# Patient Record
Sex: Male | Born: 1945 | Race: White | Hispanic: No | Marital: Married | Smoking: Former smoker
Health system: Southern US, Community
[De-identification: ages and names within clinical notes are randomized; demographics above are authoritative.]

## PROBLEM LIST (undated history)

## (undated) DIAGNOSIS — Z87442 Personal history of urinary calculi: Secondary | ICD-10-CM

## (undated) DIAGNOSIS — F419 Anxiety disorder, unspecified: Secondary | ICD-10-CM

## (undated) DIAGNOSIS — N486 Induration penis plastica: Secondary | ICD-10-CM

## (undated) DIAGNOSIS — F329 Major depressive disorder, single episode, unspecified: Secondary | ICD-10-CM

## (undated) DIAGNOSIS — M1990 Unspecified osteoarthritis, unspecified site: Secondary | ICD-10-CM

## (undated) DIAGNOSIS — K5730 Diverticulosis of large intestine without perforation or abscess without bleeding: Secondary | ICD-10-CM

## (undated) DIAGNOSIS — K635 Polyp of colon: Secondary | ICD-10-CM

## (undated) DIAGNOSIS — I10 Essential (primary) hypertension: Secondary | ICD-10-CM

## (undated) DIAGNOSIS — F32A Depression, unspecified: Secondary | ICD-10-CM

## (undated) DIAGNOSIS — M542 Cervicalgia: Secondary | ICD-10-CM

## (undated) DIAGNOSIS — N320 Bladder-neck obstruction: Secondary | ICD-10-CM

## (undated) DIAGNOSIS — M549 Dorsalgia, unspecified: Secondary | ICD-10-CM

## (undated) DIAGNOSIS — E785 Hyperlipidemia, unspecified: Secondary | ICD-10-CM

## (undated) HISTORY — PX: CIRCUMCISION: SUR203

## (undated) HISTORY — DX: Diverticulosis of large intestine without perforation or abscess without bleeding: K57.30

## (undated) HISTORY — DX: Induration penis plastica: N48.6

## (undated) HISTORY — PX: SHOULDER SURGERY: SHX246

## (undated) HISTORY — PX: COLONOSCOPY: SHX174

## (undated) HISTORY — PX: CERVICAL SPINE SURGERY: SHX589

## (undated) HISTORY — PX: NOSE SURGERY: SHX723

## (undated) HISTORY — DX: Bladder-neck obstruction: N32.0

---

## 1998-11-17 ENCOUNTER — Emergency Department (HOSPITAL_COMMUNITY): Admission: EM | Admit: 1998-11-17 | Discharge: 1998-11-18 | Payer: Self-pay | Admitting: Emergency Medicine

## 2001-03-26 ENCOUNTER — Encounter (INDEPENDENT_AMBULATORY_CARE_PROVIDER_SITE_OTHER): Payer: Self-pay | Admitting: *Deleted

## 2001-03-26 ENCOUNTER — Ambulatory Visit (HOSPITAL_COMMUNITY): Admission: RE | Admit: 2001-03-26 | Discharge: 2001-03-26 | Payer: Self-pay | Admitting: Gastroenterology

## 2002-04-24 ENCOUNTER — Emergency Department (HOSPITAL_COMMUNITY): Admission: EM | Admit: 2002-04-24 | Discharge: 2002-04-24 | Payer: Self-pay | Admitting: Emergency Medicine

## 2002-04-24 ENCOUNTER — Encounter: Payer: Self-pay | Admitting: Emergency Medicine

## 2002-05-06 ENCOUNTER — Ambulatory Visit (HOSPITAL_COMMUNITY): Admission: RE | Admit: 2002-05-06 | Discharge: 2002-05-06 | Payer: Self-pay

## 2005-01-17 ENCOUNTER — Ambulatory Visit: Payer: Self-pay

## 2005-02-27 ENCOUNTER — Ambulatory Visit (HOSPITAL_COMMUNITY): Admission: RE | Admit: 2005-02-27 | Discharge: 2005-02-27 | Payer: Self-pay | Admitting: Gastroenterology

## 2005-02-27 ENCOUNTER — Encounter (INDEPENDENT_AMBULATORY_CARE_PROVIDER_SITE_OTHER): Payer: Self-pay | Admitting: Specialist

## 2005-03-14 ENCOUNTER — Emergency Department (HOSPITAL_COMMUNITY): Admission: EM | Admit: 2005-03-14 | Discharge: 2005-03-14 | Payer: Self-pay | Admitting: Emergency Medicine

## 2007-03-06 ENCOUNTER — Ambulatory Visit (HOSPITAL_COMMUNITY): Admission: RE | Admit: 2007-03-06 | Discharge: 2007-03-06 | Payer: Self-pay

## 2009-02-23 ENCOUNTER — Encounter: Admission: RE | Admit: 2009-02-23 | Discharge: 2009-02-23 | Payer: Self-pay | Admitting: Emergency Medicine

## 2011-02-01 NOTE — Procedures (Signed)
[place]. [facility]  Patient:    Lawrence Parker, Lawrence Parker                     MRN: [id number] Proc. Date: 03/26/01 Adm. Date:  [id number] Attending:  Nelda Marseille CC:         Barron Alvine, M.D.   Procedure Report  PROCEDURE:  Colonoscopy with polypectomy.  INDICATIONS:  Family history of colon cancer and colon polyps.  Consent was signed after risks, benefits, methods, and options were thoroughly discussed in the office.  MEDICATIONS:  Demerol 100, Versed 10.  PROCEDURE:  Rectal inspection was pertinent for small external hemorrhoids. Digital examination was negative.  Video colonoscope was inserted, easily advanced around the colon to the cecum.  This did require abdominal pressure on the left side, but no position changes.  Cecum was identified by the appendiceal orifice and ileocecal valve.  No obvious abnormality was seen on insertion.  In fact, the scope inserted a short ways in the terminal ileum, which was normal.  Photo documentation was obtained.  The scope was slowly withdrawn.  Prep was adequate.  There was some liquid stool that required washing and suctioning.  On slow withdrawal through the colon, there was some scattered right and left-sided diverticuli.  As the scope was withdrawn around the colon in the more distal transverse, a tiny questionable polyp was seen and hot biopsied x 1.  At the approximate level of the sigmoid/descending junction, a 3 mm polyp was seen and hot biopsied x 2.  As the scope was withdrawn to the rectum, another tiny polyp was seen and hot biopsied x 1. They were all put in the same container.  Other than the occasional diverticuli, no other abnormalities seen.  Once back in the rectum, the scope was retroflexed, pertinent for some internal hemorrhoids.  Scope was straightened, readvanced a short ways up the sigmoid, air was suctioned, scope removed. The patient tolerated the procedure well.  There was no  obvious immediate complication.  ENDOSCOPIC DIAGNOSES: 1. Internal and external hemorrhoids. 2. Occasional right and left diverticuli. 3. Three tiny to small polyps in the transverse, sigmoid/descending junction,    and rectum, status post hot biopsy. 4. Otherwise within normal limits to the terminal ileum.  PLAN:  Await pathology to determine future colonic screening.  One week customary postpolypectomy instructions.  Happy to see back p.r.n., otherwise will recommend yearly rectals and guaiacs for preventative maintenance per primary care physician or happy to see back or mail guaiac cards if need be. DD:  03/26/01 TD:  03/26/01 Job: [id number]

## 2011-02-01 NOTE — Op Note (Signed)
Lawrence Parker, Lawrence Parker              ACCOUNT NO.:  [id number]   MEDICAL RECORD NO.:  [id number]          PATIENT TYPE:  AMB   LOCATION:  ENDO                         FACILITY:  [facility]   PHYSICIAN:  Petra Kuba, M.D.    DATE OF BIRTH:  04-04-1946   DATE OF PROCEDURE:  02/27/2005  DATE OF DISCHARGE:                                 OPERATIVE REPORT   PROCEDURE:  Colonoscopy with hot biopsy.   ENDOSCOPIST:  Petra Kuba, M.D.   INDICATIONS:  Patient with personal history of colon polyps, family history  of colon cancer, due for repeat screening.  Consent was signed after risks,  benefits, methods, and options were thoroughly discussed multiple times in  the past.   MEDICINES USED:  Demerol 70 mg, Versed 7 mg.   PROCEDURE:  Rectal inspection was pertinent for external hemorrhoids, small.  Digital exam was negative.  Video pediatric adjustable colonoscope was  inserted and fairly easily advanced around the colon to the cecum.  On  insertion in the distal transverse, a tiny polyp was seen and was hot-  biopsied x2.  To advance to the cecum did require some abdominal pressure  but no position changes.  Other than some left-sided diverticula, no other  abnormalities were seen on insertion.  The cecum was identified by the  appendiceal orifice and ileocecal valve.  The prep was fairly adequate.  He  did have some formed stool in the colon that could be washed to different  areas; some was adherent to the wall; lots of washing and suctioning were  done, but on slow withdrawal through the colon, other than a tiny polyp hot-  biopsied on insertion and the left-sided diverticula. no other abnormalities  were seen.  The polyp on withdrawal had a nice white coagulum without any  residual obvious polypoid tissue.  Anorectal pull-through and retroflexion  back in the rectum were pertinent for some small hemorrhoids.  There was  some formed stool in the rectum and distal sigmoid which could not be  suctioned.  Air was suctioned after re-advancing a short ways up the left  side of the colon and scope removed.  The patient tolerated the procedure  well.  There was no obvious immediate complication.   ENDOSCOPIC DIAGNOSES:  1.  Internal/external small hemorrhoids.  2.  Some left-sided diverticula.  3.  Distal transverse tiny polyp, hot-biopsied.  4.  Otherwise within normal limits to the cecum.   PLAN:  Await pathology.  Would probably recheck colon screening in 5 years.  Happy to see back p.r.n., otherwise return care to Dr. Vear Clock for the  customary healthcare maintenance to include yearly rectals and guaiacs.       MEM/MEDQ  D:  02/27/2005  T:  02/27/2005  Job:  [id number]   cc:   Loma Sender  [address]  [place]  [place] [address]  Fax: [phone]

## 2011-09-27 ENCOUNTER — Other Ambulatory Visit: Payer: Self-pay | Admitting: Neurological Surgery

## 2011-09-27 ENCOUNTER — Other Ambulatory Visit: Payer: Self-pay | Admitting: Internal Medicine

## 2011-09-27 DIAGNOSIS — M47812 Spondylosis without myelopathy or radiculopathy, cervical region: Secondary | ICD-10-CM

## 2011-09-27 DIAGNOSIS — M47816 Spondylosis without myelopathy or radiculopathy, lumbar region: Secondary | ICD-10-CM

## 2011-10-05 ENCOUNTER — Ambulatory Visit
Admission: RE | Admit: 2011-10-05 | Discharge: 2011-10-05 | Disposition: A | Discharge status: Home / Self Care | Payer: 59 | Source: Ambulatory Visit | Attending: Neurological Surgery | Admitting: Neurological Surgery

## 2011-10-05 DIAGNOSIS — M47816 Spondylosis without myelopathy or radiculopathy, lumbar region: Secondary | ICD-10-CM

## 2011-10-05 DIAGNOSIS — M47812 Spondylosis without myelopathy or radiculopathy, cervical region: Secondary | ICD-10-CM

## 2011-10-28 ENCOUNTER — Other Ambulatory Visit: Payer: Self-pay | Admitting: Neurological Surgery

## 2011-11-05 ENCOUNTER — Encounter (HOSPITAL_COMMUNITY): Payer: Self-pay | Admitting: Pharmacy Technician

## 2011-11-08 ENCOUNTER — Other Ambulatory Visit: Payer: Self-pay

## 2011-11-08 ENCOUNTER — Encounter (HOSPITAL_COMMUNITY): Payer: Self-pay

## 2011-11-08 ENCOUNTER — Ambulatory Visit (HOSPITAL_COMMUNITY)
Admission: RE | Admit: 2011-11-08 | Discharge: 2011-11-08 | Disposition: A | Discharge status: Home / Self Care | Payer: 59 | Source: Ambulatory Visit | Attending: Anesthesiology | Admitting: Anesthesiology

## 2011-11-08 ENCOUNTER — Encounter (HOSPITAL_COMMUNITY)
Admission: RE | Admit: 2011-11-08 | Discharge: 2011-11-08 | Disposition: A | Discharge status: Home / Self Care | Payer: 59 | Source: Ambulatory Visit | Attending: Neurological Surgery | Admitting: Neurological Surgery

## 2011-11-08 DIAGNOSIS — Z01818 Encounter for other preprocedural examination: Secondary | ICD-10-CM | POA: Insufficient documentation

## 2011-11-08 DIAGNOSIS — I10 Essential (primary) hypertension: Secondary | ICD-10-CM | POA: Insufficient documentation

## 2011-11-08 DIAGNOSIS — Z01812 Encounter for preprocedural laboratory examination: Secondary | ICD-10-CM | POA: Insufficient documentation

## 2011-11-08 HISTORY — DX: Dorsalgia, unspecified: M54.9

## 2011-11-08 HISTORY — DX: Major depressive disorder, single episode, unspecified: F32.9

## 2011-11-08 HISTORY — DX: Anxiety disorder, unspecified: F41.9

## 2011-11-08 HISTORY — DX: Essential (primary) hypertension: I10

## 2011-11-08 HISTORY — DX: Hyperlipidemia, unspecified: E78.5

## 2011-11-08 HISTORY — DX: Personal history of urinary calculi: Z87.442

## 2011-11-08 HISTORY — DX: Depression, unspecified: F32.A

## 2011-11-08 HISTORY — DX: Cervicalgia: M54.2

## 2011-11-08 HISTORY — DX: Polyp of colon: K63.5

## 2011-11-08 HISTORY — DX: Unspecified osteoarthritis, unspecified site: M19.90

## 2011-11-08 LAB — SURGICAL PCR SCREEN
MRSA, PCR: NEGATIVE
Staphylococcus aureus: NEGATIVE

## 2011-11-08 LAB — CBC
MCH: 28.3 pg (ref 26.0–34.0)
Platelets: 149 10*3/uL — ABNORMAL LOW (ref 150–400)
RBC: 5.01 MIL/uL (ref 4.22–5.81)

## 2011-11-08 LAB — BASIC METABOLIC PANEL
Calcium: 9.8 mg/dL (ref 8.4–10.5)
GFR calc non Af Amer: >90 mL/min (ref >90)
Glucose, Bld: 354 mg/dL — ABNORMAL HIGH (ref 70–99)
Sodium: 133 mEq/L — ABNORMAL LOW (ref 135–145)

## 2011-11-08 NOTE — Progress Notes (Signed)
Pt doesn't have a cardiologist;maintained by medical MD Dr.Charles Vear Clock in [place]  For HTN/hyperlipidemia  Denies having a stress test/echo/heart cath

## 2011-11-08 NOTE — Progress Notes (Signed)
Fasting sugar runs about 110

## 2011-11-08 NOTE — Pre-Procedure Instructions (Signed)
20 KIP CROPP  11/08/2011   Your procedure is scheduled on:  Fri, Mar 1 @ 0730  Report to [facility] at 0530 AM.  Call this number if you have problems the morning of surgery: [phone]   Remember:   Do not eat food:After Midnight.  May have clear liquids: up to 4 Hours before arrival.  Clear liquids include soda, tea, black coffee, apple or grape juice, broth.  Take these medicines the morning of surgery with A SIP OF WATER: Xanax and Amolodipine   Do not wear jewelry, make-up or nail polish.  Do not wear lotions, powders, or perfumes. You may wear deodorant.  Do not shave 48 hours prior to surgery.  Do not bring valuables to the hospital.  Contacts, dentures or bridgework may not be worn into surgery.  Leave suitcase in the car. After surgery it may be brought to your room.  For patients admitted to the hospital, checkout time is 11:00 AM the day of discharge.   Patients discharged the day of surgery will not be allowed to drive home.  Name and phone number of your driver:   Special Instructions: CHG Shower Use Special Wash: 1/2 bottle night before surgery and 1/2 bottle morning of surgery.   Please read over the following fact sheets that you were given: Pain Booklet, Coughing and Deep Breathing, MRSA Information and Surgical Site Infection Prevention

## 2011-11-14 MED ORDER — CEFAZOLIN SODIUM-DEXTROSE 2-3 GM-% IV SOLR
2.0000 g | INTRAVENOUS | Status: AC
  Administered 2011-11-15: 2 g via INTRAVENOUS
  Filled 2011-11-14: qty 50

## 2011-11-15 ENCOUNTER — Encounter (HOSPITAL_COMMUNITY): Payer: Self-pay | Admitting: Certified Registered"

## 2011-11-15 ENCOUNTER — Encounter (HOSPITAL_COMMUNITY)
Admission: RE | Disposition: A | Discharge status: Home / Self Care | Payer: Self-pay | Source: Ambulatory Visit | Attending: Neurological Surgery

## 2011-11-15 ENCOUNTER — Encounter (HOSPITAL_COMMUNITY): Payer: Self-pay | Admitting: Anesthesiology

## 2011-11-15 ENCOUNTER — Encounter (HOSPITAL_COMMUNITY): Payer: Self-pay | Admitting: *Deleted

## 2011-11-15 ENCOUNTER — Ambulatory Visit (HOSPITAL_COMMUNITY): Payer: 59 | Admitting: Certified Registered"

## 2011-11-15 ENCOUNTER — Ambulatory Visit (HOSPITAL_COMMUNITY): Payer: 59

## 2011-11-15 ENCOUNTER — Inpatient Hospital Stay (HOSPITAL_COMMUNITY)
Admission: RE | Admit: 2011-11-15 | Discharge: 2011-11-16 | DRG: 473 | Disposition: A | Discharge status: Home / Self Care | Payer: 59 | Source: Ambulatory Visit | Attending: Neurological Surgery | Admitting: Neurological Surgery

## 2011-11-15 DIAGNOSIS — E119 Type 2 diabetes mellitus without complications: Secondary | ICD-10-CM | POA: Diagnosis present

## 2011-11-15 DIAGNOSIS — Z794 Long term (current) use of insulin: Secondary | ICD-10-CM

## 2011-11-15 DIAGNOSIS — Z7982 Long term (current) use of aspirin: Secondary | ICD-10-CM

## 2011-11-15 DIAGNOSIS — M4802 Spinal stenosis, cervical region: Secondary | ICD-10-CM | POA: Diagnosis present

## 2011-11-15 DIAGNOSIS — M4712 Other spondylosis with myelopathy, cervical region: Principal | ICD-10-CM | POA: Diagnosis present

## 2011-11-15 LAB — GLUCOSE, CAPILLARY: Glucose-Capillary: 129 mg/dL — ABNORMAL HIGH (ref 70–99)

## 2011-11-15 SURGERY — ANTERIOR CERVICAL DECOMPRESSION/DISCECTOMY FUSION 4 LEVELS
Anesthesia: General | Site: Neck | Wound class: Clean

## 2011-11-15 MED ORDER — 0.9 % SODIUM CHLORIDE (POUR BTL) OPTIME
TOPICAL | Status: DC | PRN
  Administered 2011-11-15: 1000 mL

## 2011-11-15 MED ORDER — PHENYLEPHRINE HCL 10 MG/ML IJ SOLN
INTRAMUSCULAR | Status: DC | PRN
  Administered 2011-11-15: 80 ug via INTRAVENOUS
  Administered 2011-11-15 (×2): 40 ug via INTRAVENOUS
  Administered 2011-11-15 (×2): 80 ug via INTRAVENOUS

## 2011-11-15 MED ORDER — SODIUM CHLORIDE 0.9 % IJ SOLN
3.0000 mL | Freq: Two times a day (BID) | INTRAMUSCULAR | Status: DC
  Administered 2011-11-15: 3 mL via INTRAVENOUS

## 2011-11-15 MED ORDER — SIMVASTATIN 20 MG PO TABS
20.0000 mg | ORAL_TABLET | Freq: Every evening | ORAL | Status: DC
  Administered 2011-11-15: 20 mg via ORAL
  Filled 2011-11-15 (×2): qty 1

## 2011-11-15 MED ORDER — HYDROCHLOROTHIAZIDE 25 MG PO TABS
25.0000 mg | ORAL_TABLET | Freq: Every day | ORAL | Status: DC
  Administered 2011-11-15 – 2011-11-16 (×2): 25 mg via ORAL
  Filled 2011-11-15 (×2): qty 1

## 2011-11-15 MED ORDER — ROCURONIUM BROMIDE 100 MG/10ML IV SOLN
INTRAVENOUS | Status: DC | PRN
  Administered 2011-11-15: 50 mg via INTRAVENOUS

## 2011-11-15 MED ORDER — ALUM & MAG HYDROXIDE-SIMETH 200-200-20 MG/5ML PO SUSP: 30.0000 mL | Freq: Four times a day (QID) | ORAL | Status: DC | PRN

## 2011-11-15 MED ORDER — ONDANSETRON HCL 4 MG/2ML IJ SOLN: 4.0000 mg | Freq: Once | INTRAMUSCULAR | Status: DC | PRN

## 2011-11-15 MED ORDER — LACTATED RINGERS IV SOLN
INTRAVENOUS | Status: DC | PRN
  Administered 2011-11-15 (×4): via INTRAVENOUS

## 2011-11-15 MED ORDER — LIDOCAINE HCL (CARDIAC) 20 MG/ML IV SOLN
INTRAVENOUS | Status: DC | PRN
  Administered 2011-11-15: 100 mg via INTRAVENOUS

## 2011-11-15 MED ORDER — GLYCOPYRROLATE 0.2 MG/ML IJ SOLN
INTRAMUSCULAR | Status: DC | PRN
  Administered 2011-11-15: .4 mg via INTRAVENOUS

## 2011-11-15 MED ORDER — BACITRACIN 50000 UNITS IM SOLR
INTRAMUSCULAR | Status: AC
  Filled 2011-11-15: qty 1

## 2011-11-15 MED ORDER — MORPHINE SULFATE 4 MG/ML IJ SOLN: 1.0000 mg | INTRAMUSCULAR | Status: DC | PRN

## 2011-11-15 MED ORDER — SODIUM CHLORIDE 0.9 % IV SOLN: 250.0000 mL | INTRAVENOUS | Status: DC

## 2011-11-15 MED ORDER — HYDROMORPHONE HCL PF 1 MG/ML IJ SOLN
INTRAMUSCULAR | Status: AC
  Filled 2011-11-15: qty 1

## 2011-11-15 MED ORDER — ACETAMINOPHEN 325 MG PO TABS: 650.0000 mg | ORAL_TABLET | ORAL | Status: DC | PRN

## 2011-11-15 MED ORDER — PIOGLITAZONE HCL 15 MG PO TABS
15.0000 mg | ORAL_TABLET | Freq: Every day | ORAL | Status: DC
  Administered 2011-11-15 – 2011-11-16 (×2): 15 mg via ORAL
  Filled 2011-11-15 (×2): qty 1

## 2011-11-15 MED ORDER — ONDANSETRON HCL 4 MG/2ML IJ SOLN
INTRAMUSCULAR | Status: DC | PRN
  Administered 2011-11-15: 4 mg via INTRAVENOUS

## 2011-11-15 MED ORDER — ONDANSETRON HCL 4 MG/2ML IJ SOLN: 4.0000 mg | INTRAMUSCULAR | Status: DC | PRN

## 2011-11-15 MED ORDER — PHENOL 1.4 % MT LIQD: 1.0000 | OROMUCOSAL | Status: DC | PRN

## 2011-11-15 MED ORDER — ACETAMINOPHEN 650 MG RE SUPP: 650.0000 mg | RECTAL | Status: DC | PRN

## 2011-11-15 MED ORDER — MEPERIDINE HCL 25 MG/ML IJ SOLN: 6.2500 mg | INTRAMUSCULAR | Status: DC | PRN

## 2011-11-15 MED ORDER — MORPHINE SULFATE 2 MG/ML IJ SOLN: 4.0000 mg | INTRAMUSCULAR | Status: DC | PRN

## 2011-11-15 MED ORDER — NEOSTIGMINE METHYLSULFATE 1 MG/ML IJ SOLN
INTRAMUSCULAR | Status: DC | PRN
  Administered 2011-11-15: 3 mg via INTRAVENOUS

## 2011-11-15 MED ORDER — AMLODIPINE BESYLATE 10 MG PO TABS
10.0000 mg | ORAL_TABLET | Freq: Every day | ORAL | Status: DC
  Administered 2011-11-16: 10 mg via ORAL
  Filled 2011-11-15 (×2): qty 1

## 2011-11-15 MED ORDER — SODIUM CHLORIDE 0.9 % IJ SOLN: 3.0000 mL | INTRAMUSCULAR | Status: DC | PRN

## 2011-11-15 MED ORDER — METFORMIN HCL 500 MG PO TABS
1000.0000 mg | ORAL_TABLET | Freq: Two times a day (BID) | ORAL | Status: DC
  Administered 2011-11-15: 1000 mg via ORAL
  Administered 2011-11-16: 500 mg via ORAL
  Administered 2011-11-16: 1000 mg via ORAL
  Filled 2011-11-15 (×5): qty 2

## 2011-11-15 MED ORDER — GLIPIZIDE ER 10 MG PO TB24
20.0000 mg | ORAL_TABLET | Freq: Every day | ORAL | Status: DC
  Administered 2011-11-15 – 2011-11-16 (×2): 20 mg via ORAL
  Filled 2011-11-15 (×2): qty 2

## 2011-11-15 MED ORDER — FENTANYL CITRATE 0.05 MG/ML IJ SOLN
INTRAMUSCULAR | Status: DC | PRN
  Administered 2011-11-15: 50 ug via INTRAVENOUS
  Administered 2011-11-15: 100 ug via INTRAVENOUS

## 2011-11-15 MED ORDER — PNEUMOCOCCAL VAC POLYVALENT 25 MCG/0.5ML IJ INJ
0.5000 mL | INJECTION | INTRAMUSCULAR | Status: AC
Start: 2011-11-16 — End: 2011-11-16
  Administered 2011-11-16: 0.5 mL via INTRAMUSCULAR
  Filled 2011-11-15: qty 0.5

## 2011-11-15 MED ORDER — DEXAMETHASONE SODIUM PHOSPHATE 4 MG/ML IJ SOLN
INTRAMUSCULAR | Status: DC | PRN
  Administered 2011-11-15: 10 mg via INTRAVENOUS

## 2011-11-15 MED ORDER — SODIUM CHLORIDE 0.9 % IV SOLN
INTRAVENOUS | Status: AC
  Filled 2011-11-15: qty 500

## 2011-11-15 MED ORDER — MENTHOL 3 MG MT LOZG
1.0000 | LOZENGE | OROMUCOSAL | Status: DC | PRN
  Filled 2011-11-15: qty 9

## 2011-11-15 MED ORDER — LIDOCAINE-EPINEPHRINE 1 %-1:100000 IJ SOLN
INTRAMUSCULAR | Status: DC | PRN
  Administered 2011-11-15: 5 mL

## 2011-11-15 MED ORDER — BUPIVACAINE HCL (PF) 0.5 % IJ SOLN
INTRAMUSCULAR | Status: DC | PRN
  Administered 2011-11-15: 5 mL

## 2011-11-15 MED ORDER — SODIUM CHLORIDE 0.9 % IR SOLN
Status: DC | PRN
  Administered 2011-11-15: 09:00:00

## 2011-11-15 MED ORDER — VECURONIUM BROMIDE 10 MG IV SOLR
INTRAVENOUS | Status: DC | PRN
  Administered 2011-11-15 (×4): 2 mg via INTRAVENOUS

## 2011-11-15 MED ORDER — PROPOFOL 10 MG/ML IV EMUL
INTRAVENOUS | Status: DC | PRN
  Administered 2011-11-15: 120 mg via INTRAVENOUS

## 2011-11-15 MED ORDER — MIDAZOLAM HCL 5 MG/5ML IJ SOLN
INTRAMUSCULAR | Status: DC | PRN
  Administered 2011-11-15: 2 mg via INTRAVENOUS

## 2011-11-15 MED ORDER — INSULIN ASPART 100 UNIT/ML ~~LOC~~ SOLN
0.0000 [IU] | Freq: Three times a day (TID) | SUBCUTANEOUS | Status: DC
  Administered 2011-11-15: 7 [IU] via SUBCUTANEOUS
  Administered 2011-11-16: 4 [IU] via SUBCUTANEOUS
  Administered 2011-11-16: 7 [IU] via SUBCUTANEOUS
  Filled 2011-11-15: qty 3

## 2011-11-15 MED ORDER — OXYCODONE-ACETAMINOPHEN 5-325 MG PO TABS
1.0000 | ORAL_TABLET | ORAL | Status: DC | PRN
  Administered 2011-11-15: 2 via ORAL
  Administered 2011-11-15: 1 via ORAL
  Administered 2011-11-15 – 2011-11-16 (×4): 2 via ORAL
  Filled 2011-11-15 (×6): qty 2

## 2011-11-15 MED ORDER — INSULIN GLARGINE 100 UNIT/ML ~~LOC~~ SOLN
30.0000 [IU] | Freq: Every day | SUBCUTANEOUS | Status: DC
  Administered 2011-11-15: 30 [IU] via SUBCUTANEOUS
  Filled 2011-11-15: qty 3

## 2011-11-15 MED ORDER — DIAZEPAM 5 MG PO TABS
5.0000 mg | ORAL_TABLET | Freq: Four times a day (QID) | ORAL | Status: DC | PRN
  Administered 2011-11-15 – 2011-11-16 (×4): 5 mg via ORAL
  Filled 2011-11-15 (×4): qty 1

## 2011-11-15 MED ORDER — HYDROMORPHONE HCL PF 1 MG/ML IJ SOLN
0.2500 mg | INTRAMUSCULAR | Status: DC | PRN
  Administered 2011-11-15 (×3): 0.5 mg via INTRAVENOUS

## 2011-11-15 MED ORDER — ALPRAZOLAM 0.5 MG PO TABS: 0.5000 mg | ORAL_TABLET | Freq: Two times a day (BID) | ORAL | Status: DC | PRN

## 2011-11-15 MED ORDER — THROMBIN 20000 UNITS EX KIT
PACK | CUTANEOUS | Status: DC | PRN
  Administered 2011-11-15: 09:00:00 via TOPICAL

## 2011-11-15 SURGICAL SUPPLY — 65 items
ADH SKN CLS APL DERMABOND .7 (GAUZE/BANDAGES/DRESSINGS) ×1
BAG DECANTER FOR FLEXI CONT (MISCELLANEOUS) ×2 IMPLANT
BANDAGE GAUZE ELAST BULKY 4 IN (GAUZE/BANDAGES/DRESSINGS) IMPLANT
BIT DRILL 14MM (INSTRUMENTS) IMPLANT
BIT DRILL NEURO 2X3.1 SFT TUCH (MISCELLANEOUS) ×1 IMPLANT
BONE CERVICAL LRG 7MM (Spacer) ×1 IMPLANT
BUR BARREL STRAIGHT FLUTE 4.0 (BURR) ×2 IMPLANT
CAGE CERVICAL TRANZGRAFT 7MM (Cage) ×3 IMPLANT
CANISTER SUCTION 2500CC (MISCELLANEOUS) ×2 IMPLANT
CLOTH BEACON ORANGE TIMEOUT ST (SAFETY) ×2 IMPLANT
CONT SPEC 4OZ CLIKSEAL STRL BL (MISCELLANEOUS) ×3 IMPLANT
DECANTER SPIKE VIAL GLASS SM (MISCELLANEOUS) ×1 IMPLANT
DERMABOND ADVANCED (GAUZE/BANDAGES/DRESSINGS) ×1
DERMABOND ADVANCED .7 DNX12 (GAUZE/BANDAGES/DRESSINGS) ×1 IMPLANT
DRAPE LAPAROTOMY 100X72 PEDS (DRAPES) ×2 IMPLANT
DRAPE MICROSCOPE LEICA (MISCELLANEOUS) IMPLANT
DRAPE POUCH INSTRU U-SHP 10X18 (DRAPES) ×2 IMPLANT
DRESSING TELFA 8X3 (GAUZE/BANDAGES/DRESSINGS) ×1 IMPLANT
DRILL 14MM (INSTRUMENTS) ×2
DRILL NEURO 2X3.1 SOFT TOUCH (MISCELLANEOUS) ×2
DRSG OPSITE 4X5.5 SM (GAUZE/BANDAGES/DRESSINGS) ×1 IMPLANT
DURAPREP 6ML APPLICATOR 50/CS (WOUND CARE) ×2 IMPLANT
ELECT REM PT RETURN 9FT ADLT (ELECTROSURGICAL) ×2
ELECTRODE REM PT RTRN 9FT ADLT (ELECTROSURGICAL) ×1 IMPLANT
GAUZE SPONGE 4X4 16PLY XRAY LF (GAUZE/BANDAGES/DRESSINGS) ×1 IMPLANT
GLOVE BIO SURGEON STRL SZ7.5 (GLOVE) IMPLANT
GLOVE BIOGEL PI IND STRL 7.5 (GLOVE) IMPLANT
GLOVE BIOGEL PI IND STRL 8 (GLOVE) IMPLANT
GLOVE BIOGEL PI IND STRL 8.5 (GLOVE) ×1 IMPLANT
GLOVE BIOGEL PI INDICATOR 7.5 (GLOVE) ×1
GLOVE BIOGEL PI INDICATOR 8 (GLOVE) ×1
GLOVE BIOGEL PI INDICATOR 8.5 (GLOVE) ×1
GLOVE ECLIPSE 7.5 STRL STRAW (GLOVE) ×3 IMPLANT
GLOVE ECLIPSE 8.5 STRL (GLOVE) ×3 IMPLANT
GLOVE EXAM NITRILE LRG STRL (GLOVE) IMPLANT
GLOVE EXAM NITRILE MD LF STRL (GLOVE) ×1 IMPLANT
GLOVE EXAM NITRILE XL STR (GLOVE) IMPLANT
GLOVE EXAM NITRILE XS STR PU (GLOVE) IMPLANT
GOWN BRE IMP SLV AUR LG STRL (GOWN DISPOSABLE) IMPLANT
GOWN BRE IMP SLV AUR XL STRL (GOWN DISPOSABLE) ×2 IMPLANT
GOWN STRL REIN 2XL LVL4 (GOWN DISPOSABLE) ×3 IMPLANT
HEAD HALTER (SOFTGOODS) ×2 IMPLANT
KIT BASIN OR (CUSTOM PROCEDURE TRAY) ×2 IMPLANT
KIT ROOM TURNOVER OR (KITS) ×2 IMPLANT
NDL SPNL 22GX3.5 QUINCKE BK (NEEDLE) ×1 IMPLANT
NEEDLE HYPO 22GX1.5 SAFETY (NEEDLE) ×2 IMPLANT
NEEDLE SPNL 22GX3.5 QUINCKE BK (NEEDLE) ×2 IMPLANT
NS IRRIG 1000ML POUR BTL (IV SOLUTION) ×2 IMPLANT
PACK LAMINECTOMY NEURO (CUSTOM PROCEDURE TRAY) ×2 IMPLANT
PAD ARMBOARD 7.5X6 YLW CONV (MISCELLANEOUS) ×6 IMPLANT
PATTIES SURGICAL .5 X.5 (GAUZE/BANDAGES/DRESSINGS) ×1 IMPLANT
PATTIES SURGICAL 1X1 (DISPOSABLE) ×1 IMPLANT
PLATE 72MM (Plate) ×1 IMPLANT
PUTTY DBM 5CC ×1 IMPLANT
RUBBERBAND STERILE (MISCELLANEOUS) IMPLANT
SCREW 14MM (Screw) ×10 IMPLANT
SPONGE INTESTINAL PEANUT (DISPOSABLE) ×2 IMPLANT
SPONGE SURGIFOAM ABS GEL 100 (HEMOSTASIS) ×1 IMPLANT
SPONGE SURGIFOAM ABS GEL SZ50 (HEMOSTASIS) ×1 IMPLANT
SUT VIC AB 3-0 SH 8-18 (SUTURE) ×2 IMPLANT
SYR 20ML ECCENTRIC (SYRINGE) ×2 IMPLANT
TOWEL OR 17X24 6PK STRL BLUE (TOWEL DISPOSABLE) ×2 IMPLANT
TOWEL OR 17X26 10 PK STRL BLUE (TOWEL DISPOSABLE) ×2 IMPLANT
TRAY FOLEY CATH 14FRSI W/METER (CATHETERS) ×1 IMPLANT
WATER STERILE IRR 1000ML POUR (IV SOLUTION) ×2 IMPLANT

## 2011-11-15 NOTE — H&P (Signed)
11/15/11 No change to history or physical findings since 10/24/11 Lawrence Parker  #[id number] DOB:  Feb 03, 1946 10/24/2011:  [name] returns to the office today having had an MRI of the cervical and lumbar spines. I had the opportunity to review both these studies with him and I demonstrated the findings.  The cervical spine demonstrates a critical stenosis of his cervical spinal cord starting at C3-4 with an AP diameter of the canal that measures down to 5 mm. and a large extrusion of the disc in the subligamentous space.  At C4-5 this process is recapitulated with his AP diameter measuring 5.8 mm.  The process continues at C5-6 and again at C6-7 where there is not quite as severe a spinal cord compression, but again it is quite advanced spondylitic changes with central and biforaminal stenosis.    I indicated to [name] that the stenosis is rather critical at this level and on some of the axial images one can see the start of some intrinsic cord change.  I noted to Mr. Eads that this process should be decompressed and I discussed the surgical intervention for it. It would require a four-level anterior decompression with discectomies at C3-4, 4-5, 5-6, and 6-7.  I indicated that we would do this from in front of his neck by removing the discs, placing bone grafts into the disc space and then placing a Titanium plate from C3 to C7 to stabilize his neck.  This is a substantial undertaking.  I noted to him the greatest risks. The things that I am most concerned about is his ability to heal. He does have two risk factors; age over 85 and the fact that he is diabetic. He had been a long time smoker in the past, but he is currently not smoking, so I do not believe that this plays a significant role as a risk factor, but it may play a role in the degree and severity of the degenerative changes that he has.    In any event, I indicated the importance of this being decompressed and stabilized.  I do believe that his  range of motion, which is poor already, should not be any worse, but hopefully he will be able to move in a more pain-free fashion once this process heals.  I discussed with him also the major risks of the surgery, which includes the potential for injury to the voice box or the esophagus, which could cause some swallowing difficulties, and the other risk of course to the spinal cord and the nerve roots. Beyond that, my biggest concern is that he heals this surgery properly.    We also discussed and reviewed the lumbar spine MRI.  That study demonstrates that he has progressive stenosis, that is narrowing of his spinal canal, starting at L2-3, 3-4, and 4-5, with 4-5 being the most critical.  This, however, is not nearly as severe as what we see in the cervical spine where he has compression and flattening of the spinal cord.  I indicated that we should put his back on the back burner for the current time and deal with the most pressing problem which is in the cervical spine. We will plan on scheduling the surgery at his earliest convenience.  I indicated that the surgery would require typically a two-day hospital stay.  Patients afterwards can have a soft collar to wear for comfort, but are not required to wear this and afterwards patients are restricted from any significant activity for a  period of about eight weeks and he may be able to return to work that early, but not likely sooner than that.           Stefani Dama, M.D./[facility] Lawrence Parker #[id number] DOB: 13-Mar-1946 January 10, 2010 HISTORY OF PRESENT iLLNESS: [name] presents to the office today with significant difficulties with back pain, leg pain, and loss of stamina in his back and his legs. [name] has been seen and evaluated by Dr. 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 He notes that in the past number of months and year or two, he has had increasing difficulty with the  stamina in his back and his legs. He notes that he gets pain,. 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 The worst three levels at the current time would be L3-4, L4-5, and L5-S1. He has central canal stenosis, worse at the L4-5 level with lateral recess stenosis at that level. He also has some significant spondylitic changes at L3 -4 with bilateral lateral recess stenosis at that level. He does however also have some significant spondylosis at L2-3 and there is some lateral recess stenosis at that level. Ll-2 also shows a milder form of the same degenerative process. The alignment of his spine is quite adequate. A note has been made of a mild grade 1 anterolisthesis of L4 and L5. Clinically, the patient notes that he can walk comfortably. 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 PAST MEDICAL HISTORY: Reveals that his general health has been good. He does, however, have some diabetes. He does have some high blood pressure. ALLERGIES: . HE NOTES THAT HE IS NOT ALLERGIC TO ANY MEDICATIONS. SOCIAL HISTORY: He does not smoke nor does he use any alcohol. His height and weight have been stable at 213 pounds and 6 feet 2 inches. REVIEW OF SYSTEMS: His systems review is notable for wearing glasses, high blood pressure, swelling in the feet and hands, leg pain while walking, arm weakness, leg weakness, back pain, leg pain, joint pain, swelling, arthritis, and neck  pain. RADIOGRAPHS: Today in the office, the patient also had some plain radiographs of the cervical spine, which demonstrate that he has some advanced spondylitic changes in multiple levels of the neck, most notably at C4-5, C5-6, and C6-7 with large ventral osteophytes. He does have some straightening in the midportion of his cervical spine also. Plain radiographs in our office today demonstrate that there is minimal evidence for spondylolisthesis at the level of L4-5 and motion between flexion and extension is quite normal. NEUROLOGICAL EXAMINATION: His neurologic exam today per Darl Pikes indicates that his motor strength is good in the proximal and distal lower extremities, deep tendon reflexes are 2t in the patella and Achilles, and the Babinski 's are downgoing, straight leg raising is negative to 80 degrees bilaterally, and Patrick's maneuver is negative also. IMPRESSION: I have reviewed the MRI with the patient, discussed the findings,  and note that it in terms of the patient's expectations that is a relief of his back pain and his leg symptoms. I believe that a multilevel lumbar decompression and fusion is not likely to provide this for him. I believe that this process is likely to cause some permanent aggravation for him and I suggested that it will be best to treat his condition conservatively. He may be better served with some lifestyle changes including a regular exercise program for his back and his legs, avoidance of significant bending, lifting, and stooping as oppose to considering major surgical decompression arthrodesis, which is indeed going to have limitations of his own in terms of his activity levels. I believe that he can tolerate a light to medium workload, but I am not of the opinion that surgical intervention for the process as he currently is described with his current neurologic findings is going to yield him a better level of function for a long tea We can continue to follow him  conservatively. I discussed this with the patient today and he will consider appropriate alterations as needed. He notes that he has been working for the same company for 42 years and lately there have been some rumor of changes coming to his work situation, which he may need to take into consideration carefully. We will remain available to see him as needed. [facility] Purvis Kilts, M.D., E.A.C.S.

## 2011-11-15 NOTE — Anesthesia Postprocedure Evaluation (Signed)
Anesthesia Post Note  Patient: Lawrence Parker  Procedure(s) Performed: Procedure(s) (LRB): ANTERIOR CERVICAL DECOMPRESSION/DISCECTOMY FUSION 4 LEVELS (N/A)  Anesthesia type: general  Patient location: PACU  Post pain: Pain level controlled  Post assessment: Patient's Cardiovascular Status Stable  Last Vitals:  Filed Vitals:   11/15/11 1215  BP: 170/73  Pulse: 88  Temp:   Resp: 15    Post vital signs: Reviewed and stable  Level of consciousness: sedated  Complications: No apparent anesthesia complications

## 2011-11-15 NOTE — Anesthesia Preprocedure Evaluation (Addendum)
Anesthesia Evaluation  Patient identified by MRN, date of birth, ID band Patient awake    Reviewed: Allergy & Precautions, H&P , NPO status , Patient's Chart, lab work & pertinent test results, reviewed documented beta blocker date and time   Airway Mallampati: II TM Distance: >3 FB Neck ROM: Full    Dental  (+) Partial Upper, Partial Lower and Dental Advisory Given   Pulmonary          Cardiovascular hypertension, Pt. on medications     Neuro/Psych PSYCHIATRIC DISORDERS Anxiety Depression    GI/Hepatic   Endo/Other  Diabetes mellitus-, Poorly Controlled, Type 2, Oral Hypoglycemic Agents  Renal/GU      Musculoskeletal   Abdominal   Peds  Hematology   Anesthesia Other Findings   Reproductive/Obstetrics                           Anesthesia Physical Anesthesia Plan  ASA: II  Anesthesia Plan: General   Post-op Pain Management:    Induction: Intravenous  Airway Management Planned: Oral ETT  Additional Equipment:   Intra-op Plan:   Post-operative Plan: Extubation in OR  Informed Consent: I have reviewed the patients History and Physical, chart, labs and discussed the procedure including the risks, benefits and alternatives for the proposed anesthesia with the patient or authorized representative who has indicated his/her understanding and acceptance.     Plan Discussed with: Surgeon and CRNA  Anesthesia Plan Comments:       Anesthesia Quick Evaluation

## 2011-11-15 NOTE — Progress Notes (Signed)
Orthopedic Tech Progress Note Patient Details:  Lawrence Parker November 07, 1945 [id number]  Other Ortho Devices Type of Ortho Device: Other (comment) (soft c-collar) Ortho Device Interventions: Application   Jennye Moccasin 11/15/2011, 7:37 PM

## 2011-11-15 NOTE — Preoperative (Signed)
Beta Blockers   Reason not to administer Beta Blockers:Not Applicable 

## 2011-11-15 NOTE — Progress Notes (Signed)
Patient ID: Lawrence Parker, male   DOB: 02-04-1946, 66 y.o.   MRN: [id number] Incision is clean and dry. Patient is able to swallow without difficulty. Patient has not voided yet secondary to Foley catheter which will be removed this evening. Plan is to mobilize the patient and if stable in the morning discharge home. Patient denies tingling or dysesthesias in the upper extremities.

## 2011-11-15 NOTE — Op Note (Signed)
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 Asst.: Barbaraann Barthel M.D. Indications: Patient is a 66 year old individual was had significant problems with neck shoulder and arm pain including dysesthesias into the fingertips for a long period time. He also started to develop difficulties with his back and his lower extremities and this prompted a workup including a cervical MRI. The MRI demonstrated that the patient had severe spinal stenosis with the AP diameter of his cervical spinal canal measuring 5 mm at C3-4 and C4-5. He also had advanced spondylosis at C5-6 and C6-C7 and is now taken to the operating room to undergo decompression and arthrodesis C3-C7 area  Procedure: The patient was brought to the operating room placed on the table in supine position. After the smooth induction of general endotracheal anesthesia neck was placed in 5 pounds of halter traction and prepped with alcohol and DuraPrep. After sterile draping and appropriate timeout procedure a transverse incision was created in the left side of the neck and carried down to the platysma. The plane between the sternocleidomastoid and strap muscles dissected bluntly until the prevertebral space was reached. The first identifiable disc space was noted to be C4-C5 on a localizing radiograph. The dissection was then undertaken in the longus coli muscle to allow placement of a self-retaining Caspar type retractor.  The anterior longitudinal ligament was opened at C4-C5 and ventral osteophytes were removed with a Leksell rongeur and Kerrison punch. Interspace was cleared of significant  quantity of the degenerated disc material in the region of the posterior longitudinal ligament was reached. Dissection was carried out using a high-speed drill and 3-0 Karlin curettes. Uncinate processes were drilled down and removed and osteophytes from the inferior margin of the body of C4 were removed with a Kerrison 2 mm gold punch. After the central canal and lateral recesses were well decompressed the stasis was achieved with the bipolar cautery and some small pledgets of Gelfoam soaked in thrombin that were later irrigated away.  A 7 mm transgraft was then prepared by enlarging the central cavity and filling with demineralized bone matrix and placing into the interspace. C3-4 Was decompressed and fused in a similar fashion. C5-6 Was then decompressed and also fused in a similar fashion. C6-C7 was also decompressed and fused in a similar fashion. Large ventral osteophytes were removed from the entirety of the cervical spine C3-C7 to provide a smooth surface for placement of an anterior plate  Next the retractor was removed and a 72 mm trestle plate was placed over the vertebral bodies and secured with 14 mm variable angle screws. A final localizing radiograph identified the position of the surgical construct. The stasis was achieved in the soft tissues and then the platysma was closed with 3-0 Vicryl in an interrupted fashion and 3-0 Vicryl was used in the subcuticular tissue. Blood loss was estimated at 250 cc

## 2011-11-15 NOTE — Anesthesia Procedure Notes (Signed)
Procedure Name: Intubation Date/Time: 11/15/2011 8:00 AM Performed by: Einar Crow Pre-anesthesia Checklist: Patient identified, Emergency Drugs available, Suction available and Patient being monitored Patient Re-evaluated:Patient Re-evaluated prior to inductionOxygen Delivery Method: Circle system utilized Preoxygenation: Pre-oxygenation with 100% oxygen Intubation Type: IV induction Ventilation: Mask ventilation without difficulty and Oral airway inserted - appropriate to patient size Laryngoscope Size: Mac and 4 Grade View: Grade I Tube type: Oral Tube size: 7.5 mm Number of attempts: 1 Airway Equipment and Method: Stylet Placement Confirmation: ETT inserted through vocal cords under direct vision,  positive ETCO2 and breath sounds checked- equal and bilateral Secured at: 23 cm Tube secured with: Tape Dental Injury: Teeth and Oropharynx as per pre-operative assessment

## 2011-11-15 NOTE — Transfer of Care (Signed)
Immediate Anesthesia Transfer of Care Note  Patient: Lawrence Parker  Procedure(s) Performed: Procedure(s) (LRB): ANTERIOR CERVICAL DECOMPRESSION/DISCECTOMY FUSION 4 LEVELS (N/A)  Patient Location: PACU  Anesthesia Type: General  Level of Consciousness: awake, alert , oriented and patient cooperative  Airway & Oxygen Therapy: Patient Spontanous Breathing and Patient connected to nasal cannula oxygen  Post-op Assessment: Report given to PACU RN, Post -op Vital signs reviewed and stable and Patient moving all extremities X 4  Post vital signs: Reviewed and stable  Complications: No apparent anesthesia complications

## 2011-11-16 LAB — GLUCOSE, CAPILLARY
Glucose-Capillary: 286 mg/dL — ABNORMAL HIGH (ref 70–99)
Glucose-Capillary: 211 mg/dL — ABNORMAL HIGH (ref 70–99)
Glucose-Capillary: 190 mg/dL — ABNORMAL HIGH (ref 70–99)

## 2011-11-16 MED ORDER — OXYCODONE-ACETAMINOPHEN 5-325 MG PO TABS: 1.0000 | ORAL_TABLET | ORAL | Status: AC | PRN

## 2011-11-16 MED ORDER — DIAZEPAM 5 MG PO TABS: 5.0000 mg | ORAL_TABLET | Freq: Four times a day (QID) | ORAL | Status: AC | PRN

## 2011-11-16 NOTE — Evaluation (Signed)
Physical Therapy Evaluation Patient Details Name: Lawrence Parker MRN: [id number] DOB: 04/11/1946 Today's Date: 11/16/2011  Problem List: There is no problem list on file for this patient.   Past Medical History:  Past Medical History  Diagnosis Date  . Hypertension     takes Amlodipine daily and HCTZ daily  . Hyperlipidemia     takes Zocor daily  . Neck pain     cervical spondylosis  . Arthritis     neck  . Back pain     deteriorating disc  . Colon polyps   . History of kidney stones     also has one at present time   . Diabetes mellitus     takes Actos,Glipizide,Metformin,and Lantus  . Anxiety     takes Xanax prn  . Depression     hx of but doesn't take any medications   Past Surgical History:  Past Surgical History  Procedure Date  . Nose surgery 20+yrs ago  . Shoulder surgery     right 20+yrs ago  . Circumcision 20+yrs ago  . Colonoscopy     PT Assessment/Plan/Recommendation PT Assessment Clinical Impression Statement: Pt s/p ACDF with no motor deficits. Pt reports normal sensation and decreased ability with finger to thumb which per pt and family is baseline due to lack of rapid movement rather than coordination deficit from surgery. Pt provideded with all education and no further questions or needs. OT aware of no needs as dressing and ADLs addressed.  PT Recommendation/Assessment: Patent does not need any further PT services No Skilled PT: All education completed;Patient at baseline level of functioning;Patient will have necessary level of assist by caregiver at discharge;Patient is modified independent with all activity/mobility PT Recommendation Follow Up Recommendations: No PT follow up Equipment Recommended: None recommended by PT PT Goals     PT Evaluation Precautions/Restrictions  Precautions Precautions: Other (comment) (cervical surgery) Prior Functioning  Home Living Lives With: Spouse Type of Home: House Home Layout: One level Home Access:  Stairs to enter Entrance Stairs-Rails: Right Entrance Stairs-Number of Steps: 2 Bathroom Shower/Tub: Tub/shower unit;Curtain Firefighter: Standard Home Adaptive Equipment: Crutches Prior Function Level of Independence: Independent with basic ADLs;Independent with transfers;Independent with homemaking with ambulation;Independent with gait Driving: Yes Cognition Cognition Arousal/Alertness: Awake/alert Overall Cognitive Status: Appears within functional limits for tasks assessed Orientation Level: Oriented X4 Sensation/Coordination Sensation Light Touch: Appears Intact Extremity Assessment RUE Assessment RUE Assessment: Within Functional Limits LUE Assessment LUE Assessment: Within Functional Limits RLE Assessment RLE Assessment: Within Functional Limits LLE Assessment LLE Assessment: Within Functional Limits Mobility (including Balance) Bed Mobility Bed Mobility: Yes Rolling Right: 6: Modified independent (Device/Increase time) Right Sidelying to Sit: 6: Modified independent (Device/Increase time);HOB flat Transfers Sit to Stand: 6: Modified independent (Device/Increase time) Stand to Sit: 6: Modified independent (Device/Increase time) Ambulation/Gait Ambulation/Gait: Yes Ambulation/Gait Assistance: 7: Independent Ambulation Distance (Feet): 450 Feet Assistive device: None Gait Pattern: Within Functional Limits Stairs: Yes Stairs Assistance: 6: Modified independent (Device/Increase time) Stair Management Technique: One rail Right Number of Stairs: 11  Height of Stairs: 8   Posture/Postural Control Posture/Postural Control: No significant limitations Exercise    End of Session PT - End of Session Activity Tolerance: Patient tolerated treatment well Patient left: in chair;with call bell in reach;with family/visitor present Nurse Communication: Mobility status for transfers;Mobility status for ambulation General Behavior During Session: [facility] for tasks  performed Cognition: [facility] for tasks performed  Delorse Lek 11/16/2011, 9:07 AM  Toney Sang, PT [phone]

## 2011-11-16 NOTE — Progress Notes (Signed)
Occupational Therapy Note  OT order received and appreciated.  Observed pt while participating in PT session during which he demonstrated no functional mobility deficits.  Pt reports he is at UE baseline function and has no ADL deficits.  OT signing off.  Thanks!  11/16/2011 Cipriano Mile OTR/L Pager [phone] Office [phone]

## 2011-11-16 NOTE — Discharge Summary (Signed)
Physician Discharge Summary  Patient ID: Lawrence Parker MRN: [id number] DOB/AGE: 1945/12/05 66 y.o.  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 Diabetes mellitus. Active Problems:  * No active hospital problems. *    Discharged Condition: good  Hospital Course: Patient was admitted to undergo surgical decompression for severe spinal cord compression at C3-4 C4-5 C5-6 and C6-C7. The surgery was successful he is ambulatory his incision is clean and dry he feels well and is discharged home to continue his diabetic control on his normal home meds.  Consults: None  Significant Diagnostic Studies: None  Treatments: Anterior cervical decompression C3-4 C4-5 C5-6 and C6-C7 arthrodesis with structural allograft, anterior plate fixation O9-G2  Discharge Exam: Blood pressure 159/75, pulse 83, temperature 97.6 F (36.4 C), temperature source Oral, resp. rate 20, height 6' (1.829 m), weight 94.4 kg (208 lb 1.8 oz), SpO2 93.00%. Incision clean and dry her function good in deltoids biceps triceps grips and intrinsics  Disposition: Discharge home  Discharge Orders    Future Orders Please Complete By Expires   Diet - low sodium heart healthy      Increase activity slowly      Discharge instructions      Comments:   Okay to shower. Do not apply salves or appointments to incision. No heavy lifting with the upper extremities greater than 15 pounds. May resume driving when not requiring pain medication and patient feels comfortable with doing so.   Call MD for:  temperature >100.4      Call MD for:  severe uncontrolled pain      Call MD for:  redness, tenderness, or signs of infection (pain, swelling, redness, odor or green/yellow discharge around incision site)        Medication List  As of  11/16/2011 12:21 PM   TAKE these medications         ALPRAZolam 0.5 MG tablet   Commonly known as: XANAX   Take 0.5 mg by mouth 2 (two) times daily as needed. For anxiety      amLODipine 10 MG tablet   Commonly known as: NORVASC   Take 10 mg by mouth daily.      aspirin 325 MG tablet   Take 325 mg by mouth daily.      diazepam 5 MG tablet   Commonly known as: VALIUM   Take 1 tablet (5 mg total) by mouth every 6 (six) hours as needed (Muscle spasm).      glipiZIDE 10 MG 24 hr tablet   Commonly known as: GLUCOTROL XL   Take 20 mg by mouth daily.      hydrochlorothiazide 25 MG tablet   Commonly known as: HYDRODIURIL   Take 25 mg by mouth daily.      insulin glargine 100 UNIT/ML injection   Commonly known as: LANTUS   Inject 30 Units into the skin at bedtime.      meloxicam 7.5 MG tablet   Commonly known as: MOBIC   Take 7.5 mg by mouth 2 (two) times daily.      metFORMIN 500 MG tablet   Commonly known as: GLUCOPHAGE   Take 1,000 mg by mouth 2 (two) times daily with a meal.      oxyCODONE-acetaminophen 5-325 MG per tablet   Commonly known as: PERCOCET   Take 1-2 tablets by mouth every  4 (four) hours as needed for pain.      pioglitazone 15 MG tablet   Commonly known as: ACTOS   Take 15 mg by mouth daily.      simvastatin 20 MG tablet   Commonly known as: ZOCOR   Take 20 mg by mouth every evening.           Follow-up Information    Follow up with Stefani Dama, MD. Schedule an appointment as soon as possible for a visit in 3 weeks. (Call Aram Beecham for appointment)    Contact information:   [address], [address] [place] [address] [phone]          Signed: Stefani Dama 11/16/2011, 12:21 PM

## 2014-12-05 ENCOUNTER — Other Ambulatory Visit: Payer: Self-pay | Admitting: Otolaryngology

## 2014-12-05 DIAGNOSIS — H9201 Otalgia, right ear: Secondary | ICD-10-CM

## 2014-12-09 ENCOUNTER — Ambulatory Visit
Admission: RE | Admit: 2014-12-09 | Discharge: 2014-12-09 | Disposition: A | Discharge status: Home / Self Care | Payer: Medicare Other | Source: Ambulatory Visit | Attending: Otolaryngology | Admitting: Otolaryngology

## 2014-12-09 DIAGNOSIS — H9201 Otalgia, right ear: Secondary | ICD-10-CM

## 2014-12-09 MED ORDER — IOPAMIDOL (ISOVUE-300) INJECTION 61%
75.0000 mL | Freq: Once | INTRAVENOUS | Status: AC | PRN
  Administered 2014-12-09: 75 mL via INTRAVENOUS

## 2014-12-21 ENCOUNTER — Other Ambulatory Visit (HOSPITAL_COMMUNITY): Payer: Self-pay | Admitting: Otolaryngology

## 2014-12-21 DIAGNOSIS — E041 Nontoxic single thyroid nodule: Secondary | ICD-10-CM

## 2014-12-27 ENCOUNTER — Other Ambulatory Visit (HOSPITAL_COMMUNITY): Payer: Self-pay | Admitting: Otolaryngology

## 2014-12-27 DIAGNOSIS — E041 Nontoxic single thyroid nodule: Secondary | ICD-10-CM

## 2015-01-02 ENCOUNTER — Ambulatory Visit (HOSPITAL_COMMUNITY)
Admission: RE | Admit: 2015-01-02 | Discharge: 2015-01-02 | Disposition: A | Discharge status: Home / Self Care | Payer: 59 | Source: Ambulatory Visit | Attending: Otolaryngology | Admitting: Otolaryngology

## 2015-01-02 DIAGNOSIS — E042 Nontoxic multinodular goiter: Secondary | ICD-10-CM | POA: Diagnosis not present

## 2015-01-02 DIAGNOSIS — E041 Nontoxic single thyroid nodule: Secondary | ICD-10-CM

## 2015-01-06 ENCOUNTER — Ambulatory Visit (HOSPITAL_COMMUNITY)
Admission: RE | Admit: 2015-01-06 | Discharge: 2015-01-06 | Disposition: A | Discharge status: Home / Self Care | Payer: 59 | Source: Ambulatory Visit | Attending: Otolaryngology | Admitting: Otolaryngology

## 2015-01-06 DIAGNOSIS — E041 Nontoxic single thyroid nodule: Secondary | ICD-10-CM

## 2015-01-06 DIAGNOSIS — E042 Nontoxic multinodular goiter: Secondary | ICD-10-CM | POA: Insufficient documentation

## 2015-01-06 MED ORDER — LIDOCAINE HCL (PF) 1 % IJ SOLN
INTRAMUSCULAR | Status: AC
  Filled 2015-01-06: qty 10

## 2015-01-06 NOTE — Procedures (Signed)
Interventional Radiology Procedure Note  Procedure: US guided thyroid biopsy x3  Complications: None  Estimated Blood Loss: 0  Recommendations: - DC home - Path pending  Signed,  Criselda Peaches, MD

## 2015-02-06 ENCOUNTER — Other Ambulatory Visit: Payer: Self-pay | Admitting: Neurological Surgery

## 2015-02-06 DIAGNOSIS — M48061 Spinal stenosis, lumbar region without neurogenic claudication: Secondary | ICD-10-CM

## 2015-02-18 ENCOUNTER — Ambulatory Visit
Admission: RE | Admit: 2015-02-18 | Discharge: 2015-02-18 | Disposition: A | Discharge status: Home / Self Care | Payer: 59 | Source: Ambulatory Visit | Attending: Neurological Surgery | Admitting: Neurological Surgery

## 2015-02-18 DIAGNOSIS — M48061 Spinal stenosis, lumbar region without neurogenic claudication: Secondary | ICD-10-CM

## 2015-02-24 ENCOUNTER — Other Ambulatory Visit: Payer: Self-pay | Admitting: Neurological Surgery

## 2015-02-28 ENCOUNTER — Inpatient Hospital Stay (HOSPITAL_COMMUNITY): Admission: RE | Admit: 2015-02-28 | Payer: 59 | Source: Ambulatory Visit | Admitting: Neurological Surgery

## 2015-02-28 ENCOUNTER — Encounter (HOSPITAL_COMMUNITY): Admission: RE | Payer: Self-pay | Source: Ambulatory Visit

## 2015-02-28 SURGERY — LUMBAR LAMINECTOMY WITH COFLEX 2 LEVEL
Anesthesia: General | Site: Back

## 2015-06-02 ENCOUNTER — Other Ambulatory Visit: Payer: Self-pay | Admitting: Gastroenterology

## 2015-07-04 ENCOUNTER — Other Ambulatory Visit: Payer: Self-pay | Admitting: Otolaryngology

## 2015-07-04 DIAGNOSIS — E041 Nontoxic single thyroid nodule: Secondary | ICD-10-CM

## 2015-10-29 IMAGING — US US SOFT TISSUE HEAD/NECK
1 series · 13 of 25 positions shown · non-contrast
Comparison: Cervical spine CT- 12/09/2014

CLINICAL DATA: CT scan demonstrated calcified right thyroid nodule.

EXAM:
THYROID ULTRASOUND
TECHNIQUE: Ultrasound examination of the thyroid gland and adjacent soft
tissues was performed.

[Series 1: us soft tissue head/neck · 0.07mm/px · 13 of 29 slices shown]
[im 1/29]
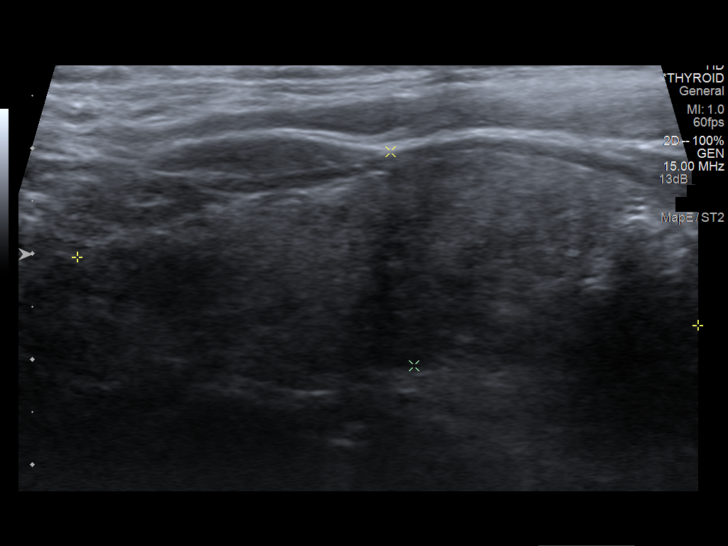
[im 3/29]
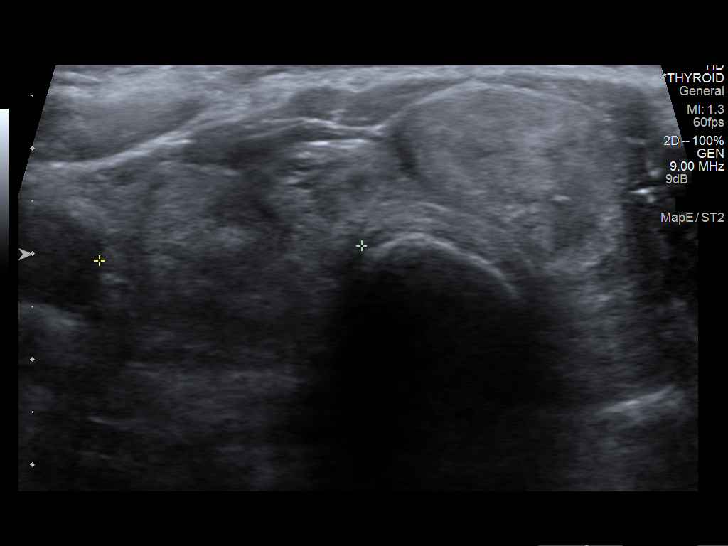
[im 5/29]
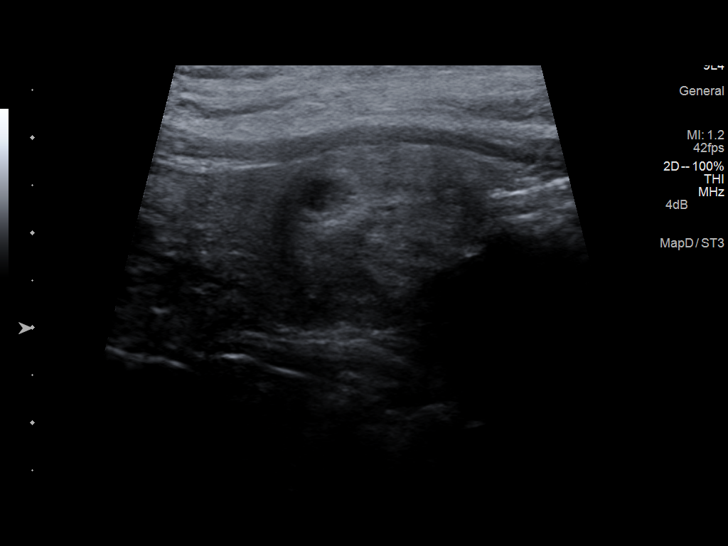
[im 8/29]
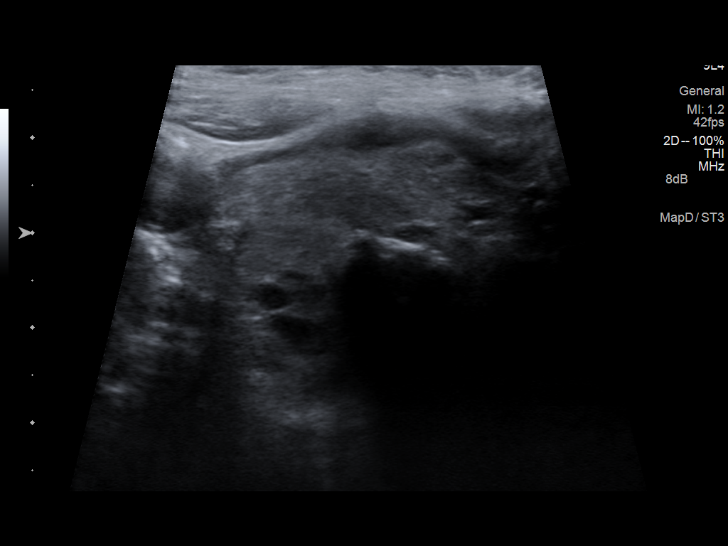
[im 10/29]
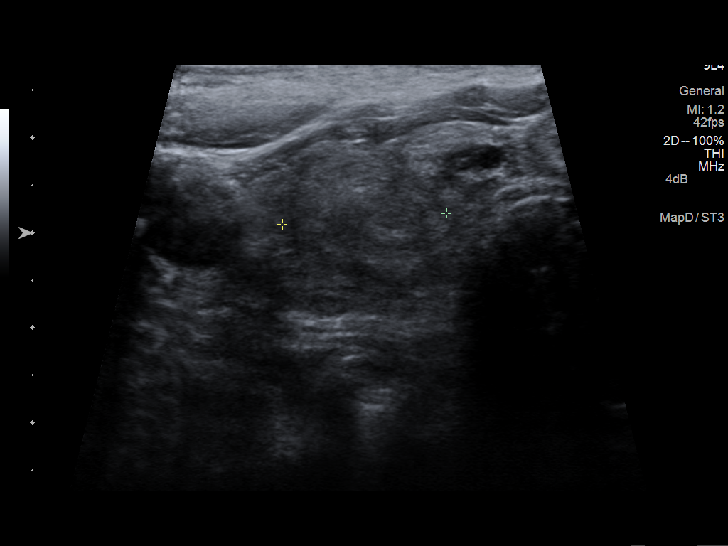
[im 12/29]
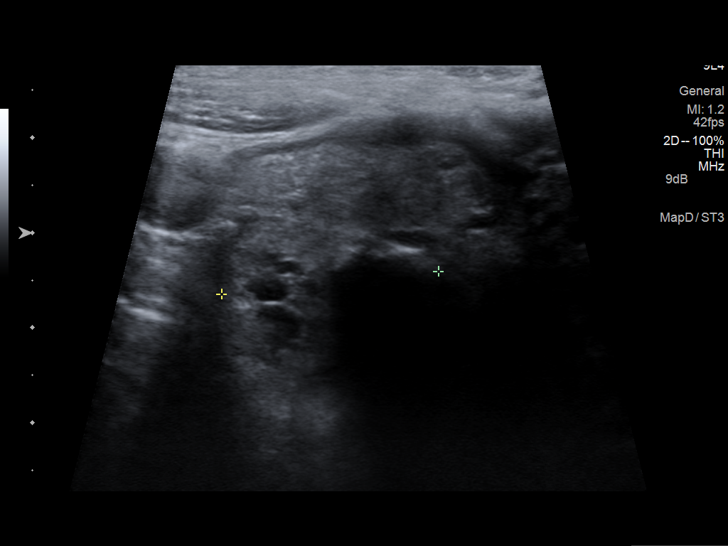
[im 15/29]
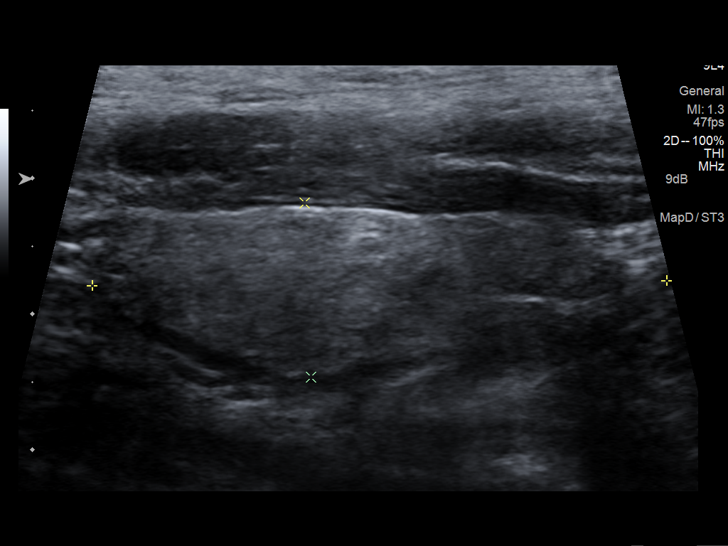
[im 17/29]
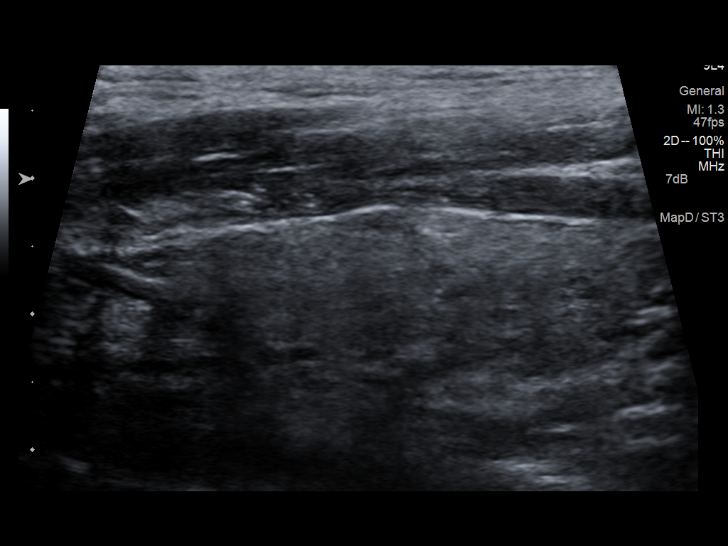
[im 19/29]
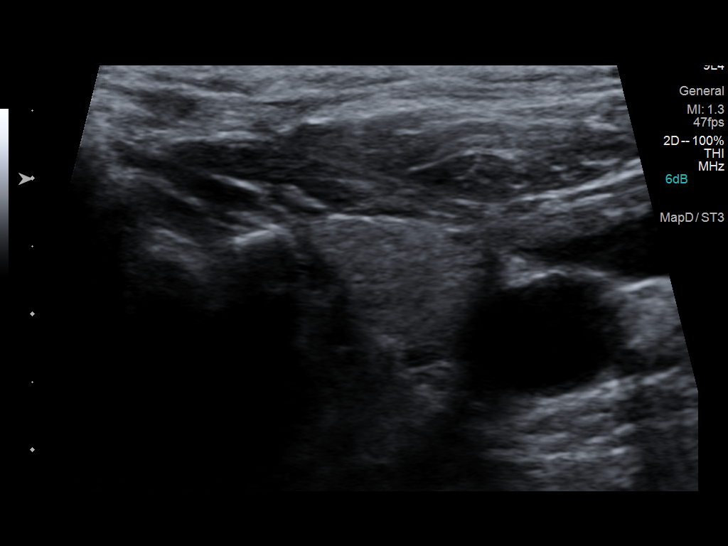
[im 22/29]
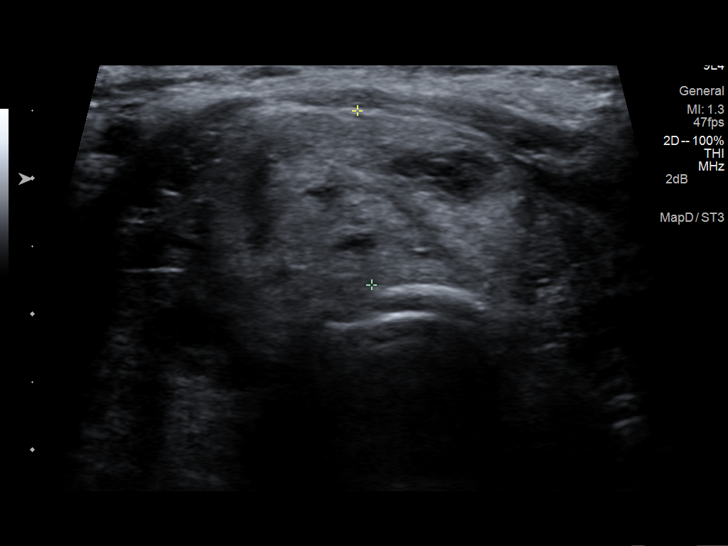
[im 24/29]
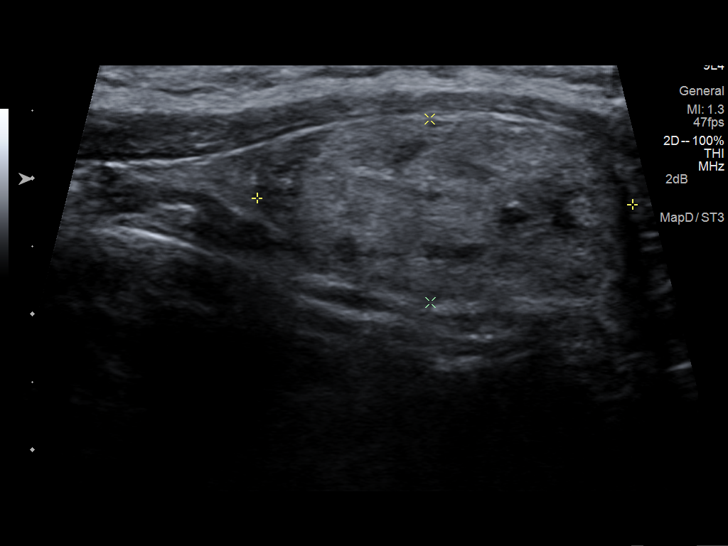
[im 26/29]
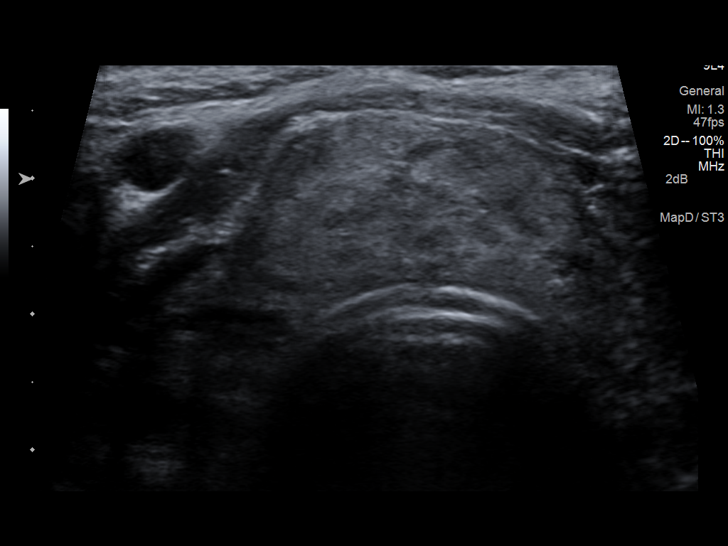
[im 29/29]
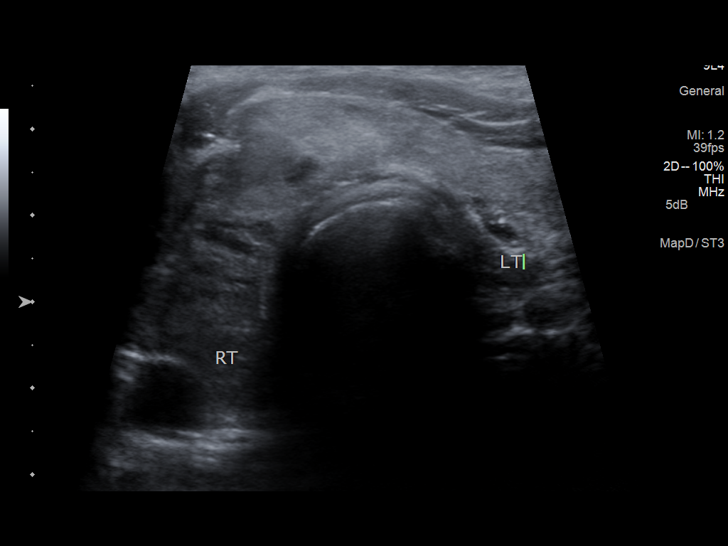

[13 of 25 positions shown; findings below may reference images not displayed]

FINDINGS: There is mild diffuse heterogeneity of thyroid parenchymal
echotexture

Right thyroid lobe

Measurements: Normal in size measuring 5.9 x 2.0 x 2.5 cm.

*Right, mid - 2.0 x 1.9 x 1.7 cm - mixed echogenic, partially
cystic, predominantly solid.

*Right, inferior - 1.9 x 1.6 x 2.3 cm - mixed echogenic with densely
shadowing mural calcification - this nodule correlates with the
dominant calcified nodule seen on preceding neck CT.

Left thyroid lobe

Measurements: Normal in size measuring 4.2 x 1.3 x 1.9 cm.

No discrete nodule or mass is identified within the left lobe of the
thyroid.

Isthmus

Thickness: Enlarged measuring 1.3 cm in diameter.

*Left, lateral - 2.8 x 1.4 x 2.7 cm - mixed echogenic, partially
cystic, predominantly solid

Lymphadenopathy

None visualized.
IMPRESSION: Findings suggestive of multi nodular goiter. The codominant nodules
within the right lobe of the thyroid as well as the dominant
approximately 2.8 cm nodule within the left side of the thyroid
isthmus all meet imaging criteria to recommend percutaneous
sampling.

This recommendation follows the consensus statement: Management of
Thyroid Nodules Detected at US: Society of Radiologists in

## 2016-04-12 ENCOUNTER — Encounter: Payer: Self-pay | Admitting: Primary Care

## 2016-04-12 ENCOUNTER — Ambulatory Visit (INDEPENDENT_AMBULATORY_CARE_PROVIDER_SITE_OTHER): Payer: Medicare HMO | Admitting: Primary Care

## 2016-04-12 VITALS — BP 130/72 | HR 63 | Temp 97.5°F | Ht 71.0 in | Wt 209.8 lb

## 2016-04-12 DIAGNOSIS — I10 Essential (primary) hypertension: Secondary | ICD-10-CM

## 2016-04-12 DIAGNOSIS — E785 Hyperlipidemia, unspecified: Secondary | ICD-10-CM | POA: Insufficient documentation

## 2016-04-12 DIAGNOSIS — Z794 Long term (current) use of insulin: Secondary | ICD-10-CM

## 2016-04-12 DIAGNOSIS — E119 Type 2 diabetes mellitus without complications: Secondary | ICD-10-CM | POA: Diagnosis not present

## 2016-04-12 DIAGNOSIS — M1990 Unspecified osteoarthritis, unspecified site: Secondary | ICD-10-CM

## 2016-04-12 DIAGNOSIS — E1165 Type 2 diabetes mellitus with hyperglycemia: Secondary | ICD-10-CM | POA: Insufficient documentation

## 2016-04-12 LAB — COMPREHENSIVE METABOLIC PANEL
ALBUMIN: 4.1 g/dL (ref 3.5–5.2)
ALT: 25 U/L (ref 0–53)
AST: 23 U/L (ref 0–37)
Alkaline Phosphatase: 36 U/L — ABNORMAL LOW (ref 39–117)
BUN: 13 mg/dL (ref 6–23)
CALCIUM: 9.5 mg/dL (ref 8.4–10.5)
CHLORIDE: 102 meq/L (ref 96–112)
CO2: 30 meq/L (ref 19–32)
Creatinine, Ser: 0.9 mg/dL (ref 0.40–1.50)
GFR: 88.86 mL/min (ref >60.00)
Glucose, Bld: 117 mg/dL — ABNORMAL HIGH (ref 70–99)
POTASSIUM: 3.7 meq/L (ref 3.5–5.1)
Sodium: 139 mEq/L (ref 135–145)
Total Bilirubin: 0.8 mg/dL (ref 0.2–1.2)
Total Protein: 6.9 g/dL (ref 6.0–8.3)

## 2016-04-12 LAB — LIPID PANEL
CHOLESTEROL: 109 mg/dL (ref 0–200)
HDL: 44.1 mg/dL (ref >39.00)
LDL CALC: 43 mg/dL (ref 0–99)
NonHDL: 64.43
TRIGLYCERIDES: 105 mg/dL (ref 0.0–149.0)
Total CHOL/HDL Ratio: 2
VLDL: 21 mg/dL (ref 0.0–40.0)

## 2016-04-12 LAB — MICROALBUMIN / CREATININE URINE RATIO
Creatinine,U: 171.1 mg/dL
MICROALB UR: 6.9 mg/dL — AB (ref 0.0–1.9)
Microalb Creat Ratio: 4 mg/g (ref 0.0–30.0)

## 2016-04-12 LAB — HEMOGLOBIN A1C: HEMOGLOBIN A1C: 9.2 % — AB (ref 4.6–6.5)

## 2016-04-12 NOTE — Assessment & Plan Note (Signed)
Managed on Simvastatin 20 mg. Will repeat lipids today as he is fasting. LFT's pending.

## 2016-04-12 NOTE — Assessment & Plan Note (Signed)
Endorses A1C of 9 in March 2017. He is managed on numerous oral medications as well as Lantus HS. Will repeat A1C today. Home readings sounds stable. If A1C below 7.5 then will remove Actos. Will also check kidney function.

## 2016-04-12 NOTE — Progress Notes (Signed)
Pre visit review using our clinic review tool, if applicable. No additional management support is needed unless otherwise documented below in the visit note. 

## 2016-04-12 NOTE — Progress Notes (Signed)
Subjective:    Patient ID: Lawrence Parker, male    DOB: 10-Jan-1946, 70 y.o.   MRN: [id number]  HPI  Lawrence Parker is a 70 year old male who presents today to establish care and discuss the problems mentioned below. Will obtain old records. His last physical was years ago.  1) Essential Hypertension: Diagnosed years ago. Currently managed on Amlodipine 10 mg, Clonidine 0.1 mg BID, HCTZ 25 mg. His blood pressure in the office today is stable. Denies chest pain, shortness of breath, dizziness. He does not check his blood pressure at home.  2) Type 2 Diabetes: Diagnosed in 1990's. Currently managed on Glipizide 10 mg BID, Lantus 50 units HS, Metformin 1000 mg BID, pioglitazone 30 mg. He checks his blood sugars infrequently, mostly fasting in the mornings which runs in the 80's-120's. His highest reading within the last 3 months was 170 and his lowest reading was 80. His last A1C was 9 in March 2017 per patient.  He endorses a fair diet. He has been on vacation recently and has not done so well on his diet. He's recently eaten more fast food and junk food. Breakfast: Oatmeal, sometimes bacon, eggs, bread Lunch: Vegetables, occasional fast food Dinner: Salad, vegetables, occasional fast food Snacks: Chips Desserts: Not frequently Beverages: Water, diet mountain dew  3) Hyperlipidemia: Diagnosed in 90's. Currently managed on Simvastatin 20 mg. He denies myalgias.   4) Knee pain: Located to the left knee since July 4th. He also reports swelling. He's taken Aleve last night without much improvement. He is seeing a chiropractor for his hip and back pain. Denies injury/trauma, numbness/tingling.   5) Xanax use: Provided to him from Dr. Hardin Negus. He denies history of anxiety and depression. He will use Xanax very infrequently "when I feel bad". He doesn't know where his current bottle is located. No recent use.  Review of Systems  Respiratory: Negative for shortness of breath.   Cardiovascular:  Negative for chest pain.  Neurological: Negative for dizziness, numbness and headaches.       Past Medical History:  Diagnosis Date  . Anxiety    takes Xanax prn  . Arthritis    neck  . Back pain    deteriorating disc  . Colon polyps   . Depression    hx of but doesn't take any medications  . Diabetes mellitus    takes Actos,Glipizide,Metformin,and Lantus  . History of kidney stones    also has one at present time   . Hyperlipidemia    takes Zocor daily  . Hypertension    takes Amlodipine daily and HCTZ daily  . Neck pain    cervical spondylosis     Social History   Social History  . Marital status: Married    Spouse name: N/A  . Number of children: N/A  . Years of education: N/A   Occupational History  . Not on file.   Social History Main Topics  . Smoking status: Never Smoker  . Smokeless tobacco: Not on file     Comment: quit smoking 62yrs ago  . Alcohol use No  . Drug use: No  . Sexual activity: Yes   Other Topics Concern  . Not on file   Social History Narrative   Married.   2 children. 2 grandchildren.   Retired. Drove trucks for 43 years.   Enjoys traveling, spending time with family.    Past Surgical History:  Procedure Laterality Date  . CERVICAL SPINE SURGERY    .  CIRCUMCISION  20+yrs ago  . COLONOSCOPY    . NOSE SURGERY  20+yrs ago  . SHOULDER SURGERY     right 20+yrs ago    Family History  Problem Relation Age of Onset  . Anesthesia problems Neg Hx   . Hypotension Neg Hx   . Malignant hyperthermia Neg Hx   . Pseudochol deficiency Neg Hx     No Known Allergies  Current Outpatient Prescriptions on File Prior to Visit  Medication Sig Dispense Refill  . amLODipine (NORVASC) 10 MG tablet Take 5 mg by mouth daily.     . hydrochlorothiazide (HYDRODIURIL) 25 MG tablet Take 25 mg by mouth daily.    . insulin glargine (LANTUS) 100 UNIT/ML injection Inject 50 Units into the skin at bedtime.     . meloxicam (MOBIC) 7.5 MG tablet Take  7.5 mg by mouth 2 (two) times daily.    . metFORMIN (GLUCOPHAGE) 500 MG tablet Take 1,000 mg by mouth 2 (two) times daily with a meal.     . simvastatin (ZOCOR) 20 MG tablet Take 20 mg by mouth every evening.    Marland Kitchen ALPRAZolam (XANAX) 0.5 MG tablet Take 0.5 mg by mouth 2 (two) times daily as needed. For anxiety    . aspirin 325 MG tablet Take 325 mg by mouth daily.     No current facility-administered medications on file prior to visit.     BP 130/72 (BP Location: Right Arm, Patient Position: Sitting, Cuff Size: Normal)   Pulse 63   Temp 97.5 F (36.4 C) (Oral)   Ht 5\' 11"  (1.803 m)   Wt 209 lb 12.8 oz (95.2 kg)   SpO2 (!) 63%   BMI 29.26 kg/m    Objective:   Physical Exam  Constitutional: He is oriented to person, place, and time. He appears well-nourished.  Neck: Neck supple.  Cardiovascular: Normal rate and regular rhythm.   Pulmonary/Chest: Effort normal and breath sounds normal. He has no wheezes. He has no rales.  Neurological: He is alert and oriented to person, place, and time.  Skin: Skin is warm and dry.  Psychiatric: He has a normal mood and affect.          Assessment & Plan:  Xanax Use:  Infrequent. Provided by prior PCP and has not taken recently. Discussed that I will not be refilling due to increased risk of interactions with other medications and for safety reasons. He verbalized understanding. Removed from current medication list.  Sheral Flow, NP

## 2016-04-12 NOTE — Assessment & Plan Note (Signed)
Back, hips, shoulders, knees. Takes Meloxicam PRN, not currently taking at this time. Will have him try ibuprofen for current swelling of his left knee. Discussed to refrain from use of Meloxicam and ibuprofen together.

## 2016-04-12 NOTE — Patient Instructions (Signed)
Complete lab work prior to leaving today. I will notify you of your results once received.   Try taking ibuprofen 600 mg three times daily as needed for left knee pain and inflammation. Stop taking Aleve. Try applying ice to your knee and elevating it when resting.  I will be in touch with you in regards to a follow up appointment once I get these results.   It was a pleasure to meet you today! Please don't hesitate to call me with any questions. Welcome to [facility]!

## 2016-04-12 NOTE — Assessment & Plan Note (Signed)
BP stable in clinic today. Currently managed on HCTZ 25, Amlodipine 10 mg, and Clonidine 0.1 mg BID.  Will continue current regimen for now as he denies dizziness, falls, fatigue.

## 2016-04-15 ENCOUNTER — Other Ambulatory Visit: Payer: Self-pay | Admitting: Primary Care

## 2016-04-15 ENCOUNTER — Other Ambulatory Visit: Payer: Self-pay | Admitting: Internal Medicine

## 2016-04-15 DIAGNOSIS — I10 Essential (primary) hypertension: Secondary | ICD-10-CM

## 2016-04-15 MED ORDER — LISINOPRIL 10 MG PO TABS: 10.0000 mg | ORAL_TABLET | Freq: Every day | ORAL | 0 refills | Status: DC

## 2016-04-15 NOTE — Telephone Encounter (Signed)
[facility] calling stating pt needing a refill on test strips.  Please send refill for test strips

## 2016-04-16 ENCOUNTER — Encounter: Payer: Self-pay | Admitting: Primary Care

## 2016-04-16 MED ORDER — GLUCOSE BLOOD VI STRP: ORAL_STRIP | 11 refills | Status: DC

## 2016-04-16 MED ORDER — GLUCOSE BLOOD VI STRP: 1.0000 | ORAL_STRIP | Freq: Two times a day (BID) | 5 refills | Status: DC | PRN

## 2016-04-16 NOTE — Addendum Note (Signed)
Addended by: Lurlean Nanny on: 04/16/2016 09:59 AM   Modules accepted: Orders

## 2016-04-16 NOTE — Telephone Encounter (Signed)
Ok to refill test strips

## 2016-04-17 ENCOUNTER — Telehealth: Payer: Self-pay

## 2016-04-17 MED ORDER — GLUCOSE BLOOD VI STRP: ORAL_STRIP | 5 refills | Status: DC

## 2016-04-17 MED ORDER — GLUCOSE BLOOD VI STRP: ORAL_STRIP | 11 refills | Status: DC

## 2016-04-17 MED ORDER — ACCU-CHEK AVIVA PLUS W/DEVICE KIT: PACK | 0 refills | Status: DC

## 2016-04-17 MED ORDER — ACCU-CHEK FASTCLIX LANCETS MISC: 5 refills | Status: DC

## 2016-04-17 NOTE — Telephone Encounter (Signed)
Lawrence Parker with [facility] request accuchek aviva plus diabetic supplies with accuchek fastclix due to ins coverage. Done per protocol.

## 2016-05-13 ENCOUNTER — Ambulatory Visit (INDEPENDENT_AMBULATORY_CARE_PROVIDER_SITE_OTHER): Payer: Medicare HMO | Admitting: Primary Care

## 2016-05-13 ENCOUNTER — Encounter: Payer: Self-pay | Admitting: Primary Care

## 2016-05-13 VITALS — BP 138/74 | HR 63 | Temp 97.7°F | Ht 71.0 in | Wt 210.0 lb

## 2016-05-13 DIAGNOSIS — E119 Type 2 diabetes mellitus without complications: Secondary | ICD-10-CM | POA: Diagnosis not present

## 2016-05-13 DIAGNOSIS — M1990 Unspecified osteoarthritis, unspecified site: Secondary | ICD-10-CM | POA: Diagnosis not present

## 2016-05-13 DIAGNOSIS — Z794 Long term (current) use of insulin: Secondary | ICD-10-CM

## 2016-05-13 DIAGNOSIS — I10 Essential (primary) hypertension: Secondary | ICD-10-CM | POA: Diagnosis not present

## 2016-05-13 DIAGNOSIS — J302 Other seasonal allergic rhinitis: Secondary | ICD-10-CM

## 2016-05-13 MED ORDER — FLUTICASONE PROPIONATE 50 MCG/ACT NA SUSP: 2.0000 | Freq: Every day | NASAL | 5 refills | Status: DC

## 2016-05-13 NOTE — Assessment & Plan Note (Signed)
Left knee pain with swelling over last 1-2 months. Increased activity in yard. Discussed to stop ibuprofen use as he was also taking Meloxicam even though advised not to do so. Will have him try Meloxicam BID.  Offered xray and physical therapy, he declines at this time.

## 2016-05-13 NOTE — Patient Instructions (Signed)
Continue to monitor your blood sugars. Check your blood sugar every morning before you eat breakfast and every evening 2 hours after you've eaten dinner.  Please notify me if you get readings below 70 or above 200.  Stop taking Ibuprofen. You cannot take this with Meloxicam. You may take Meloxicam twice daily as needed for pain and inflammation of your knee.  It is important that you improve your diet. Please limit carbohydrates in the form of white bread, rice, pasta, cookies, sugary drinks, etc. Increase your consumption of fresh fruits and vegetables, whole grains, lean protein.  Ensure you are consuming 64 ounces of water daily.  Start exercising. You should be getting 1 hour of moderate intensity exercise 3 days weekly.  Schedule a follow up appointment in 2 months for re-evaluation.  It was a pleasure to see you today!  Diabetes Mellitus and Food It is important for you to manage your blood sugar (glucose) level. Your blood glucose level can be greatly affected by what you eat. Eating healthier foods in the appropriate amounts throughout the day at about the same time each day will help you control your blood glucose level. It can also help slow or prevent worsening of your diabetes mellitus. Healthy eating may even help you improve the level of your blood pressure and reach or maintain a healthy weight.  General recommendations for healthful eating and cooking habits include:  Eating meals and snacks regularly. Avoid going long periods of time without eating to lose weight.  Eating a diet that consists mainly of plant-based foods, such as fruits, vegetables, nuts, legumes, and whole grains.  Using low-heat cooking methods, such as baking, instead of high-heat cooking methods, such as deep frying. Work with your dietitian to make sure you understand how to use the Nutrition Facts information on food labels. HOW CAN FOOD AFFECT ME? Carbohydrates Carbohydrates affect your blood  glucose level more than any other type of food. Your dietitian will help you determine how many carbohydrates to eat at each meal and teach you how to count carbohydrates. Counting carbohydrates is important to keep your blood glucose at a healthy level, especially if you are using insulin or taking certain medicines for diabetes mellitus. Alcohol Alcohol can cause sudden decreases in blood glucose (hypoglycemia), especially if you use insulin or take certain medicines for diabetes mellitus. Hypoglycemia can be a life-threatening condition. Symptoms of hypoglycemia (sleepiness, dizziness, and disorientation) are similar to symptoms of having too much alcohol.  If your health care provider has given you approval to drink alcohol, do so in moderation and use the following guidelines:  Women should not have more than one drink per day, and men should not have more than two drinks per day. One drink is equal to:  12 oz of beer.  5 oz of wine.  1 oz of hard liquor.  Do not drink on an empty stomach.  Keep yourself hydrated. Have water, diet soda, or unsweetened iced tea.  Regular soda, juice, and other mixers might contain a lot of carbohydrates and should be counted. WHAT FOODS ARE NOT RECOMMENDED? As you make food choices, it is important to remember that all foods are not the same. Some foods have fewer nutrients per serving than other foods, even though they might have the same number of calories or carbohydrates. It is difficult to get your body what it needs when you eat foods with fewer nutrients. Examples of foods that you should avoid that are high in calories and  carbohydrates but low in nutrients include:  Trans fats (most processed foods list trans fats on the Nutrition Facts label).  Regular soda.  Juice.  Candy.  Sweets, such as cake, pie, doughnuts, and cookies.  Fried foods. WHAT FOODS CAN I EAT? Eat nutrient-rich foods, which will nourish your body and keep you healthy.  The food you should eat also will depend on several factors, including:  The calories you need.  The medicines you take.  Your weight.  Your blood glucose level.  Your blood pressure level.  Your cholesterol level. You should eat a variety of foods, including:  Protein.  Lean cuts of meat.  Proteins low in saturated fats, such as fish, egg whites, and beans. Avoid processed meats.  Fruits and vegetables.  Fruits and vegetables that may help control blood glucose levels, such as apples, mangoes, and yams.  Dairy products.  Choose fat-free or low-fat dairy products, such as milk, yogurt, and cheese.  Grains, bread, pasta, and rice.  Choose whole grain products, such as multigrain bread, whole oats, and brown rice. These foods may help control blood pressure.  Fats.  Foods containing healthful fats, such as nuts, avocado, olive oil, canola oil, and fish. DOES EVERYONE WITH DIABETES MELLITUS HAVE THE SAME MEAL PLAN? Because every person with diabetes mellitus is different, there is not one meal plan that works for everyone. It is very important that you meet with a dietitian who will help you create a meal plan that is just right for you.   This information is not intended to replace advice given to you by your health care provider. Make sure you discuss any questions you have with your health care provider.   Document Released: 05/30/2005 Document Revised: 09/23/2014 Document Reviewed: 07/30/2013 Elsevier Interactive Patient Education [employer].

## 2016-05-13 NOTE — Progress Notes (Signed)
diPre visit review using our clinic review tool, if applicable. No additional management support is needed unless otherwise documented below in the visit note.

## 2016-05-13 NOTE — Assessment & Plan Note (Signed)
Stopped Clonidine and added Lisinopril last visit. BP stable in office today. Continue same. BMP last visit stable. Repeat BMP next visit.

## 2016-05-13 NOTE — Progress Notes (Signed)
Subjective:    Patient ID: Lawrence Parker, male    DOB: 09-Dec-1945, 70 y.o.   MRN: [id number]  HPI  Lawrence Parker is a 70 year old male who presents today for follow up.  1) Type 2 Diabetes: A1C of 9.2 in July 2017. Currently managed on Actos 30 mg daily, Glipizide 10 mg BID, Metformin 500 mg BID, Lantus 50 units HS. Prior medical records indicate chronically elevated A1C ranging from 9-11.   He's been checking his blood sugars at home twice daily before meals which are running 70's-high 100's. His lowest reading was 55 and highest reading was 180.   His diet currently consists of: Breakfast: Berniece Salines, eggs, toast Lunch: Salad, vegetables Dinner: Microbiologist, vegetables, Bread Snacks: Fruit Desserts: Occasionally Beverages: Diet soda, water, occasional sweet tea  Exercise: He is currently not exercising.   2) Essential Hypertension: Currently managed on Amlodipine 10 mg, HCTZ 25 mg, and Lisinopril 10 mg. Lisinopril was added last visit given positive urine microalbumin. Clonidine was discontinued. His BP in the clinic is stable today at 138/74. Denies chest pain, lower extremity swelling.  3) Left Knee Pain: Also with swelling to surrounding patellar region. Difficulty walking, weakness. His pain is mostly located to the medial aspect of knee. His symptoms have been present for the past 2 months. He was digging up a water line yesterday in his yard. He's taken Meloxicam and Ibuprofen but continues to notice swelling. Occasional improvement in pain. He's been visiting a chiropractor and has not found improvement.   Review of Systems  Constitutional: Negative for fatigue.  Respiratory: Negative for shortness of breath.   Cardiovascular: Negative for chest pain.  Musculoskeletal: Positive for arthralgias and joint swelling.  Neurological: Negative for dizziness and headaches.       Past Medical History:  Diagnosis Date  . Anxiety    takes Xanax prn  . Arthritis    neck  . Back pain    deteriorating disc  . Bladder neck obstruction   . Colon polyps   . Depression    hx of but doesn't take any medications  . Diabetes mellitus    takes Actos,Glipizide,Metformin,and Lantus  . Diverticula of colon   . History of kidney stones    also has one at present time   . Hyperlipidemia    takes Zocor daily  . Hypertension    takes Amlodipine daily and HCTZ daily  . Neck pain    cervical spondylosis  . Peyronie disease      Social History   Social History  . Marital status: Married    Spouse name: N/A  . Number of children: N/A  . Years of education: N/A   Occupational History  . Not on file.   Social History Main Topics  . Smoking status: Never Smoker  . Smokeless tobacco: Not on file     Comment: quit smoking 45yr ago  . Alcohol use No  . Drug use: No  . Sexual activity: Yes   Other Topics Concern  . Not on file   Social History Narrative   Married.   2 children. 2 grandchildren.   Retired. Drove trucks for 43 years.   Enjoys traveling, spending time with family.    Past Surgical History:  Procedure Laterality Date  . CERVICAL SPINE SURGERY    . CIRCUMCISION  20+yrs ago  . COLONOSCOPY    . NOSE SURGERY  20+yrs ago  . SHOULDER SURGERY     right 20+yrs  ago    Family History  Problem Relation Age of Onset  . Anesthesia problems Neg Hx   . Hypotension Neg Hx   . Malignant hyperthermia Neg Hx   . Pseudochol deficiency Neg Hx     No Known Allergies  Current Outpatient Prescriptions on File Prior to Visit  Medication Sig Dispense Refill  . ACCU-CHEK FASTCLIX LANCETS MISC Check blood sugar twice a day and as directed. Dx E11.9 100 each 5  . amLODipine (NORVASC) 10 MG tablet Take 5 mg by mouth daily.     Marland Kitchen aspirin 325 MG tablet Take 325 mg by mouth daily.    . Blood Glucose Monitoring Suppl (ACCU-CHEK AVIVA PLUS) w/Device KIT Check blood sugar twice a day and as directed. Dx E11.9 1 kit 0  . glipiZIDE (GLUCOTROL) 10 MG  tablet Take 10 mg by mouth 2 (two) times daily before a meal.    . glucose blood (ACCU-CHEK AVIVA PLUS) test strip Check blood sugar twice a day and as directed. Dx E11.9 100 each 5  . hydrochlorothiazide (HYDRODIURIL) 25 MG tablet Take 25 mg by mouth daily.    . insulin glargine (LANTUS) 100 UNIT/ML injection Inject 50 Units into the skin at bedtime.     Marland Kitchen lisinopril (PRINIVIL,ZESTRIL) 10 MG tablet Take 1 tablet (10 mg total) by mouth daily. 90 tablet 0  . meloxicam (MOBIC) 7.5 MG tablet Take 7.5 mg by mouth 2 (two) times daily.    . metFORMIN (GLUCOPHAGE) 500 MG tablet Take 1,000 mg by mouth 2 (two) times daily with a meal.     . pioglitazone (ACTOS) 30 MG tablet Take 30 mg by mouth daily.    . simvastatin (ZOCOR) 20 MG tablet Take 20 mg by mouth every evening.     No current facility-administered medications on file prior to visit.     BP 138/74   Pulse 63   Temp 97.7 F (36.5 C) (Oral)   Ht 5' 11" (1.803 m)   Wt 210 lb (95.3 kg)   SpO2 98%   BMI 29.29 kg/m    Objective:   Physical Exam  Constitutional: He appears well-nourished.  Neck: Neck supple.  Cardiovascular: Normal rate and regular rhythm.   Pulmonary/Chest: Effort normal and breath sounds normal.  Musculoskeletal:  Mild swelling noted to surround left patellar region. Non tender. Overall good ROM.  Skin: Skin is warm and dry.  Psychiatric: He has a normal mood and affect.          Assessment & Plan:

## 2016-05-13 NOTE — Assessment & Plan Note (Signed)
Historically chronic elevated A1C levels ranging 9-11 from prior records. Home sugar logs overall stable. Discussed to monitor sugars fasting and 2 hours post prandial. Discussed proper diabetic diet. He declines diabetic nutrition referral. Handout provided today. Repeat A1C in 2 months.

## 2016-05-31 ENCOUNTER — Other Ambulatory Visit: Payer: Self-pay | Admitting: Primary Care

## 2016-05-31 DIAGNOSIS — M1990 Unspecified osteoarthritis, unspecified site: Secondary | ICD-10-CM

## 2016-06-03 NOTE — Telephone Encounter (Signed)
Ok to refill? Electronically refill request for   meloxicam (MOBIC) 7.5 MG tablet Take 7.5 mg by mouth 2 (two) times daily.   Medication have not been prescribed by Anda Kraft. Last seen on 05/13/2016. Next follow up on 07/15/2016.

## 2016-06-04 NOTE — Telephone Encounter (Addendum)
Spoken and notified patient of Kate's comments. Patient stated that meloxicam is helping with his arthritis.

## 2016-06-15 ENCOUNTER — Other Ambulatory Visit: Payer: Self-pay | Admitting: Primary Care

## 2016-06-15 DIAGNOSIS — E119 Type 2 diabetes mellitus without complications: Secondary | ICD-10-CM

## 2016-06-17 NOTE — Telephone Encounter (Signed)
Ok to refill? Electronically refill request for pioglitazone (ACTOS) 30 MG tablet.  Medication have not been prescribed by Lawrence Parker. Last seen on 05/13/2016. Next follow up 07/15/2016.

## 2016-07-04 ENCOUNTER — Other Ambulatory Visit: Payer: Self-pay | Admitting: Primary Care

## 2016-07-04 DIAGNOSIS — I10 Essential (primary) hypertension: Secondary | ICD-10-CM

## 2016-07-10 ENCOUNTER — Other Ambulatory Visit: Payer: Self-pay | Admitting: Primary Care

## 2016-07-10 DIAGNOSIS — I10 Essential (primary) hypertension: Secondary | ICD-10-CM

## 2016-07-10 NOTE — Telephone Encounter (Signed)
Ok to refill? Electronically refill request for   hydrochlorothiazide (HYDRODIURIL) 25 MG tablet  Medication have not been prescribed by Anda Kraft. Last seen on 05/13/2016. Follow up on 07/15/2016.

## 2016-07-15 ENCOUNTER — Encounter: Payer: Self-pay | Admitting: Primary Care

## 2016-07-15 ENCOUNTER — Ambulatory Visit (INDEPENDENT_AMBULATORY_CARE_PROVIDER_SITE_OTHER): Payer: Medicare HMO | Admitting: Primary Care

## 2016-07-15 VITALS — BP 120/56 | HR 76 | Temp 97.4°F | Ht 71.0 in | Wt 212.8 lb

## 2016-07-15 DIAGNOSIS — R809 Proteinuria, unspecified: Secondary | ICD-10-CM

## 2016-07-15 DIAGNOSIS — I10 Essential (primary) hypertension: Secondary | ICD-10-CM

## 2016-07-15 DIAGNOSIS — E1129 Type 2 diabetes mellitus with other diabetic kidney complication: Secondary | ICD-10-CM

## 2016-07-15 DIAGNOSIS — Z23 Encounter for immunization: Secondary | ICD-10-CM | POA: Diagnosis not present

## 2016-07-15 DIAGNOSIS — E119 Type 2 diabetes mellitus without complications: Secondary | ICD-10-CM

## 2016-07-15 DIAGNOSIS — Z794 Long term (current) use of insulin: Secondary | ICD-10-CM | POA: Diagnosis not present

## 2016-07-15 LAB — BASIC METABOLIC PANEL
BUN: 12 mg/dL (ref 6–23)
CALCIUM: 9.5 mg/dL (ref 8.4–10.5)
CO2: 29 meq/L (ref 19–32)
CREATININE: 0.94 mg/dL (ref 0.40–1.50)
Chloride: 104 mEq/L (ref 96–112)
GFR: 84.44 mL/min (ref >60.00)
GLUCOSE: 96 mg/dL (ref 70–99)
Potassium: 3.8 mEq/L (ref 3.5–5.1)
Sodium: 141 mEq/L (ref 135–145)

## 2016-07-15 LAB — HEMOGLOBIN A1C: Hgb A1c MFr Bld: 7.4 % — ABNORMAL HIGH (ref 4.6–6.5)

## 2016-07-15 NOTE — Assessment & Plan Note (Signed)
Stable since replacing Clonidine with low dose Lisinopril.  Home BP stable. Continue current regiment.  BMP pending.

## 2016-07-15 NOTE — Assessment & Plan Note (Signed)
Due for repeat A1C. Urine microalbumin UTD. Prevnar 13 provided today. Foot exam completed today.  Discussed the importance of a healthy diet and regular exercise. Managed on ACE and statin.

## 2016-07-15 NOTE — Progress Notes (Signed)
Subjective:    Patient ID: Lawrence Parker, male    DOB: [birth year], 70 y.o.   MRN: [id number]  HPI  Lawrence Parker is a 70 year old male who presents today for follow up.  1) Type 2 Diabetes: Currently managed on Actos 30 mg, Metformin 1000 mg BID, Lantus 50 units HS, Glipizide 10 mg BID. A1C of 9.2 and positive urine microalbumin in July 2017. He was treated with low dose Lisinopril in replacement of Clonidine. He was called in early August and reported blood sugar readings raning from 59-198. He is due for repeat A1C today.  Since his last visit his blood sugars are ranging 60-190's with an average number in the mid 100's. He denies numbness/tingling. He's trying to improve his diet by eating more soups,salads, and lean protein. He's limiting his consumption of breads.  2) Essential Hypertension: Currently managed on Amlodipine 10 mg, HCTZ 25 mg, Lisinopril 10 mg. His Clonidine was replaced by Lisinopril last visit given positive urine mircroalbumin. His BP in the office today is 120/56. He's checking his BP at home with readings of 130-140/60's-70's. He denies chest pain, dizziness, visual changes.  Review of Systems  Eyes: Negative for visual disturbance.  Respiratory: Negative for shortness of breath.   Cardiovascular: Negative for chest pain.  Neurological: Negative for dizziness and numbness.       Past Medical History:  Diagnosis Date  . Anxiety    takes Xanax prn  . Arthritis    neck  . Back pain    deteriorating disc  . Bladder neck obstruction   . Colon polyps   . Depression    hx of but doesn't take any medications  . Diabetes mellitus    takes Actos,Glipizide,Metformin,and Lantus  . Diverticula of colon   . History of kidney stones    also has one at present time   . Hyperlipidemia    takes Zocor daily  . Hypertension    takes Amlodipine daily and HCTZ daily  . Neck pain    cervical spondylosis  . Peyronie disease      Social History   Social History  .  Marital status: Married    Spouse name: N/A  . Number of children: N/A  . Years of education: N/A   Occupational History  . Not on file.   Social History Main Topics  . Smoking status: Never Smoker  . Smokeless tobacco: Not on file     Comment: quit smoking 64yr ago  . Alcohol use No  . Drug use: No  . Sexual activity: Yes   Other Topics Concern  . Not on file   Social History Narrative   Married.   2 children. 2 grandchildren.   Retired. Drove trucks for 43 years.   Enjoys traveling, spending time with family.    Past Surgical History:  Procedure Laterality Date  . CERVICAL SPINE SURGERY    . CIRCUMCISION  20+yrs ago  . COLONOSCOPY    . NOSE SURGERY  20+yrs ago  . SHOULDER SURGERY     right 20+yrs ago    Family History  Problem Relation Age of Onset  . Anesthesia problems Neg Hx   . Hypotension Neg Hx   . Malignant hyperthermia Neg Hx   . Pseudochol deficiency Neg Hx     No Known Allergies  Current Outpatient Prescriptions on File Prior to Visit  Medication Sig Dispense Refill  . ACCU-CHEK FASTCLIX LANCETS MISC Check blood sugar twice a day and  as directed. Dx E11.9 100 each 5  . amLODipine (NORVASC) 10 MG tablet Take 5 mg by mouth daily.     . aspirin 325 MG tablet Take 325 mg by mouth daily.    . Blood Glucose Monitoring Suppl (ACCU-CHEK AVIVA PLUS) w/Device KIT Check blood sugar twice a day and as directed. Dx E11.9 1 kit 0  . fluticasone (FLONASE) 50 MCG/ACT nasal spray Place 2 sprays into both nostrils daily. 16 g 5  . glipiZIDE (GLUCOTROL) 10 MG tablet Take 10 mg by mouth 2 (two) times daily before a meal.    . glucose blood (ACCU-CHEK AVIVA PLUS) test strip Check blood sugar twice a day and as directed. Dx E11.9 100 each 5  . hydrochlorothiazide (HYDRODIURIL) 25 MG tablet TAKE 1 TABLET BY MOUTH EVERY MORNING FOR BLOOD PRESSURE 90 tablet 1  . insulin glargine (LANTUS) 100 UNIT/ML injection Inject 50 Units into the skin at bedtime.     . lisinopril  (PRINIVIL,ZESTRIL) 10 MG tablet TAKE 1 TABLET (10 MG TOTAL) BY MOUTH DAILY. 90 tablet 1  . meloxicam (MOBIC) 7.5 MG tablet TAKE 1 TABLET BY MOUTH 2 TIMES A DAY FOR ELBOW 180 tablet 0  . metFORMIN (GLUCOPHAGE) 500 MG tablet Take 1,000 mg by mouth 2 (two) times daily with a meal.     . pioglitazone (ACTOS) 30 MG tablet TAKE 1 TABLET BY MOUTH DAILY 90 tablet 0  . simvastatin (ZOCOR) 20 MG tablet Take 20 mg by mouth every evening.     No current facility-administered medications on file prior to visit.     BP (!) 120/56   Pulse 76   Temp 97.4 F (36.3 C) (Oral)   Ht 5' 11" (1.803 m)   Wt 212 lb 12.8 oz (96.5 kg)   SpO2 98%   BMI 29.68 kg/m    Objective:   Physical Exam  Constitutional: He appears well-nourished.  Neck: Neck supple.  Cardiovascular: Normal rate and regular rhythm.   Pulmonary/Chest: Effort normal and breath sounds normal.  Skin: Skin is warm and dry.          Assessment & Plan:   

## 2016-07-15 NOTE — Patient Instructions (Signed)
Complete lab work prior to leaving today.   Continue to work on making improvements in your diet. Limit canned soups, frozen dinners, processed carbohydrates (chips, packages snacks, sweets). Increase vegetables, fruit, whole grains.  Start exercising. You should be getting 150 minutes of moderate intensity exercise weekly.  Ensure you are consuming 64 ounces of water daily.  You were provided with pneumonia and influenza vaccinations today.  We will be in touch later today or tomorrow regarding your results.  It was a pleasure to see you today!  Diabetes Mellitus and Food It is important for you to manage your blood sugar (glucose) level. Your blood glucose level can be greatly affected by what you eat. Eating healthier foods in the appropriate amounts throughout the day at about the same time each day will help you control your blood glucose level. It can also help slow or prevent worsening of your diabetes mellitus. Healthy eating may even help you improve the level of your blood pressure and reach or maintain a healthy weight.  General recommendations for healthful eating and cooking habits include:  Eating meals and snacks regularly. Avoid going long periods of time without eating to lose weight.  Eating a diet that consists mainly of plant-based foods, such as fruits, vegetables, nuts, legumes, and whole grains.  Using low-heat cooking methods, such as baking, instead of high-heat cooking methods, such as deep frying. Work with your dietitian to make sure you understand how to use the Nutrition Facts information on food labels. HOW CAN FOOD AFFECT ME? Carbohydrates Carbohydrates affect your blood glucose level more than any other type of food. Your dietitian will help you determine how many carbohydrates to eat at each meal and teach you how to count carbohydrates. Counting carbohydrates is important to keep your blood glucose at a healthy level, especially if you are using insulin or  taking certain medicines for diabetes mellitus. Alcohol Alcohol can cause sudden decreases in blood glucose (hypoglycemia), especially if you use insulin or take certain medicines for diabetes mellitus. Hypoglycemia can be a life-threatening condition. Symptoms of hypoglycemia (sleepiness, dizziness, and disorientation) are similar to symptoms of having too much alcohol.  If your health care provider has given you approval to drink alcohol, do so in moderation and use the following guidelines:  Women should not have more than one drink per day, and men should not have more than two drinks per day. One drink is equal to:  12 oz of beer.  5 oz of wine.  1 oz of hard liquor.  Do not drink on an empty stomach.  Keep yourself hydrated. Have water, diet soda, or unsweetened iced tea.  Regular soda, juice, and other mixers might contain a lot of carbohydrates and should be counted. WHAT FOODS ARE NOT RECOMMENDED? As you make food choices, it is important to remember that all foods are not the same. Some foods have fewer nutrients per serving than other foods, even though they might have the same number of calories or carbohydrates. It is difficult to get your body what it needs when you eat foods with fewer nutrients. Examples of foods that you should avoid that are high in calories and carbohydrates but low in nutrients include:  Trans fats (most processed foods list trans fats on the Nutrition Facts label).  Regular soda.  Juice.  Candy.  Sweets, such as cake, pie, doughnuts, and cookies.  Fried foods. WHAT FOODS CAN I EAT? Eat nutrient-rich foods, which will nourish your body and keep you  healthy. The food you should eat also will depend on several factors, including:  The calories you need.  The medicines you take.  Your weight.  Your blood glucose level.  Your blood pressure level.  Your cholesterol level. You should eat a variety of foods, including:  Protein.  Lean  cuts of meat.  Proteins low in saturated fats, such as fish, egg whites, and beans. Avoid processed meats.  Fruits and vegetables.  Fruits and vegetables that may help control blood glucose levels, such as apples, mangoes, and yams.  Dairy products.  Choose fat-free or low-fat dairy products, such as milk, yogurt, and cheese.  Grains, bread, pasta, and rice.  Choose whole grain products, such as multigrain bread, whole oats, and brown rice. These foods may help control blood pressure.  Fats.  Foods containing healthful fats, such as nuts, avocado, olive oil, canola oil, and fish. DOES EVERYONE WITH DIABETES MELLITUS HAVE THE SAME MEAL PLAN? Because every person with diabetes mellitus is different, there is not one meal plan that works for everyone. It is very important that you meet with a dietitian who will help you create a meal plan that is just right for you.   This information is not intended to replace advice given to you by your health care provider. Make sure you discuss any questions you have with your health care provider.   Document Released: 05/30/2005 Document Revised: 09/23/2014 Document Reviewed: 07/30/2013 Elsevier Interactive Patient Education [employer].

## 2016-07-15 NOTE — Addendum Note (Signed)
Addended by: Jacqualin Combes on: 07/15/2016 09:38 AM   Modules accepted: Orders

## 2016-07-15 NOTE — Progress Notes (Signed)
Pre visit review using our clinic review tool, if applicable. No additional management support is needed unless otherwise documented below in the visit note. 

## 2016-07-16 ENCOUNTER — Other Ambulatory Visit: Payer: Self-pay | Admitting: Primary Care

## 2016-07-16 DIAGNOSIS — E118 Type 2 diabetes mellitus with unspecified complications: Secondary | ICD-10-CM

## 2016-07-16 DIAGNOSIS — Z794 Long term (current) use of insulin: Secondary | ICD-10-CM

## 2016-07-17 NOTE — Telephone Encounter (Signed)
Ok to refill? Electronically refill request for   LANTUS SOLOSTAR 100 UNIT/ML Solostar Pen   Medication have not been prescribed by Lawrence Parker. Last seen on 07/15/2016. Follow up on 10/16/2016.

## 2016-08-01 DIAGNOSIS — M9903 Segmental and somatic dysfunction of lumbar region: Secondary | ICD-10-CM | POA: Diagnosis not present

## 2016-08-01 DIAGNOSIS — M5416 Radiculopathy, lumbar region: Secondary | ICD-10-CM | POA: Diagnosis not present

## 2016-08-01 DIAGNOSIS — M5033 Other cervical disc degeneration, cervicothoracic region: Secondary | ICD-10-CM | POA: Diagnosis not present

## 2016-08-01 DIAGNOSIS — M9901 Segmental and somatic dysfunction of cervical region: Secondary | ICD-10-CM | POA: Diagnosis not present

## 2016-08-07 ENCOUNTER — Other Ambulatory Visit: Payer: Self-pay | Admitting: Primary Care

## 2016-08-14 DIAGNOSIS — H2513 Age-related nuclear cataract, bilateral: Secondary | ICD-10-CM | POA: Diagnosis not present

## 2016-08-14 DIAGNOSIS — E113393 Type 2 diabetes mellitus with moderate nonproliferative diabetic retinopathy without macular edema, bilateral: Secondary | ICD-10-CM | POA: Diagnosis not present

## 2016-08-14 DIAGNOSIS — H3582 Retinal ischemia: Secondary | ICD-10-CM | POA: Diagnosis not present

## 2016-08-28 DIAGNOSIS — M5033 Other cervical disc degeneration, cervicothoracic region: Secondary | ICD-10-CM | POA: Diagnosis not present

## 2016-08-28 DIAGNOSIS — M9901 Segmental and somatic dysfunction of cervical region: Secondary | ICD-10-CM | POA: Diagnosis not present

## 2016-08-28 DIAGNOSIS — M9903 Segmental and somatic dysfunction of lumbar region: Secondary | ICD-10-CM | POA: Diagnosis not present

## 2016-08-28 DIAGNOSIS — M5416 Radiculopathy, lumbar region: Secondary | ICD-10-CM | POA: Diagnosis not present

## 2016-08-30 ENCOUNTER — Other Ambulatory Visit: Payer: Self-pay | Admitting: Primary Care

## 2016-08-30 DIAGNOSIS — M1990 Unspecified osteoarthritis, unspecified site: Secondary | ICD-10-CM

## 2016-09-13 ENCOUNTER — Other Ambulatory Visit: Payer: Self-pay | Admitting: Primary Care

## 2016-09-13 DIAGNOSIS — H2513 Age-related nuclear cataract, bilateral: Secondary | ICD-10-CM | POA: Diagnosis not present

## 2016-09-13 DIAGNOSIS — E119 Type 2 diabetes mellitus without complications: Secondary | ICD-10-CM

## 2016-09-13 DIAGNOSIS — H02839 Dermatochalasis of unspecified eye, unspecified eyelid: Secondary | ICD-10-CM | POA: Diagnosis not present

## 2016-09-13 DIAGNOSIS — E113393 Type 2 diabetes mellitus with moderate nonproliferative diabetic retinopathy without macular edema, bilateral: Secondary | ICD-10-CM | POA: Diagnosis not present

## 2016-09-13 DIAGNOSIS — H2512 Age-related nuclear cataract, left eye: Secondary | ICD-10-CM | POA: Diagnosis not present

## 2016-09-13 DIAGNOSIS — H18413 Arcus senilis, bilateral: Secondary | ICD-10-CM | POA: Diagnosis not present

## 2016-09-24 DIAGNOSIS — M5416 Radiculopathy, lumbar region: Secondary | ICD-10-CM | POA: Diagnosis not present

## 2016-09-24 DIAGNOSIS — M5033 Other cervical disc degeneration, cervicothoracic region: Secondary | ICD-10-CM | POA: Diagnosis not present

## 2016-09-24 DIAGNOSIS — M9903 Segmental and somatic dysfunction of lumbar region: Secondary | ICD-10-CM | POA: Diagnosis not present

## 2016-09-24 DIAGNOSIS — M9901 Segmental and somatic dysfunction of cervical region: Secondary | ICD-10-CM | POA: Diagnosis not present

## 2016-10-03 ENCOUNTER — Other Ambulatory Visit: Payer: Self-pay | Admitting: Primary Care

## 2016-10-03 DIAGNOSIS — J302 Other seasonal allergic rhinitis: Secondary | ICD-10-CM

## 2016-10-16 ENCOUNTER — Ambulatory Visit (INDEPENDENT_AMBULATORY_CARE_PROVIDER_SITE_OTHER): Payer: Medicare HMO | Admitting: Primary Care

## 2016-10-16 ENCOUNTER — Encounter: Payer: Self-pay | Admitting: Primary Care

## 2016-10-16 VITALS — BP 124/80 | HR 76 | Temp 98.1°F | Ht 71.0 in | Wt 222.8 lb

## 2016-10-16 DIAGNOSIS — E118 Type 2 diabetes mellitus with unspecified complications: Secondary | ICD-10-CM

## 2016-10-16 DIAGNOSIS — Z794 Long term (current) use of insulin: Secondary | ICD-10-CM | POA: Diagnosis not present

## 2016-10-16 LAB — HEMOGLOBIN A1C: HEMOGLOBIN A1C: 7.8 % — AB (ref 4.6–6.5)

## 2016-10-16 NOTE — Progress Notes (Signed)
Pre visit review using our clinic review tool, if applicable. No additional management support is needed unless otherwise documented below in the visit note. 

## 2016-10-16 NOTE — Patient Instructions (Signed)
Complete lab work prior to leaving today. I will notify you of your results once received.   It is important that you improve your diet. Please limit carbohydrates in the form of white bread, rice, pasta, sweets, fast food, fried food, sugary drinks, etc. Increase your consumption of fresh fruits and vegetables, whole grains, lean protein.  Ensure you are consuming 64 ounces of water daily.  Start exercising. You should be getting 150 minutes of exercise weekly.  Please schedule a physical with me in 6 months. You may also schedule a lab only appointment 3-4 days prior. We will discuss your lab results in detail during your physical.  It was a pleasure to see you today!

## 2016-10-16 NOTE — Progress Notes (Signed)
Subjective:    Patient ID: Lawrence Parker, male    DOB: 05-18-1946, 71 y.o.   MRN: [id number]  HPI  Lawrence Parker is a 71 year old male who presents today for follow up of diabetes.  He is currently managed on Glipizide 10 mg BID, Lantus 50 units at bedtime, Actos 30 mg daily, and Metformin 1000 mg BID. Historically from records his A1C ranges from 9-11. His last A1C in October was 7.4 which was a significant improvement. He is here today for recheck.  Since his last visit he's doing well. He's checking his sugars twice daily, fasting in the morning and before bedtime. His fasting sugars are 100 and his bedtime sugars are 120. He's working on improving his diet by limiting bread, increasing protein and salads. He does not exercise and is drinking a lot of diet sodas. He denies numbness/tingling, shortness of breath, chest pain.   Review of Systems  Constitutional: Negative for fatigue.  Respiratory: Negative for shortness of breath.   Cardiovascular: Negative for chest pain.  Neurological: Negative for dizziness and numbness.       Past Medical History:  Diagnosis Date  . Anxiety    takes Xanax prn  . Arthritis    neck  . Back pain    deteriorating disc  . Bladder neck obstruction   . Colon polyps   . Depression    hx of but doesn't take any medications  . Diabetes mellitus    takes Actos,Glipizide,Metformin,and Lantus  . Diverticula of colon   . History of kidney stones    also has one at present time   . Hyperlipidemia    takes Zocor daily  . Hypertension    takes Amlodipine daily and HCTZ daily  . Neck pain    cervical spondylosis  . Peyronie disease      Social History   Social History  . Marital status: Married    Spouse name: N/A  . Number of children: N/A  . Years of education: N/A   Occupational History  . Not on file.   Social History Main Topics  . Smoking status: Never Smoker  . Smokeless tobacco: Never Used     Comment: quit smoking 85yr ago    . Alcohol use No  . Drug use: No  . Sexual activity: Yes   Other Topics Concern  . Not on file   Social History Narrative   Married.   2 children. 2 grandchildren.   Retired. Drove trucks for 43 years.   Enjoys traveling, spending time with family.    Past Surgical History:  Procedure Laterality Date  . CERVICAL SPINE SURGERY    . CIRCUMCISION  20+yrs ago  . COLONOSCOPY    . NOSE SURGERY  20+yrs ago  . SHOULDER SURGERY     right 20+yrs ago    Family History  Problem Relation Age of Onset  . Anesthesia problems Neg Hx   . Hypotension Neg Hx   . Malignant hyperthermia Neg Hx   . Pseudochol deficiency Neg Hx     No Known Allergies  Current Outpatient Prescriptions on File Prior to Visit  Medication Sig Dispense Refill  . ACCU-CHEK FASTCLIX LANCETS MISC Check blood sugar twice a day and as directed. Dx E11.9 100 each 5  . amLODipine (NORVASC) 10 MG tablet Take 5 mg by mouth daily.     .Marland Kitchenaspirin 325 MG tablet Take 325 mg by mouth daily.    . Blood Glucose Monitoring  Suppl (ACCU-CHEK AVIVA PLUS) w/Device KIT Check blood sugar twice a day and as directed. Dx E11.9 1 kit 0  . fluticasone (FLONASE) 50 MCG/ACT nasal spray PLACE 2 SPRAYS INTO BOTH NOSTRILS DAILY. 48 g 1  . glipiZIDE (GLUCOTROL) 10 MG tablet Take 10 mg by mouth 2 (two) times daily before a meal.    . glucose blood (ACCU-CHEK AVIVA PLUS) test strip Check blood sugar twice a day and as directed. Dx E11.9 100 each 5  . hydrochlorothiazide (HYDRODIURIL) 25 MG tablet TAKE 1 TABLET BY MOUTH EVERY MORNING FOR BLOOD PRESSURE 90 tablet 1  . Insulin Glargine (LANTUS SOLOSTAR) 100 UNIT/ML Solostar Pen Inject 50 units at bedtime 45 mL 5  . insulin glargine (LANTUS) 100 UNIT/ML injection Inject 50 Units into the skin at bedtime.     Marland Kitchen lisinopril (PRINIVIL,ZESTRIL) 10 MG tablet TAKE 1 TABLET (10 MG TOTAL) BY MOUTH DAILY. 90 tablet 1  . meloxicam (MOBIC) 7.5 MG tablet TAKE 1 TABLET BY MOUTH 2 TIMES A DAY FOR ELBOW 180 tablet 0   . metFORMIN (GLUCOPHAGE) 500 MG tablet Take 1,000 mg by mouth 2 (two) times daily with a meal.     . pioglitazone (ACTOS) 30 MG tablet TAKE 1 TABLET BY MOUTH DAILY 90 tablet 0  . simvastatin (ZOCOR) 20 MG tablet TAKE 1 TABLET BY MOUTH EVERY EVENING WITH SUPPER 90 tablet 2   No current facility-administered medications on file prior to visit.     BP 124/80   Pulse 76   Temp 98.1 F (36.7 C) (Oral)   Ht '5\' 11"'  (1.803 m)   Wt 222 lb 12.8 oz (101.1 kg)   SpO2 96%   BMI 31.07 kg/m    Objective:   Physical Exam  Constitutional: He appears well-nourished.  Neck: Neck supple.  Cardiovascular: Normal rate and regular rhythm.   Pulmonary/Chest: Effort normal and breath sounds normal.  Skin: Skin is warm and dry.          Assessment & Plan:

## 2016-10-16 NOTE — Assessment & Plan Note (Signed)
Significant improvement in A1C to 7.4 in October 2017. Will recheck A1C today, fasting sugars should support stable A1C in the 7 range. If stable, then will not repeat for 6 months. Discussed healthy diet and exercise.

## 2016-11-02 ENCOUNTER — Other Ambulatory Visit: Payer: Self-pay | Admitting: Primary Care

## 2016-11-02 DIAGNOSIS — E119 Type 2 diabetes mellitus without complications: Secondary | ICD-10-CM

## 2016-11-04 NOTE — Telephone Encounter (Signed)
Ok to refill? Electronically refill request for glipiZIDE (GLUCOTROL) 10 MG tablet. Medication have not been prescribed by Anda Kraft. Last seen on 10/16/2016

## 2016-11-27 ENCOUNTER — Other Ambulatory Visit: Payer: Self-pay | Admitting: Primary Care

## 2016-11-27 DIAGNOSIS — M1990 Unspecified osteoarthritis, unspecified site: Secondary | ICD-10-CM

## 2016-11-29 ENCOUNTER — Ambulatory Visit (INDEPENDENT_AMBULATORY_CARE_PROVIDER_SITE_OTHER)
Admission: RE | Admit: 2016-11-29 | Discharge: 2016-11-29 | Disposition: A | Discharge status: Home / Self Care | Payer: Medicare HMO | Source: Ambulatory Visit | Attending: Family Medicine | Admitting: Family Medicine

## 2016-11-29 ENCOUNTER — Encounter: Payer: Self-pay | Admitting: Family Medicine

## 2016-11-29 ENCOUNTER — Encounter (INDEPENDENT_AMBULATORY_CARE_PROVIDER_SITE_OTHER): Payer: Self-pay

## 2016-11-29 ENCOUNTER — Ambulatory Visit (INDEPENDENT_AMBULATORY_CARE_PROVIDER_SITE_OTHER): Payer: Medicare HMO | Admitting: Family Medicine

## 2016-11-29 VITALS — BP 130/68 | HR 60 | Temp 98.6°F | Ht 71.0 in | Wt 231.5 lb

## 2016-11-29 DIAGNOSIS — M25562 Pain in left knee: Secondary | ICD-10-CM

## 2016-11-29 DIAGNOSIS — M1712 Unilateral primary osteoarthritis, left knee: Secondary | ICD-10-CM

## 2016-11-29 DIAGNOSIS — M25462 Effusion, left knee: Secondary | ICD-10-CM | POA: Diagnosis not present

## 2016-11-29 NOTE — Progress Notes (Signed)
Pre visit review using our clinic review tool, if applicable. No additional management support is needed unless otherwise documented below in the visit note. 

## 2016-11-29 NOTE — Progress Notes (Signed)
 Dr.  T. , MD, [facility] [address] [place], [address] Phone: [phone] Fax: [phone]  11/29/2016  Patient: Lawrence Parker, MRN: [id number], DOB: 04/27/1946, 70 y.o.  Primary Physician:  Clark,Katherine Kendal, NP   Chief Complaint  Patient presents with  . Knee Injury    left knee   Subjective:   Lawrence Parker is a 70 y.o. very pleasant male patient who presents with the following:  L knee:  Wrestling with dog the other day and 2 days ago, then stepped off of something and started to given way. Prior to this had been bothering him some common he did have some swelling, but after his injury 2 days ago it is swollen quite a bit more and it been much more painful.    Past Medical History, Surgical History, Social History, Family History, Problem List, Medications, and Allergies have been reviewed and updated if relevant.  Patient Active Problem List   Diagnosis Date Noted  . Type 2 diabetes mellitus (HCC) 04/12/2016  . Essential hypertension 04/12/2016  . Hyperlipidemia 04/12/2016  . Arthritis 04/12/2016    Past Medical History:  Diagnosis Date  . Anxiety    takes Xanax prn  . Arthritis    neck  . Back pain    deteriorating disc  . Bladder neck obstruction   . Colon polyps   . Depression    hx of but doesn't take any medications  . Diabetes mellitus    takes Actos,Glipizide,Metformin,and Lantus  . Diverticula of colon   . History of kidney stones    also has one at present time   . Hyperlipidemia    takes Zocor daily  . Hypertension    takes Amlodipine daily and HCTZ daily  . Neck pain    cervical spondylosis  . Peyronie disease     Past Surgical History:  Procedure Laterality Date  . CERVICAL SPINE SURGERY    . CIRCUMCISION  20+yrs ago  . COLONOSCOPY    . NOSE SURGERY  20+yrs ago  . SHOULDER SURGERY     right 20+yrs ago    Social History   Social History  . Marital  status: Married    Spouse name: N/A  . Number of children: N/A  . Years of education: N/A   Occupational History  . Not on file.   Social History Main Topics  . Smoking status: Never Smoker  . Smokeless tobacco: Never Used     Comment: quit smoking 25yrs ago  . Alcohol use No  . Drug use: No  . Sexual activity: Yes   Other Topics Concern  . Not on file   Social History Narrative   Married.   2 children. 2 grandchildren.   Retired. Drove trucks for 43 years.   Enjoys traveling, spending time with family.    Family History  Problem Relation Age of Onset  . Anesthesia problems Neg Hx   . Hypotension Neg Hx   . Malignant hyperthermia Neg Hx   . Pseudochol deficiency Neg Hx     No Known Allergies  Medication list reviewed and updated in full in [facility].  GEN: No fevers, chills. Nontoxic. Primarily MSK c/o today. MSK: Detailed in the HPI GI: tolerating PO intake without difficulty Neuro: No numbness, parasthesias, or tingling associated. Otherwise the pertinent positives of the ROS are noted above.   Objective:   BP 130/68 (BP Location: Right Arm, Patient Position: Sitting,   Cuff Size: Normal)   Pulse 60   Temp 98.6 F (37 C) (Oral)   Ht 5' 11" (1.803 m)   Wt 231 lb 8 oz (105 kg)   SpO2 97%   BMI 32.29 kg/m    GEN: WDWN, NAD, Non-toxic, Alert & Oriented x 3 HEENT: Atraumatic, Normocephalic.  Ears and Nose: No external deformity. EXTR: No clubbing/cyanosis/edema NEURO: Normal gait.  PSYCH: Normally interactive. Conversant. Not depressed or anxious appearing.  Calm demeanor.   Knee:  L Gait: Normal heel toe pattern ROM: lacks 3 deg ext, flexion to 95 Effusion: mod Echymosis or edema: none Patellar tendon NT Painful PLICA: neg Patellar grind: negative Medial and lateral patellar facet loading: negative medial and lateral joint lines: medial Mcmurray's pain Flexion-pinch pain Varus and valgus stress: stable Lachman: neg Ant and Post drawer:  neg Hip abduction, IR, ER: WNL Hip flexion str: 5/5 Hip abd: 5/5 Quad: 5/5 VMO atrophy:No Hamstring concentric and eccentric: 5/5   Radiology: Dg Knee Ap/lat W/sunrise Left  Result Date: 11/29/2016 CLINICAL DATA:  Pain and swelling EXAM: LEFT KNEE 4 VIEWS COMPARISON:  Jan 26, 2008 FINDINGS: Weightbearing frontal, weight-bearing tunnel, weight-bearing lateral, and sunrise patellar images were obtained. There is no fracture or dislocation. There is a small joint effusion. There is moderately severe narrowing of the patellofemoral joint. There is also moderately severe narrowing medially. There is extensive chondrocalcinosis. There are foci of arterial vascular calcification. IMPRESSION: Osteoarthritic change, most notably medially and in the patellofemoral joint region. Extensive chondrocalcinosis, a finding that may be seen with osteoarthritis or with calcium pyrophosphate deposition disease. Small joint effusion. No fracture or dislocation. There is popliteal artery superficial femoral artery atherosclerosis. Electronically Signed   By: William  Woodruff III M.D.   On: 11/29/2016 12:57     Assessment and Plan:   Acute pain of left knee - Plan: DG Knee AP/LAT W/Sunrise Left  Primary osteoarthritis of left knee  Clinically, most likely acute meniscal tear. In the setting of a degenerative knee.  Ice, elevation  Knee Aspiration and Injection, L Patient verbally consented; risks, benefits, and alternatives explained including possible infection. Patient prepped with Chloraprep. Ethyl chloride for anesthesia. 10 cc of 1% Lidocaine used in wheal then injected Subcutaneous fashion with 22 gauge needle on lateral approach. Under sterilne conditions, 18 gauge needle used via lateral approach to aspirate 30 cc of serosanguinous fluid. Then 2 mL of Depo-Medrol 40 mg injected. Tolerated well, decreased pain, no complications.   Follow-up: if needed 3-4 weeks  Orders Placed This Encounter    Procedures  . DG Knee AP/LAT W/Sunrise Left    Signed,   T. , MD   Allergies as of 11/29/2016   No Known Allergies     Medication List       Accurate as of 11/29/16 11:59 PM. Always use your most recent med list.          ACCU-CHEK AVIVA PLUS w/Device Kit Check blood sugar twice a day and as directed. Dx E11.9   ACCU-CHEK FASTCLIX LANCETS Misc Check blood sugar twice a day and as directed. Dx E11.9   amLODipine 10 MG tablet Commonly known as:  NORVASC Take 5 mg by mouth daily.   aspirin 325 MG tablet Take 325 mg by mouth daily.   fluticasone 50 MCG/ACT nasal spray Commonly known as:  FLONASE PLACE 2 SPRAYS INTO BOTH NOSTRILS DAILY.   glipiZIDE 10 MG tablet Commonly known as:  GLUCOTROL TAKE 1 TABLET BY MOUTH TWICE DAILY     glucose blood test strip Commonly known as:  ACCU-CHEK AVIVA PLUS Check blood sugar twice a day and as directed. Dx E11.9   hydrochlorothiazide 25 MG tablet Commonly known as:  HYDRODIURIL TAKE 1 TABLET BY MOUTH EVERY MORNING FOR BLOOD PRESSURE   insulin glargine 100 UNIT/ML injection Commonly known as:  LANTUS Inject 50 Units into the skin at bedtime.   Insulin Glargine 100 UNIT/ML Solostar Pen Commonly known as:  LANTUS SOLOSTAR Inject 50 units at bedtime   lisinopril 10 MG tablet Commonly known as:  PRINIVIL,ZESTRIL TAKE 1 TABLET (10 MG TOTAL) BY MOUTH DAILY.   meloxicam 7.5 MG tablet Commonly known as:  MOBIC TAKE 1 TABLET BY MOUTH 2 TIMES A DAY FOR ELBOW   metFORMIN 500 MG tablet Commonly known as:  GLUCOPHAGE Take 1,000 mg by mouth 2 (two) times daily with a meal.   pioglitazone 30 MG tablet Commonly known as:  ACTOS TAKE 1 TABLET BY MOUTH DAILY   simvastatin 20 MG tablet Commonly known as:  ZOCOR TAKE 1 TABLET BY MOUTH EVERY EVENING WITH SUPPER

## 2016-12-04 DIAGNOSIS — H2513 Age-related nuclear cataract, bilateral: Secondary | ICD-10-CM | POA: Diagnosis not present

## 2016-12-04 DIAGNOSIS — E113293 Type 2 diabetes mellitus with mild nonproliferative diabetic retinopathy without macular edema, bilateral: Secondary | ICD-10-CM | POA: Diagnosis not present

## 2016-12-04 DIAGNOSIS — H11153 Pinguecula, bilateral: Secondary | ICD-10-CM | POA: Diagnosis not present

## 2016-12-04 DIAGNOSIS — H01009 Unspecified blepharitis unspecified eye, unspecified eyelid: Secondary | ICD-10-CM | POA: Diagnosis not present

## 2016-12-05 ENCOUNTER — Telehealth: Payer: Self-pay | Admitting: Primary Care

## 2016-12-05 DIAGNOSIS — H269 Unspecified cataract: Secondary | ICD-10-CM

## 2016-12-05 NOTE — Telephone Encounter (Signed)
Noted, referral placed.  

## 2016-12-05 NOTE — Telephone Encounter (Signed)
Pt needs to have cataract surgery, and needs a referral from pcp.  Dr Ky Barban, in [place], [place] [facility]  Phone number [phone] Fax number [phone] Number for pt is [phone]

## 2016-12-19 ENCOUNTER — Other Ambulatory Visit: Payer: Self-pay | Admitting: Primary Care

## 2016-12-19 DIAGNOSIS — I10 Essential (primary) hypertension: Secondary | ICD-10-CM

## 2016-12-20 ENCOUNTER — Other Ambulatory Visit: Payer: Self-pay | Admitting: Primary Care

## 2016-12-20 ENCOUNTER — Telehealth: Payer: Self-pay

## 2016-12-20 DIAGNOSIS — E118 Type 2 diabetes mellitus with unspecified complications: Secondary | ICD-10-CM

## 2016-12-20 DIAGNOSIS — Z794 Long term (current) use of insulin: Secondary | ICD-10-CM

## 2016-12-20 DIAGNOSIS — J302 Other seasonal allergic rhinitis: Secondary | ICD-10-CM

## 2016-12-20 DIAGNOSIS — E119 Type 2 diabetes mellitus without complications: Secondary | ICD-10-CM

## 2016-12-20 DIAGNOSIS — I10 Essential (primary) hypertension: Secondary | ICD-10-CM

## 2016-12-20 NOTE — Telephone Encounter (Signed)
Pt left v/m; pt is changing pharmacies to [facility]; pt said [facility] will fax requested info and wanted Allie Bossier NP and her nurse to be aware. FYI to [place].

## 2016-12-21 ENCOUNTER — Other Ambulatory Visit: Payer: Self-pay | Admitting: Primary Care

## 2016-12-23 MED ORDER — BD SWAB SINGLE USE REGULAR PADS: MEDICATED_PAD | 5 refills | Status: DC

## 2016-12-23 MED ORDER — INSULIN GLARGINE 100 UNIT/ML ~~LOC~~ SOLN: 50.0000 [IU] | Freq: Every day | SUBCUTANEOUS | 1 refills | Status: DC

## 2016-12-23 MED ORDER — HYDROCHLOROTHIAZIDE 25 MG PO TABS: ORAL_TABLET | ORAL | 1 refills | Status: DC

## 2016-12-23 MED ORDER — SIMVASTATIN 20 MG PO TABS: ORAL_TABLET | ORAL | 1 refills | Status: DC

## 2016-12-23 MED ORDER — ACCU-CHEK AVIVA VI SOLN: 1 refills | Status: DC

## 2016-12-23 MED ORDER — GLIPIZIDE 10 MG PO TABS: 10.0000 mg | ORAL_TABLET | Freq: Two times a day (BID) | ORAL | 3 refills | Status: DC

## 2016-12-23 MED ORDER — FLUTICASONE PROPIONATE 50 MCG/ACT NA SUSP: 2.0000 | Freq: Every day | NASAL | 1 refills | Status: DC

## 2016-12-23 MED ORDER — ACCU-CHEK FASTCLIX LANCETS MISC: 1 refills | Status: DC

## 2016-12-23 MED ORDER — METFORMIN HCL 500 MG PO TABS: 1000.0000 mg | ORAL_TABLET | Freq: Two times a day (BID) | ORAL | 1 refills | Status: DC

## 2016-12-23 MED ORDER — INSULIN GLARGINE 100 UNIT/ML SOLOSTAR PEN: PEN_INJECTOR | SUBCUTANEOUS | 5 refills | Status: DC

## 2016-12-23 NOTE — Telephone Encounter (Signed)
Received faxed refill request to mail order.  Will send as requested.

## 2016-12-24 MED ORDER — PIOGLITAZONE HCL 30 MG PO TABS: 30.0000 mg | ORAL_TABLET | Freq: Every day | ORAL | 1 refills | Status: DC

## 2016-12-24 MED ORDER — AMLODIPINE BESYLATE 10 MG PO TABS: 5.0000 mg | ORAL_TABLET | Freq: Every day | ORAL | 2 refills | Status: DC

## 2016-12-24 MED ORDER — GLUCOSE BLOOD VI STRP: ORAL_STRIP | 2 refills | Status: DC

## 2016-12-24 MED ORDER — ACCU-CHEK AVIVA PLUS W/DEVICE KIT: PACK | 0 refills | Status: DC

## 2016-12-26 NOTE — Addendum Note (Signed)
Addended by: Jacqualin Combes on: 12/26/2016 12:59 PM   Modules accepted: Orders

## 2016-12-31 ENCOUNTER — Other Ambulatory Visit: Payer: Self-pay | Admitting: Primary Care

## 2016-12-31 DIAGNOSIS — I10 Essential (primary) hypertension: Secondary | ICD-10-CM

## 2017-02-04 ENCOUNTER — Telehealth: Payer: Self-pay | Admitting: Primary Care

## 2017-02-04 NOTE — Telephone Encounter (Signed)
Patient Name: Lawrence Parker  DOB: Aug 12, 1946    Initial Comment Odyn states his hands are numb and tingling.   Nurse Assessment  Nurse: Mallie Mussel, RN, Alveta Heimlich Date/Time Eilene Ghazi Time): 02/04/2017 10:00:06 AM  Confirm and document reason for call. If symptomatic, describe symptoms. ---Caller states that both of his hands and numb and tingling. This began some time back while he was driving, but not its constant. He can still make a fist, open them up and grip items. He denies neck pain, but he did say he had neck surgery about 5 years ago.  Does the patient have any new or worsening symptoms? ---Yes  Will a triage be completed? ---Yes  Related visit to physician within the last 2 weeks? ---No  Does the PT have any chronic conditions? (i.e. diabetes, asthma, etc.) ---Yes  List chronic conditions. ---Diabetes  Is this a behavioral health or substance abuse call? ---No     Guidelines    Guideline Title Affirmed Question Affirmed Notes  Neurologic Deficit [1] Numbness or tingling in one or both hands AND [2] is a chronic symptom (recurrent or ongoing AND present > 4 weeks)    Final Disposition User   See PCP When Office is Open (within 3 days) Mallie Mussel, RN, Alveta Heimlich    Comments  Denies back pain at present.  Appointment scheduled for tomorrow morning at 9:30am with Alma Friendly NP.   Referrals  REFERRED TO PCP OFFICE   Disagree/Comply: Comply

## 2017-02-04 NOTE — Telephone Encounter (Signed)
Noted  

## 2017-02-04 NOTE — Telephone Encounter (Signed)
Pt has appt on 02/05/17 at 9:30 with Allie Bossier NP.

## 2017-02-05 ENCOUNTER — Encounter: Payer: Self-pay | Admitting: Primary Care

## 2017-02-05 ENCOUNTER — Ambulatory Visit (INDEPENDENT_AMBULATORY_CARE_PROVIDER_SITE_OTHER)
Admission: RE | Admit: 2017-02-05 | Discharge: 2017-02-05 | Disposition: A | Discharge status: Home / Self Care | Payer: Medicare HMO | Source: Ambulatory Visit | Attending: Primary Care | Admitting: Primary Care

## 2017-02-05 ENCOUNTER — Telehealth: Payer: Self-pay | Admitting: Primary Care

## 2017-02-05 ENCOUNTER — Ambulatory Visit (INDEPENDENT_AMBULATORY_CARE_PROVIDER_SITE_OTHER): Payer: Medicare HMO | Admitting: Primary Care

## 2017-02-05 VITALS — BP 136/70 | HR 64 | Temp 98.1°F | Ht 71.0 in | Wt 227.0 lb

## 2017-02-05 DIAGNOSIS — G8929 Other chronic pain: Secondary | ICD-10-CM | POA: Diagnosis not present

## 2017-02-05 DIAGNOSIS — R202 Paresthesia of skin: Secondary | ICD-10-CM

## 2017-02-05 DIAGNOSIS — M542 Cervicalgia: Secondary | ICD-10-CM

## 2017-02-05 LAB — BASIC METABOLIC PANEL
BUN: 18 mg/dL (ref 6–23)
CO2: 27 mEq/L (ref 19–32)
Calcium: 9.2 mg/dL (ref 8.4–10.5)
Chloride: 104 mEq/L (ref 96–112)
Creatinine, Ser: 1.09 mg/dL (ref 0.40–1.50)
GFR: 71.07 mL/min (ref >60.00)
GLUCOSE: 139 mg/dL — AB (ref 70–99)
Potassium: 4.4 mEq/L (ref 3.5–5.1)
Sodium: 137 mEq/L (ref 135–145)

## 2017-02-05 LAB — CBC
HCT: 35.4 % — ABNORMAL LOW (ref 39.0–52.0)
HEMOGLOBIN: 12.1 g/dL — AB (ref 13.0–17.0)
MCHC: 34.2 g/dL (ref 30.0–36.0)
MCV: 86.4 fl (ref 78.0–100.0)
PLATELETS: 154 10*3/uL (ref 150.0–400.0)
RBC: 4.09 Mil/uL — AB (ref 4.22–5.81)
RDW: 13.9 % (ref 11.5–15.5)
WBC: 7.6 10*3/uL (ref 4.0–10.5)

## 2017-02-05 LAB — VITAMIN B12: Vitamin B-12: 350 pg/mL (ref 211–911)

## 2017-02-05 LAB — HEMOGLOBIN A1C: HEMOGLOBIN A1C: 7.6 % — AB (ref 4.6–6.5)

## 2017-02-05 NOTE — Progress Notes (Signed)
Subjective:    Patient ID: Lawrence Parker, male    DOB: 1946/06/01, 71 y.o.   MRN: [id number]  HPI  [name] is a 71 year old male with a history of cervical spondylosis with history of cervical spine surgery in 2013 who presents today with a chief complaint of numbness/tingling. His symptoms are located to the bilateral palmer hands. He has a history of intermittent numbness/tingling that occurred while driving years ago, but over the past week his symptoms have been consistent. He does have generalized arthralgias and will experience neck and shoulder pain every morning upon waking that will improve gradually throughout the day.  He denies weakness, changes in speech, lower extremity numbness/tingling, injury/trauma.   Review of Systems  Constitutional: Negative for fatigue.  Musculoskeletal: Positive for arthralgias and neck pain. Negative for joint swelling.  Neurological: Positive for numbness. Negative for dizziness, tremors, speech difficulty, weakness and headaches.       Past Medical History:  Diagnosis Date  . Anxiety    takes Xanax prn  . Arthritis    neck  . Back pain    deteriorating disc  . Bladder neck obstruction   . Colon polyps   . Depression    hx of but doesn't take any medications  . Diabetes mellitus    takes Actos,Glipizide,Metformin,and Lantus  . Diverticula of colon   . History of kidney stones    also has one at present time   . Hyperlipidemia    takes Zocor daily  . Hypertension    takes Amlodipine daily and HCTZ daily  . Neck pain    cervical spondylosis  . Peyronie disease      Social History   Social History  . Marital status: Married    Spouse name: N/A  . Number of children: N/A  . Years of education: N/A   Occupational History  . Not on file.   Social History Main Topics  . Smoking status: Never Smoker  . Smokeless tobacco: Never Used     Comment: quit smoking 35yr ago  . Alcohol use No  . Drug use: No  . Sexual  activity: Yes   Other Topics Concern  . Not on file   Social History Narrative   Married.   2 children. 2 grandchildren.   Retired. Drove trucks for 43 years.   Enjoys traveling, spending time with family.    Past Surgical History:  Procedure Laterality Date  . CERVICAL SPINE SURGERY    . CIRCUMCISION  20+yrs ago  . COLONOSCOPY    . NOSE SURGERY  20+yrs ago  . SHOULDER SURGERY     right 20+yrs ago    Family History  Problem Relation Age of Onset  . Anesthesia problems Neg Hx   . Hypotension Neg Hx   . Malignant hyperthermia Neg Hx   . Pseudochol deficiency Neg Hx     No Known Allergies  Current Outpatient Prescriptions on File Prior to Visit  Medication Sig Dispense Refill  . ACCU-CHEK FASTCLIX LANCETS MISC Check blood sugar twice a day and as directed. Dx E11.9 300 each 1  . Alcohol Swabs (B-D SINGLE USE SWABS REGULAR) PADS Use as needed to check blood sugar. 300 each 5  . aspirin 325 MG tablet Take 325 mg by mouth daily.    . Blood Glucose Calibration (ACCU-CHEK AVIVA) SOLN Use as instructed. 1 each 1  . Blood Glucose Monitoring Suppl (ACCU-CHEK AVIVA PLUS) w/Device KIT Check blood sugar twice a day  and as directed. Dx E11.9 1 kit 0  . fluticasone (FLONASE) 50 MCG/ACT nasal spray Place 2 sprays into both nostrils daily. 48 g 1  . glipiZIDE (GLUCOTROL) 10 MG tablet Take 1 tablet (10 mg total) by mouth 2 (two) times daily. 180 tablet 3  . glucose blood (ACCU-CHEK AVIVA PLUS) test strip Check blood sugar twice a day and as directed. Dx E11.9 300 each 2  . hydrochlorothiazide (HYDRODIURIL) 25 MG tablet TAKE 1 TABLET BY MOUTH EVERY MORNING FOR BLOOD PRESSURE 90 tablet 1  . Insulin Glargine (LANTUS SOLOSTAR) 100 UNIT/ML Solostar Pen Inject 50 units at bedtime 45 mL 5  . lisinopril (PRINIVIL,ZESTRIL) 10 MG tablet TAKE 1 TABLET (10 MG TOTAL) BY MOUTH DAILY. 90 tablet 1  . meloxicam (MOBIC) 7.5 MG tablet TAKE 1 TABLET BY MOUTH 2 TIMES A DAY FOR ELBOW 180 tablet 0  . metFORMIN  (GLUCOPHAGE-XR) 500 MG 24 hr tablet TAKE 4 TABLETS BY MOUTH DAILY 180 tablet 0  . pioglitazone (ACTOS) 30 MG tablet Take 1 tablet (30 mg total) by mouth daily. 90 tablet 1  . simvastatin (ZOCOR) 20 MG tablet TAKE 1 TABLET BY MOUTH EVERY EVENING WITH SUPPER 90 tablet 1   No current facility-administered medications on file prior to visit.     BP 136/70   Pulse 64   Temp 98.1 F (36.7 C) (Oral)   Ht '5\' 11"'  (1.803 m)   Wt 227 lb (103 kg)   SpO2 96%   BMI 31.66 kg/m    Objective:   Physical Exam  Constitutional: He is oriented to person, place, and time. He appears well-nourished.  Eyes: EOM are normal. Pupils are equal, round, and reactive to light.  Cardiovascular: Normal rate and regular rhythm.   Pulmonary/Chest: Effort normal and breath sounds normal.  Musculoskeletal:       Right shoulder: He exhibits normal range of motion, no bony tenderness and no pain.       Left shoulder: He exhibits normal range of motion, no tenderness and no pain.       Cervical back: He exhibits decreased range of motion. He exhibits no bony tenderness, no pain and no spasm.  Mild decrease in ROM that is baseline from prior surgery. No spinal tenderness.  Neurological: He is alert and oriented to person, place, and time. He has normal reflexes. No cranial nerve deficit. Coordination normal.  Negative Tineal's and Phalen's sign          Assessment & Plan:

## 2017-02-05 NOTE — Assessment & Plan Note (Signed)
Intermittent years ago, now consistent. Could be related to cervical spine, check xray today. Exam overall unremarkable. Could be carpal tunnel, although negative Phalen's and Tineal's sign and doesn't work with hands frequently. Will also check B 12, A1C, CBC to rule out metabolic cause. Exam and symptoms negative for CVA.

## 2017-02-05 NOTE — Telephone Encounter (Addendum)
Based off of examination today he will need re-evaluation per Dr. Lorelei Pont who has been working with his left knee. Today's exam with mild effusion to left patella, left lower extremity without swelling. Please schedule him with Dr. Lorelei Pont at his convenience for complaints of his knee.   Please have him elevate his lower extremity when resting to reduce any swelling. He is managed on Amlodipine which could be contributing. Please have him see me again for the swelling in his left lower foot and lower extremity if unable to get in with [facility]. I cannot draw off the fluid in his knee like he had with Dr. Lorelei Pont.

## 2017-02-05 NOTE — Telephone Encounter (Signed)
Patient Name: Lawrence Parker  DOB: 15-Nov-1945    Initial Comment Caller states that his foot is swollen, knee, and ankle as well. He just saw his doctor about the numbeness in his hands as well.    Nurse Assessment  Nurse: Jimmey Ralph, RN, Lissa Date/Time (Eastern Time): 02/05/2017 4:21:53 PM  Confirm and document reason for call. If symptomatic, describe symptoms. ---Caller states that his foot is swollen, knee, and ankle as well. From my knee down to ankle is swollen on left. He just saw his doctor today about the numbness in his hands as well. I saw NP Belenda Cruise today and she stated I would have to reschedule with the Dr.  Does the patient have any new or worsening symptoms? ---Yes  Will a triage be completed? ---Yes  Related visit to physician within the last 2 weeks? ---Yes  Does the PT have any chronic conditions? (i.e. diabetes, asthma, etc.) ---Yes  List chronic conditions. ---diabetic and on insulin.  Is this a behavioral health or substance abuse call? ---No     Guidelines    Guideline Title Affirmed Question Affirmed Notes  Leg Swelling and Edema [1] MODERATE leg swelling (e.g., swelling extends up to knees) AND [2] new onset or worsening    Final Disposition User   See Physician within 24 Hours Hammonds, RN, Lissa    Referrals  REFERRED TO PCP OFFICE   Disagree/Comply: Comply

## 2017-02-05 NOTE — Telephone Encounter (Signed)
Pt has appt 02/06/17 at 8:30 with Dr Danise Mina. Dr Danise Mina is out of office; will send to Dr Danise Mina and Allie Bossier NP.

## 2017-02-05 NOTE — Patient Instructions (Addendum)
Complete lab work and xray prior to leaving today. I will notify you of your results once received.   It was a pleasure to see you today!

## 2017-02-06 ENCOUNTER — Ambulatory Visit (INDEPENDENT_AMBULATORY_CARE_PROVIDER_SITE_OTHER): Payer: Medicare HMO | Admitting: Sports Medicine

## 2017-02-06 ENCOUNTER — Ambulatory Visit: Payer: Self-pay | Admitting: Family Medicine

## 2017-02-06 ENCOUNTER — Encounter: Payer: Self-pay | Admitting: Sports Medicine

## 2017-02-06 ENCOUNTER — Ambulatory Visit: Payer: Self-pay

## 2017-02-06 VITALS — BP 132/62 | HR 71 | Ht 71.0 in | Wt 227.2 lb

## 2017-02-06 DIAGNOSIS — Z794 Long term (current) use of insulin: Secondary | ICD-10-CM | POA: Diagnosis not present

## 2017-02-06 DIAGNOSIS — E118 Type 2 diabetes mellitus with unspecified complications: Secondary | ICD-10-CM | POA: Diagnosis not present

## 2017-02-06 DIAGNOSIS — M1712 Unilateral primary osteoarthritis, left knee: Secondary | ICD-10-CM

## 2017-02-06 DIAGNOSIS — I10 Essential (primary) hypertension: Secondary | ICD-10-CM | POA: Diagnosis not present

## 2017-02-06 NOTE — Assessment & Plan Note (Addendum)
Discussed the patient blood sugars have been well controlled recently. Compression discussed and reviewed. Given high blood pressure also like to avoid NSAIDs if possible failure injection today.   +++++++++++++++++++++++++++++++++++++++++++++++++++++++++++++++++++++++++++++ PROCEDURE NOTE - ULTRASOUND GUIDED ASPIRATION & INJECTION: Left Knee Images were obtained and interpreted by myself, Teresa Coombs, DO  Images have been saved and stored to PACS system. Images obtained on: GE S7 Ultrasound machine  ULTRASOUND FINDINGS:  Large effusion  DESCRIPTION OF PROCEDURE:  The patient's clinical condition is marked by substantial pain and/or significant functional disability. Other conservative therapy has not provided relief, is contraindicated, or not appropriate. There is a reasonable likelihood that injection will significantly improve the patient's pain and/or functional impairment. After discussing the risks, benefits and expected outcomes of the injection and all questions were reviewed and answered, the patient wished to undergo the above named procedure. Verbal consent was obtained. The ultrasound was used to identify the target structure and adjacent neurovascular structures. The skin was then prepped in sterile fashion and the target structure was injected under direct visualization using sterile technique as below: PREP: Alcohol, Ethel Chloride, 5cc 1% lidocaine on 25 needle APPROACH: Superiolateral, stopcock technique, 18 g 1.5" needle INJECTATE: 2 cc 0.5% marcaine, 2cc 40mg  DepoMedrol ASPIRATE: 35cc straw colored fluid  DRESSING: Band-Aid   Post procedural instructions including recommending icing and warning signs for infection were reviewed. This procedure was well tolerated and there were no complications.   IMPRESSION: Succesful US Guided Aspiration & injection

## 2017-02-06 NOTE — Telephone Encounter (Signed)
Spoken and notified patient of Kate's comments. Patient verbalized understanding. 

## 2017-02-06 NOTE — Progress Notes (Signed)
OFFICE VISIT NOTE Lawrence Parker. Lawrence Parker, [facility]  Lawrence Parker - 71 y.o. male MRN [id number]  Date of birth: 10/28/1945  Visit Date: 02/06/2017  PCP: Pleas Koch, NP   Referred by: Pleas Koch, NP  Burlene Arnt, CMA acting as scribe for Dr. Paulla Fore.  SUBJECTIVE:   Chief Complaint  Patient presents with  . swelling in left knee   HPI: As below and per problem based documentation when appropriate.  Pt presents today for swelling in the left knee and ankle.  This is a chronic issue but swelling got worse last night and his knee feels very sore. Pt does have some pain at the injection site from when he had his knee drained in March.   The pain is described as dull ache and is rated as 2/10.  Pt can't recall if anything makes the swelling worse.  Improves with elevation Therapies tried include none  Other associated symptoms include: none  Pt denies fever, chills, night sweats.    Review of Systems  Constitutional: Negative for chills and fever.  HENT: Negative.   Eyes: Negative.   Respiratory: Positive for shortness of breath. Negative for wheezing.   Cardiovascular: Positive for leg swelling. Negative for chest pain and palpitations.  Musculoskeletal: Positive for joint pain. Negative for falls.  Skin: Negative.   Neurological: Positive for tingling. Negative for dizziness and headaches.  Endo/Heme/Allergies: Positive for environmental allergies.    Otherwise per HPI.  HISTORY & PERTINENT PRIOR DATA:  No specialty comments available. He reports that he has never smoked. He has never used smokeless tobacco.   Recent Labs  07/15/16 0915 10/16/16 0919 02/05/17 1030  HGBA1C 7.4* 7.8* 7.6*   Medications & Allergies reviewed per EMR Patient Active Problem List   Diagnosis Date Noted  . OA (osteoarthritis) of knee 02/06/2017  . Paresthesia of both hands 02/05/2017  . Type 2  diabetes mellitus ([place]) 04/12/2016  . Essential hypertension 04/12/2016  . Hyperlipidemia 04/12/2016  . Arthritis 04/12/2016   Past Medical History:  Diagnosis Date  . Anxiety    takes Xanax prn  . Arthritis    neck  . Back pain    deteriorating disc  . Bladder neck obstruction   . Colon polyps   . Depression    hx of but doesn't take any medications  . Diabetes mellitus    takes Actos,Glipizide,Metformin,and Lantus  . Diverticula of colon   . History of kidney stones    also has one at present time   . Hyperlipidemia    takes Zocor daily  . Hypertension    takes Amlodipine daily and HCTZ daily  . Neck pain    cervical spondylosis  . Peyronie disease    Family History  Problem Relation Age of Onset  . Anesthesia problems Neg Hx   . Hypotension Neg Hx   . Malignant hyperthermia Neg Hx   . Pseudochol deficiency Neg Hx    Past Surgical History:  Procedure Laterality Date  . CERVICAL SPINE SURGERY    . CIRCUMCISION  20+yrs ago  . COLONOSCOPY    . NOSE SURGERY  20+yrs ago  . SHOULDER SURGERY     right 20+yrs ago   Social History   Occupational History  . Not on file.   Social History Main Topics  . Smoking status: Never Smoker  . Smokeless tobacco: Never Used     Comment: quit smoking 27yrs  ago  . Alcohol use No  . Drug use: No  . Sexual activity: Yes    OBJECTIVE:  VS:  HT:5\' 11"  (180.3 cm)   WT:227 lb 3.2 oz (103.1 kg)  BMI:31.8    BP:132/62  HR:71bpm  TEMP: ( )  RESP:94 % EXAM: Findings:  WDWN, NAD, Non-toxic appearing Alert & appropriately interactive Not depressed or anxious appearing No increased work of breathing. Pupils are equal. EOM intact without nystagmus No clubbing or cyanosis of the extremities appreciated No significant rashes/lesions/ulcerations overlying the examined area. DP & PT pulses 2+/4.  No significant pretibial edema.  No clubbing or cyanosis Sensation intact to light touch in lower extremities.   LEFT Knee: Overall  joint is well aligned, no significant deformity.   Large effusion..   ROM: 5 to 90.   Extensor mechanism intact Generalized TTP.Marland Kitchen 2-3 mm opening with varus and valgus stressing.  Stable anterior posterior drawer.   Painful McMurray's.  Thessaly deferred..         ASSESSMENT & PLAN:   Problem List Items Addressed This Visit    Type 2 diabetes mellitus ([place])   Essential hypertension   OA (osteoarthritis) of knee - Primary    PROCEDURE NOTE - ULTRASOUND GUIDED ASPIRATION & INJECTION: Left Knee Images were obtained and interpreted by myself, Teresa Coombs, DO  Images have been saved and stored to PACS system. Images obtained on: GE S7 Ultrasound machine  ULTRASOUND FINDINGS:  Large effusion  DESCRIPTION OF PROCEDURE:  The patient's clinical condition is marked by substantial pain and/or significant functional disability. Other conservative therapy has not provided relief, is contraindicated, or not appropriate. There is a reasonable likelihood that injection will significantly improve the patient's pain and/or functional impairment. After discussing the risks, benefits and expected outcomes of the injection and all questions were reviewed and answered, the patient wished to undergo the above named procedure. Verbal consent was obtained. The ultrasound was used to identify the target structure and adjacent neurovascular structures. The skin was then prepped in sterile fashion and the target structure was injected under direct visualization using sterile technique as below: PREP: Alcohol, Ethel Chloride, 5cc 1% lidocaine on 25 needle APPROACH: Superiolateral, stopcock technique, 18 g 1.5" needle INJECTATE: 2 cc 0.5% marcaine, 2cc 40mg  DepoMedrol ASPIRATE: 35cc straw colored fluid  DRESSING: Band-Aid   Post procedural instructions including recommending icing and warning signs for infection were reviewed. This procedure was well tolerated and there were no complications.    IMPRESSION: Succesful US Guided Aspiration & injection       Relevant Orders   US GUIDED NEEDLE PLACEMENT(NO LINKED CHARGES)   Synovial cell count + diff, w/ crystals (Completed)      Follow-up: Return if symptoms worsen or fail to improve.   CMA/ATC served as Education administrator during this visit. History, Physical, and Plan performed by medical provider. Documentation and orders reviewed and attested to.      Teresa Coombs, [facility] Physician

## 2017-02-07 ENCOUNTER — Other Ambulatory Visit: Payer: Self-pay | Admitting: Primary Care

## 2017-02-07 DIAGNOSIS — R202 Paresthesia of skin: Secondary | ICD-10-CM

## 2017-02-07 LAB — SYNOVIAL CELL COUNT + DIFF, W/ CRYSTALS
Basophils, %: 0 %
Eosinophils-Synovial: 0 % (ref 0–2)
Lymphocytes-Synovial Fld: 79 % — ABNORMAL HIGH (ref 0–74)
Monocyte/Macrophage: 12 % (ref 0–69)
NEUTROPHIL, SYNOVIAL: 8 % (ref 0–24)
Synoviocytes, %: 1 % (ref 0–15)
WBC, SYNOVIAL: 51 {cells}/uL (ref <150)

## 2017-02-07 MED ORDER — GABAPENTIN 100 MG PO CAPS: ORAL_CAPSULE | ORAL | 0 refills | Status: DC

## 2017-02-14 ENCOUNTER — Telehealth: Payer: Self-pay

## 2017-02-14 NOTE — Telephone Encounter (Signed)
Pt left v/m has not received gabapentin from [facility]; per DPR left v/m for pt to contact [facility] and ck on status of gabapentin rx. If [employer] did not receive rx have pharmacy contact [facility].

## 2017-02-20 ENCOUNTER — Other Ambulatory Visit: Payer: Self-pay | Admitting: Primary Care

## 2017-02-20 DIAGNOSIS — Z794 Long term (current) use of insulin: Secondary | ICD-10-CM

## 2017-02-20 DIAGNOSIS — I10 Essential (primary) hypertension: Secondary | ICD-10-CM

## 2017-02-20 DIAGNOSIS — E118 Type 2 diabetes mellitus with unspecified complications: Secondary | ICD-10-CM

## 2017-02-20 DIAGNOSIS — M1990 Unspecified osteoarthritis, unspecified site: Secondary | ICD-10-CM

## 2017-02-20 MED ORDER — INSULIN GLARGINE 100 UNIT/ML SOLOSTAR PEN: PEN_INJECTOR | SUBCUTANEOUS | 1 refills | Status: DC

## 2017-02-20 MED ORDER — PIOGLITAZONE HCL 30 MG PO TABS: 30.0000 mg | ORAL_TABLET | Freq: Every day | ORAL | 1 refills | Status: DC

## 2017-02-20 MED ORDER — LISINOPRIL 10 MG PO TABS: 10.0000 mg | ORAL_TABLET | Freq: Every day | ORAL | 1 refills | Status: DC

## 2017-02-24 ENCOUNTER — Other Ambulatory Visit: Payer: Self-pay | Admitting: Primary Care

## 2017-02-24 DIAGNOSIS — M1990 Unspecified osteoarthritis, unspecified site: Secondary | ICD-10-CM

## 2017-02-25 MED ORDER — MELOXICAM 7.5 MG PO TABS: ORAL_TABLET | ORAL | 0 refills | Status: DC

## 2017-02-25 NOTE — Addendum Note (Signed)
Addended by: Jacqualin Combes on: 02/25/2017 08:43 AM   Modules accepted: Orders

## 2017-03-03 ENCOUNTER — Other Ambulatory Visit: Payer: Self-pay | Admitting: Primary Care

## 2017-03-03 DIAGNOSIS — M1990 Unspecified osteoarthritis, unspecified site: Secondary | ICD-10-CM

## 2017-03-18 ENCOUNTER — Other Ambulatory Visit: Payer: Self-pay | Admitting: Primary Care

## 2017-03-18 DIAGNOSIS — E119 Type 2 diabetes mellitus without complications: Secondary | ICD-10-CM

## 2017-03-20 ENCOUNTER — Encounter: Payer: Self-pay | Admitting: Sports Medicine

## 2017-03-20 ENCOUNTER — Ambulatory Visit (INDEPENDENT_AMBULATORY_CARE_PROVIDER_SITE_OTHER): Payer: Medicare HMO | Admitting: Sports Medicine

## 2017-03-20 VITALS — BP 130/60 | HR 77 | Ht 71.0 in | Wt 228.2 lb

## 2017-03-20 DIAGNOSIS — R600 Localized edema: Secondary | ICD-10-CM | POA: Diagnosis not present

## 2017-03-20 DIAGNOSIS — I10 Essential (primary) hypertension: Secondary | ICD-10-CM | POA: Diagnosis not present

## 2017-03-20 DIAGNOSIS — E118 Type 2 diabetes mellitus with unspecified complications: Secondary | ICD-10-CM | POA: Diagnosis not present

## 2017-03-20 DIAGNOSIS — M1712 Unilateral primary osteoarthritis, left knee: Secondary | ICD-10-CM

## 2017-03-20 DIAGNOSIS — Z794 Long term (current) use of insulin: Secondary | ICD-10-CM | POA: Diagnosis not present

## 2017-03-20 DIAGNOSIS — I739 Peripheral vascular disease, unspecified: Secondary | ICD-10-CM

## 2017-03-20 HISTORY — DX: Localized edema: R60.0

## 2017-03-20 NOTE — Patient Instructions (Signed)
Try getting over-the-counter 15-20 mmHg compression sleeves, knee-high from [facility] on [address] at [facility] (across from [facility] near [facility])  Let me know if you are interested in getting the ultrasound done to look at the blood flow in your legs.  We are going to get you preapproved for artificial joint fluid shots and can discuss this at your follow-up appointment.

## 2017-03-20 NOTE — Progress Notes (Signed)
OFFICE VISIT NOTE Lawrence Parker. Lawrence Parker, [place] [facility] [place]  NAZEER Parker - 71 y.o. male MRN [id number]  Date of birth: [birth year]  Visit Date: 03/20/2017  PCP: Pleas Koch, NP   Referred by: Pleas Koch, NP  Jari Sportsman, cma acting as scribe for Dr. Paulla Fore.  SUBJECTIVE:   Chief Complaint  Patient presents with  . Follow-up  . Osteoarthiris Left Knee   HPI: As below and per problem based documentation when appropriate.   Oluwaferanmi reports no improvement in lt knee pain with swelling since injection/aspiration on 02-06-2017. No crystals found in synovial fluid. At rest pain/swelling is minimal. No OTC medication therapies. Xray done on 11/29/2016. The pain is localized in patella. No radiation of sx.    Review of Systems  Constitutional: Negative for chills, diaphoresis, fever, malaise/fatigue and weight loss.  HENT: Negative.   Eyes: Negative.   Respiratory: Negative.   Cardiovascular: Negative.   Gastrointestinal: Negative.   Genitourinary: Negative.   Musculoskeletal: Positive for joint pain and myalgias. Negative for back pain, falls and neck pain.  Skin: Negative.   Neurological: Negative.  Negative for weakness.  Endo/Heme/Allergies: Negative for environmental allergies and polydipsia. Does not bruise/bleed easily.  Psychiatric/Behavioral: Negative.     Otherwise per HPI.  HISTORY & PERTINENT PRIOR DATA:  [employer] 20% coinsurance, deductible does not apply$45.00 copay if office visit is billed. 03/25/17 BLS He reports that he has never smoked. He has never used smokeless tobacco.   Recent Labs  07/15/16 0915 10/16/16 0919 02/05/17 1030  HGBA1C 7.4* 7.8* 7.6*   Medications & Allergies reviewed per EMR Patient Active Problem List   Diagnosis Date Noted  . Medicare annual wellness visit, subsequent 04/15/2017  . PAD (peripheral artery disease) ([place]) 03/20/2017  . Bilateral lower extremity  edema 03/20/2017  . Primary osteoarthritis of left knee 02/06/2017  . Paresthesia of both hands 02/05/2017  . Type 2 diabetes mellitus ([place]) 04/12/2016  . Essential hypertension 04/12/2016  . Hyperlipidemia 04/12/2016  . Arthritis 04/12/2016   Past Medical History:  Diagnosis Date  . Anxiety    takes Xanax prn  . Arthritis    neck  . Back pain    deteriorating disc  . Bladder neck obstruction   . Colon polyps   . Depression    hx of but doesn't take any medications  . Diabetes mellitus    takes Actos,Glipizide,Metformin,and Lantus  . Diverticula of colon   . History of kidney stones    also has one at present time   . Hyperlipidemia    takes Zocor daily  . Hypertension    takes Amlodipine daily and HCTZ daily  . Neck pain    cervical spondylosis  . Peyronie disease    Family History  Problem Relation Age of Onset  . Anesthesia problems Neg Hx   . Hypotension Neg Hx   . Malignant hyperthermia Neg Hx   . Pseudochol deficiency Neg Hx    Past Surgical History:  Procedure Laterality Date  . CERVICAL SPINE SURGERY    . CIRCUMCISION  20+yrs ago  . COLONOSCOPY    . NOSE SURGERY  20+yrs ago  . SHOULDER SURGERY     right 20+yrs ago   Social History   Occupational History  . Not on file.   Social History Main Topics  . Smoking status: Never Smoker  . Smokeless tobacco: Never Used     Comment: quit smoking 38yrs  ago  . Alcohol use No  . Drug use: No  . Sexual activity: Yes    OBJECTIVE:  VS:  HT:5\' 11"  (180.3 cm)   WT:228 lb 3.2 oz (103.5 kg)  BMI:31.9    BP:130/60  HR:77bpm  TEMP: ( )  RESP:98 % EXAM: Findings:  WDWN, NAD, Non-toxic appearing Alert & appropriately interactive Not depressed or anxious appearing No increased work of breathing. Pupils are equal. EOM intact without nystagmus No clubbing or cyanosis of the extremities appreciated Patient has chronic venous stasis changes without ulcerations.  He has only mild pretibial edema.  There are  brawny changes that are present.  DP and PT pulses are 1+/4 diffusely and symmetric.  Good capillary refill.  His lower extremity sensation is intact. Bilateral knees have generalized osteoarthritic bossing.  Only small effusions. 2-66mm of opening with varus and valgus strain bilaterally with solid endpoints.  Slight crepitation with McMurray's      No results found. ASSESSMENT & PLAN:     ICD-10-CM   1. Type 2 diabetes mellitus with complication, with long-term current use of insulin (HCC) E11.8    Z79.4   2. Essential hypertension I10   3. Primary osteoarthritis of left knee M17.12   4. Bilateral lower extremity edema R60.0   5. PAD (peripheral artery disease) (HCC) I73.9   ================================================================= Primary osteoarthritis of left knee There  are only minimal symptoms today of OA.  He has responded well to corticosteroid injections.  Given his diabetes and underlying other medical components he is not a good surgical candidate and would I would like to try to minimize use of corticosteroids.  We will plan to get him preapproved for Visco supplementation and can consider doing this in the future.  Given the underlying chronic venous stasis changes noted on his legs he would likely benefit from lower extremity compression from the foot to above the knee.  Should continue with knee braces that he has should add OTC compression sleeves to start.  His arterial pulses are good however could consider further evaluation of his PAD. ================================================================= Patient Instructions  Try getting over-the-counter 15-20 mmHg compression sleeves, knee-high from [facility] on [address] at [facility] (across from [facility] near [facility])  Let me know if you are interested in getting the ultrasound done to look at the blood flow in your legs.  We are going to get you preapproved for artificial joint fluid shots  and can discuss this at your follow-up appointment. =================================================================  Follow-up: Return in about 6 weeks (around 05/01/2017).   CMA/ATC served as Education administrator during this visit. History, Physical, and Plan performed by medical provider. Documentation and orders reviewed and attested to.      Teresa Coombs, [facility] Physician

## 2017-04-14 ENCOUNTER — Ambulatory Visit: Payer: Medicare HMO

## 2017-04-15 ENCOUNTER — Ambulatory Visit (INDEPENDENT_AMBULATORY_CARE_PROVIDER_SITE_OTHER): Payer: Medicare HMO | Admitting: Primary Care

## 2017-04-15 ENCOUNTER — Encounter: Payer: Self-pay | Admitting: Primary Care

## 2017-04-15 VITALS — BP 126/68 | HR 66 | Temp 97.8°F | Ht 71.0 in | Wt 230.4 lb

## 2017-04-15 DIAGNOSIS — I739 Peripheral vascular disease, unspecified: Secondary | ICD-10-CM | POA: Diagnosis not present

## 2017-04-15 DIAGNOSIS — E118 Type 2 diabetes mellitus with unspecified complications: Secondary | ICD-10-CM | POA: Diagnosis not present

## 2017-04-15 DIAGNOSIS — Z1159 Encounter for screening for other viral diseases: Secondary | ICD-10-CM

## 2017-04-15 DIAGNOSIS — Z0000 Encounter for general adult medical examination without abnormal findings: Secondary | ICD-10-CM | POA: Insufficient documentation

## 2017-04-15 DIAGNOSIS — M1712 Unilateral primary osteoarthritis, left knee: Secondary | ICD-10-CM | POA: Diagnosis not present

## 2017-04-15 DIAGNOSIS — E785 Hyperlipidemia, unspecified: Secondary | ICD-10-CM

## 2017-04-15 DIAGNOSIS — I10 Essential (primary) hypertension: Secondary | ICD-10-CM | POA: Diagnosis not present

## 2017-04-15 NOTE — Assessment & Plan Note (Signed)
Working with Sports medicine. Overall improved since recent evaluation. Recommended exercise with knee sleeve.

## 2017-04-15 NOTE — Assessment & Plan Note (Addendum)
PT 1+ bilaterally, DP 2+ bilaterally. Recommended regular activity/exercise. Continue aspirin and statin.

## 2017-04-15 NOTE — Assessment & Plan Note (Signed)
Stable in the office today, continue current regimen. 

## 2017-04-15 NOTE — Assessment & Plan Note (Signed)
There  are only minimal symptoms today of OA.  He has responded well to corticosteroid injections.  Given his diabetes and underlying other medical components he is not a good surgical candidate and would I would like to try to minimize use of corticosteroids.  We will plan to get him preapproved for Visco supplementation and can consider doing this in the future.  Given the underlying chronic venous stasis changes noted on his legs he would likely benefit from lower extremity compression from the foot to above the knee.  Should continue with knee braces that he has should add OTC compression sleeves to start.  His arterial pulses are good however could consider further evaluation of his PAD.

## 2017-04-15 NOTE — Assessment & Plan Note (Signed)
A1C from May 2018 with improvement to 7.6. Will repeat in 1 month. Discussed the importance of a healthy diet and regular exercise in order for weight loss, and to reduce the risk of other medical problems. No changes to medications, continue current regimen.

## 2017-04-15 NOTE — Patient Instructions (Signed)
Start exercising. You should be getting 150 minutes of moderate intensity exercise weekly.  Increase consumption of vegetables, fruit, whole grains.  Ensure you are consuming 64 ounces of water daily.  Please call me with the date of your last Colonoscopy.  Complete your diabetic eye exam annually.  Please notify me if you are interested in the Shingles vaccination.  Schedule a lab only appointment in 1 month to repeat your cholesterol and A1C. Come fasting to this appointment. No food for 8 hours. You may have water and black coffee.  Follow up in 6 months for re-evaluation or sooner if needed.  It was a pleasure to see you today!

## 2017-04-15 NOTE — Progress Notes (Signed)
Patient ID: Lawrence Parker, male   DOB: April 07, 1946, 71 y.o.   MRN: [id number]  HPI: Lawrence Parker is a 71 year old male who presents today for MWV.  Past Medical History:  Diagnosis Date  . Anxiety    takes Xanax prn  . Arthritis    neck  . Back pain    deteriorating disc  . Bladder neck obstruction   . Colon polyps   . Depression    hx of but doesn't take any medications  . Diabetes mellitus    takes Actos,Glipizide,Metformin,and Lantus  . Diverticula of colon   . History of kidney stones    also has one at present time   . Hyperlipidemia    takes Zocor daily  . Hypertension    takes Amlodipine daily and HCTZ daily  . Neck pain    cervical spondylosis  . Peyronie disease     Current Outpatient Prescriptions  Medication Sig Dispense Refill  . ACCU-CHEK FASTCLIX LANCETS MISC Check blood sugar twice a day and as directed. Dx E11.9 300 each 1  . Alcohol Swabs (B-D SINGLE USE SWABS REGULAR) PADS Use as needed to check blood sugar. 300 each 5  . amLODipine (NORVASC) 10 MG tablet Take 10 mg by mouth daily.    Marland Kitchen aspirin 325 MG tablet Take 325 mg by mouth daily.    . Blood Glucose Calibration (ACCU-CHEK AVIVA) SOLN Use as instructed. 1 each 1  . Blood Glucose Monitoring Suppl (ACCU-CHEK AVIVA PLUS) w/Device KIT Check blood sugar twice a day and as directed. Dx E11.9 1 kit 0  . fluticasone (FLONASE) 50 MCG/ACT nasal spray Place 2 sprays into both nostrils daily. 48 g 1  . gabapentin (NEURONTIN) 100 MG capsule Take 1 capsule by mouth twice daily for numbness to hands. 180 capsule 0  . glipiZIDE (GLUCOTROL) 10 MG tablet Take 1 tablet (10 mg total) by mouth 2 (two) times daily. 180 tablet 3  . glucose blood (ACCU-CHEK AVIVA PLUS) test strip Check blood sugar twice a day and as directed. Dx E11.9 300 each 2  . hydrochlorothiazide (HYDRODIURIL) 25 MG tablet TAKE 1 TABLET BY MOUTH EVERY MORNING FOR BLOOD PRESSURE 90 tablet 1  . Insulin Glargine (LANTUS SOLOSTAR) 100 UNIT/ML Solostar Pen  Inject 50 units at bedtime 45 mL 1  . lisinopril (PRINIVIL,ZESTRIL) 10 MG tablet Take 1 tablet (10 mg total) by mouth daily. 90 tablet 1  . meloxicam (MOBIC) 7.5 MG tablet TAKE 1 TABLET BY MOUTH 2 TIMES A DAY FOR ELBOW 180 tablet 0  . metFORMIN (GLUCOPHAGE) 500 MG tablet TAKE 2 TABLETS TWICE DAILY WITH MEALS 360 tablet 1  . pioglitazone (ACTOS) 30 MG tablet Take 1 tablet (30 mg total) by mouth daily. 90 tablet 1  . simvastatin (ZOCOR) 20 MG tablet TAKE 1 TABLET BY MOUTH EVERY EVENING WITH SUPPER 90 tablet 1   No current facility-administered medications for this visit.     No Known Allergies  Family History  Problem Relation Age of Onset  . Anesthesia problems Neg Hx   . Hypotension Neg Hx   . Malignant hyperthermia Neg Hx   . Pseudochol deficiency Neg Hx     Social History   Social History  . Marital status: Married    Spouse name: N/A  . Number of children: N/A  . Years of education: N/A   Occupational History  . Not on file.   Social History Main Topics  . Smoking status: Never Smoker  . Smokeless tobacco:  Never Used     Comment: quit smoking 82yr ago  . Alcohol use No  . Drug use: No  . Sexual activity: Yes   Other Topics Concern  . Not on file   Social History Narrative   Married.   2 children. 2 grandchildren.   Retired. Drove trucks for 43 years.   Enjoys traveling, spending time with family.    Hospitiliaztions: None  Health Maintenance:    Flu: Completed last season  Tetanus: Will check with insurance company for coverage  Pneumovax: Completed in 2013  Prevnar: Completed in 2017  Zostavax: Never completed. Declines.  Colonoscopy: Completed in 2006, believes he had one after this.  Eye Doctor: Completed in 2018. Has cataracts.   Dental Exam: No recent exam.  PSA: Normal in 2016, due in 2019  Hep C Screening: Due.    Providers: [facility] Friendly PCP; Optometry.   I have personally reviewed and have noted: 1. The patient's medical and social  history 2. Their use of alcohol, tobacco or illicit drugs 3. Their current medications and supplements 4. The patient's functional ability including ADL's, fall risks, home safety  risks and hearing or visual impairment. 5. Diet and physical activities 6. Evidence for depression or mood disorder  Subjective:   Review of Systems:   Constitutional: Denies fever, malaise, fatigue, headache or abrupt weight changes.  HEENT: Denies eye pain, eye redness, ear pain, ringing in the ears, wax buildup, runny nose, nasal congestion, bloody nose, or sore throat. Respiratory: Denies difficulty breathing, shortness of breath, cough or sputum production.   Cardiovascular: Denies chest pain, chest tightness, palpitations or swelling in the hands or feet.  Gastrointestinal: Denies abdominal pain, bloating, constipation, diarrhea or blood in the stool.  GU: Denies urgency, frequency, pain with urination, burning sensation, blood in urine, odor or discharge. Musculoskeletal: Some decrease in range of motion to left knee, history of bursitis. Overall doing better. Skin: Denies redness, rashes, lesions or ulcercations.  Neurological: Denies dizziness, difficulty with memory, difficulty with speech or problems with balance and coordination. Improvement in tingling to hands with gabapentin. Psychiatric: Denies concerns for anxiety or depression.   No other specific complaints in a complete review of systems (except as listed in HPI above).  Objective:  PE:   BP 126/68   Pulse 66   Temp 97.8 F (36.6 C) (Oral)   Ht '5\' 11"'  (1.803 m)   Wt 230 lb 6.4 oz (104.5 kg)   SpO2 95%   BMI 32.13 kg/m  Wt Readings from Last 3 Encounters:  04/15/17 230 lb 6.4 oz (104.5 kg)  03/20/17 228 lb 3.2 oz (103.5 kg)  02/06/17 227 lb 3.2 oz (103.1 kg)    General: Appears their stated age, well developed, well nourished in NAD. Skin: Warm, dry and intact. No rashes, lesions or ulcerations noted. HEENT: Head: normal  shape and size; Eyes: sclera white, no icterus, conjunctiva pink, PERRLA and EOMs intact; Ears: Tm's gray and intact, normal light reflex; Nose: mucosa pink and moist, septum midline; Throat/Mouth: Teeth present, mucosa pink and moist, no exudate, lesions or ulcerations noted.  Neck: Normal range of motion. Neck supple, trachea midline. No massses, lumps or thyromegaly present.  Cardiovascular: Normal rate and rhythm. S1,S2 noted.  No murmur, rubs or gallops noted.. No carotid bruits noted. Pulmonary/Chest: Normal effort and positive vesicular breath sounds. No respiratory distress. No wheezes, rales or ronchi noted.  Abdomen: Soft and nontender. Normal bowel sounds, no bruits noted. No distention or masses noted. Liver,  spleen and kidneys non palpable. Musculoskeletal: Slight decrease in range of motion of left knee. Mild joint swelling to left anterior patella. No difficulty with gait. Overall improved. Neurological: Alert and oriented. Cranial nerves II-XII intact. Coordination normal. +DTRs bilaterally. Psychiatric: Mood and affect normal. Behavior is normal. Judgment and thought content normal.     BMET    Component Value Date/Time   NA 137 02/05/2017 1030   K 4.4 02/05/2017 1030   CL 104 02/05/2017 1030   CO2 27 02/05/2017 1030   GLUCOSE 139 (H) 02/05/2017 1030   BUN 18 02/05/2017 1030   CREATININE 1.09 02/05/2017 1030   CALCIUM 9.2 02/05/2017 1030   GFRNONAA >90 11/08/2011 1125   GFRAA >90 11/08/2011 1125    Lipid Panel     Component Value Date/Time   CHOL 109 04/12/2016 1015   TRIG 105.0 04/12/2016 1015   HDL 44.10 04/12/2016 1015   CHOLHDL 2 04/12/2016 1015   VLDL 21.0 04/12/2016 1015   LDLCALC 43 04/12/2016 1015    CBC    Component Value Date/Time   WBC 7.6 02/05/2017 1030   RBC 4.09 (L) 02/05/2017 1030   HGB 12.1 (L) 02/05/2017 1030   HCT 35.4 (L) 02/05/2017 1030   PLT 154.0 02/05/2017 1030   MCV 86.4 02/05/2017 1030   MCH 28.3 11/08/2011 1125   MCHC 34.2  02/05/2017 1030   RDW 13.9 02/05/2017 1030    Hgb A1C Lab Results  Component Value Date   HGBA1C 7.6 (H) 02/05/2017      Assessment and Plan:   Medicare Annual Wellness Visit:  Diet: He endorses a healthy diet. Breakfast: Fruit loops cereal Lunch: Salad, restaruants Dinner: Salad, restaurants, vegetables, chicken, rice, sweet potatoes  Snacks: None Desserts: Occasionally Beverages: Water, diet drinks, occasional juice Physical activity: He is active around his home, no regular exercise. Depression/mood screen: Negative Hearing: Intact to whispered voice Visual acuity: Grossly normal, performs eye exams, going to get cataracts removed son. ADLs: Capable Fall risk: None Home safety: Good Cognitive evaluation: Intact to orientation, naming, recall and repetition EOL planning: Adv directives not completed, packet provided today, full code/ I agree.   Preventative Medicine: Immunizations UTD. Declines Shingles. Will check on insurance coverage for Td. Colonoscopy UTD per patient, he will call with the last report date and results. PSA UTD. Exam unremarkable, stable. Labs due next month, he will schedule. Discussed the importance of a healthy diet and regular exercise in order for weight loss, and to reduce the risk of other medical problems. All recommendations provided at end of visit.   Next appointment: lab only appointment in 1 month, follow up appointment in 6 months.

## 2017-04-15 NOTE — Assessment & Plan Note (Signed)
Lipid panel due next month.

## 2017-04-15 NOTE — Assessment & Plan Note (Signed)
Immunizations UTD. Declines Shingles. Will check on insurance coverage for Td. Colonoscopy UTD per patient, he will call with the last report date and results. PSA UTD. Exam unremarkable, stable. Labs due next month, he will schedule. Discussed the importance of a healthy diet and regular exercise in order for weight loss, and to reduce the risk of other medical problems. All recommendations provided at end of visit.  I have personally reviewed and have noted: 1. The patient's medical and social history 2. Their use of alcohol, tobacco or illicit drugs 3. Their current medications and supplements 4. The patient's functional ability including ADL's, fall  risks, home safety risks and hearing or visual  impairment. 5. Diet and physical activities 6. Evidence for depression or mood disorder

## 2017-04-28 ENCOUNTER — Other Ambulatory Visit: Payer: Self-pay | Admitting: Primary Care

## 2017-04-28 DIAGNOSIS — M1990 Unspecified osteoarthritis, unspecified site: Secondary | ICD-10-CM

## 2017-05-12 ENCOUNTER — Other Ambulatory Visit: Payer: Self-pay | Admitting: Primary Care

## 2017-05-12 DIAGNOSIS — R202 Paresthesia of skin: Secondary | ICD-10-CM

## 2017-05-13 ENCOUNTER — Ambulatory Visit: Payer: Self-pay

## 2017-05-13 ENCOUNTER — Encounter: Payer: Self-pay | Admitting: Sports Medicine

## 2017-05-13 ENCOUNTER — Ambulatory Visit (INDEPENDENT_AMBULATORY_CARE_PROVIDER_SITE_OTHER): Payer: Medicare HMO | Admitting: Sports Medicine

## 2017-05-13 ENCOUNTER — Ambulatory Visit: Payer: Medicare HMO | Admitting: Sports Medicine

## 2017-05-13 VITALS — BP 136/60 | HR 72 | Ht 71.0 in | Wt 232.2 lb

## 2017-05-13 DIAGNOSIS — M1712 Unilateral primary osteoarthritis, left knee: Secondary | ICD-10-CM

## 2017-05-13 NOTE — Progress Notes (Signed)
OFFICE VISIT NOTE Lawrence Parker. [name], Lawrence Parker  Lawrence Parker - 71 y.o. male MRN [id number]  Date of birth: 09-29-1945  Visit Date: 05/13/2017  PCP: Pleas Koch, NP   Referred by: Pleas Koch, NP  Burlene Arnt, CMA acting as scribe for Dr. Paulla Fore.  SUBJECTIVE:   Chief Complaint  Patient presents with  . Follow-up    left knee pain   HPI: As below and per problem based documentation when appropriate.  Lawrence Parker is an established patient presenting today in follow-up of left knee pain. He was last seen in the office 03/20/17. He received steroid injection 02/06/17.  Pt c/o continued pain and swelling in the left knee. He reports that the pain is not that bad but the swelling is bothering him. He doesn't c/o pressure from the swelling.     Review of Systems  Constitutional: Negative for chills and fever.  Respiratory: Negative for shortness of breath and wheezing.   Cardiovascular: Positive for leg swelling. Negative for chest pain and palpitations.  Neurological: Negative for dizziness, tingling and headaches.  Endo/Heme/Allergies: Does not bruise/bleed easily.    Otherwise per HPI.  HISTORY & PERTINENT PRIOR DATA:  [employer] 20% coinsurance, deductible does not apply$45.00 copay if office visit is billed. 03/25/17 BLS He reports that he has never smoked. He has never used smokeless tobacco.   Recent Labs  07/15/16 0915 10/16/16 0919 02/05/17 1030  HGBA1C 7.4* 7.8* 7.6*   Medications & Allergies reviewed per EMR Patient Active Problem List   Diagnosis Date Noted  . Medicare annual wellness visit, subsequent 04/15/2017  . PAD (peripheral artery disease) ([place]) 03/20/2017  . Bilateral lower extremity edema 03/20/2017  . Primary osteoarthritis of left knee 02/06/2017  . Paresthesia of both hands 02/05/2017  . Type 2 diabetes mellitus ([place]) 04/12/2016  . Essential hypertension 04/12/2016    . Hyperlipidemia 04/12/2016  . Arthritis 04/12/2016   Past Medical History:  Diagnosis Date  . Anxiety    takes Xanax prn  . Arthritis    neck  . Back pain    deteriorating disc  . Bladder neck obstruction   . Colon polyps   . Depression    hx of but doesn't take any medications  . Diabetes mellitus    takes Actos,Glipizide,Metformin,and Lantus  . Diverticula of colon   . History of kidney stones    also has one at present time   . Hyperlipidemia    takes Zocor daily  . Hypertension    takes Amlodipine daily and HCTZ daily  . Neck pain    cervical spondylosis  . Peyronie disease    Family History  Problem Relation Age of Onset  . Anesthesia problems Neg Hx   . Hypotension Neg Hx   . Malignant hyperthermia Neg Hx   . Pseudochol deficiency Neg Hx    Past Surgical History:  Procedure Laterality Date  . CERVICAL SPINE SURGERY    . CIRCUMCISION  20+yrs ago  . COLONOSCOPY    . NOSE SURGERY  20+yrs ago  . SHOULDER SURGERY     right 20+yrs ago   Social History   Occupational History  . Not on file.   Social History Main Topics  . Smoking status: Never Smoker  . Smokeless tobacco: Never Used     Comment: quit smoking 44yrs ago  . Alcohol use No  . Drug use: No  . Sexual activity: Yes  OBJECTIVE:  VS:  HT:5\' 11"  (180.3 cm)   WT:232 lb 3.2 oz (105.3 kg)  BMI:32.4    BP:136/60  HR:72bpm  TEMP: ( )  RESP:95 % EXAM: Findings:  Left knee overall well aligned.  He does have some generalized osteoarthritic bossing with large effusion.  Lower extremities have 1+ pitting edema bilaterally.  He has a trace effusion on the right.  Range of motion from 3 to 110.  Ligamentously he has 2-3 mm of opening with valgus stressing but this is to a solid endpoint.  No pain with McMurray's.     [place] Limited Joint Space Structures Low Left(no Linked Charges)  Result Date: 05/13/2017 Gerda Diss, DO     05/13/2017  2:29 PM PROCEDURE NOTE - ULTRASOUND GUIDED ASPIRATION &  INJECTION:  Left Knee - OrthoVisc #1/3 Images were obtained and interpreted by myself, Teresa Coombs, DO Images have been saved and stored to PACS system. Images obtained on: GE S7 Ultrasound machine  ULTRASOUND FINDINGS: Large effusion, moderate synovitis DESCRIPTION OF PROCEDURE: The patient's clinical condition is marked by substantial pain and/or significant functional disability. Other conservative therapy has not provided relief, is contraindicated, or not appropriate. There is a reasonable likelihood that injection will significantly improve the patient's pain and/or functional impairment. After discussing the risks, benefits and expected outcomes of the injection and all questions were reviewed and answered, the patient wished to undergo the above named procedure. Verbal consent was obtained. The ultrasound was used to identify the target structure and adjacent neurovascular structures. The skin was then prepped in sterile fashion and the target structure was injected under direct visualization using sterile technique as below: PREP: Alcohol, Ethel Chloride, 3cc 1% lidocaine on 25 needle APPROACH: Superiolateral, stopcock technique, 18g 1.5" needle INJECTATE: 2cc OrthoVisc ASPIRATE:  45cc straw colored serous fluid DRESSING: Band-Aid and 6" Ace-Wrap Post procedural instructions including recommending icing and warning signs for infection were reviewed. This procedure was well tolerated and there were no complications.  IMPRESSION: Succesful US Guided Aspiration & injection   ASSESSMENT & PLAN:     ICD-10-CM   1. Primary osteoarthritis of left knee M17.12 [place] LIMITED JOINT SPACE STRUCTURES LOW LEFT(NO LINKED CHARGES)  ================================================================= Primary osteoarthritis of left knee Orthovisc series #1 provided today.  We will have him return for total of 3 and will plan to have him continue using compression on a regular basis and avoid exacerbating activities  including getting down his knees which he feels like did significantly flare him up past month. =================================================================  Follow-up: Return in about 1 week (around 05/20/2017) for OrthoVisc #2 of 3.   CMA/ATC served as Education administrator during this visit. History, Physical, and Plan performed by medical provider. Documentation and orders reviewed and attested to.      Teresa Coombs, [facility] Physician

## 2017-05-13 NOTE — Assessment & Plan Note (Signed)
Orthovisc series #1 provided today.  We will have him return for total of 3 and will plan to have him continue using compression on a regular basis and avoid exacerbating activities including getting down his knees which he feels like did significantly flare him up past month.

## 2017-05-13 NOTE — Procedures (Signed)
PROCEDURE NOTE - ULTRASOUND GUIDED ASPIRATION & INJECTION:   Left Knee - OrthoVisc #1/3  Images were obtained and interpreted by myself, Teresa Coombs, DO  Images have been saved and stored to PACS system. Images obtained on: GE S7 Ultrasound machine  ULTRASOUND FINDINGS: Large effusion, moderate synovitis  DESCRIPTION OF PROCEDURE:  The patient's clinical condition is marked by substantial pain and/or significant functional disability. Other conservative therapy has not provided relief, is contraindicated, or not appropriate. There is a reasonable likelihood that injection will significantly improve the patient's pain and/or functional impairment. After discussing the risks, benefits and expected outcomes of the injection and all questions were reviewed and answered, the patient wished to undergo the above named procedure. Verbal consent was obtained. The ultrasound was used to identify the target structure and adjacent neurovascular structures. The skin was then prepped in sterile fashion and the target structure was injected under direct visualization using sterile technique as below: PREP: Alcohol, Ethel Chloride, 3cc 1% lidocaine on 25 needle APPROACH: Superiolateral, stopcock technique, 18g 1.5" needle INJECTATE: 2cc OrthoVisc ASPIRATE:  45cc straw colored serous fluid DRESSING: Band-Aid and 6" Ace-Wrap  Post procedural instructions including recommending icing and warning signs for infection were reviewed. This procedure was well tolerated and there were no complications.   IMPRESSION: Succesful US Guided Aspiration & injection

## 2017-05-16 ENCOUNTER — Other Ambulatory Visit (INDEPENDENT_AMBULATORY_CARE_PROVIDER_SITE_OTHER): Payer: Medicare HMO

## 2017-05-16 DIAGNOSIS — E785 Hyperlipidemia, unspecified: Secondary | ICD-10-CM | POA: Diagnosis not present

## 2017-05-16 DIAGNOSIS — E118 Type 2 diabetes mellitus with unspecified complications: Secondary | ICD-10-CM

## 2017-05-16 DIAGNOSIS — Z1159 Encounter for screening for other viral diseases: Secondary | ICD-10-CM | POA: Diagnosis not present

## 2017-05-16 LAB — COMPREHENSIVE METABOLIC PANEL
ALT: 17 U/L (ref 0–53)
AST: 18 U/L (ref 0–37)
Albumin: 4.1 g/dL (ref 3.5–5.2)
Alkaline Phosphatase: 30 U/L — ABNORMAL LOW (ref 39–117)
BILIRUBIN TOTAL: 0.5 mg/dL (ref 0.2–1.2)
BUN: 15 mg/dL (ref 6–23)
CO2: 29 meq/L (ref 19–32)
CREATININE: 1.11 mg/dL (ref 0.40–1.50)
Calcium: 9.3 mg/dL (ref 8.4–10.5)
Chloride: 104 mEq/L (ref 96–112)
GFR: 69.53 mL/min (ref >60.00)
GLUCOSE: 59 mg/dL — AB (ref 70–99)
Potassium: 4.1 mEq/L (ref 3.5–5.1)
Sodium: 137 mEq/L (ref 135–145)
Total Protein: 6.6 g/dL (ref 6.0–8.3)

## 2017-05-16 LAB — LIPID PANEL
CHOL/HDL RATIO: 3
Cholesterol: 107 mg/dL (ref 0–200)
HDL: 39.1 mg/dL (ref >39.00)
LDL Cholesterol: 47 mg/dL (ref 0–99)
NONHDL: 68.17
Triglycerides: 107 mg/dL (ref 0.0–149.0)
VLDL: 21.4 mg/dL (ref 0.0–40.0)

## 2017-05-16 LAB — HEMOGLOBIN A1C: Hgb A1c MFr Bld: 7.3 % — ABNORMAL HIGH (ref 4.6–6.5)

## 2017-05-17 LAB — HEPATITIS C ANTIBODY: HCV AB: NONREACTIVE

## 2017-05-21 ENCOUNTER — Encounter: Payer: Self-pay | Admitting: Sports Medicine

## 2017-05-21 ENCOUNTER — Ambulatory Visit (INDEPENDENT_AMBULATORY_CARE_PROVIDER_SITE_OTHER): Payer: Medicare HMO | Admitting: Sports Medicine

## 2017-05-21 ENCOUNTER — Ambulatory Visit: Payer: Self-pay

## 2017-05-21 VITALS — BP 150/70 | HR 62 | Ht 71.0 in | Wt 233.4 lb

## 2017-05-21 DIAGNOSIS — M1712 Unilateral primary osteoarthritis, left knee: Secondary | ICD-10-CM

## 2017-05-21 NOTE — Assessment & Plan Note (Signed)
Aspiration today revealed improved appearance of synovial fluid with less volume than last injection.  We will have him return for Orthovisc No. 3 of 3 in 1 week.  Procedure visit only.

## 2017-05-21 NOTE — Progress Notes (Signed)
OFFICE VISIT NOTE Lawrence Parker. Lawrence Parker, [place] [facility] [place]  Lawrence Parker - 71 y.o. male MRN [id number]  Date of birth: [birth year]  Visit Date: 05/21/2017  PCP: Pleas Koch, NP   Referred by: Pleas Koch, NP  Burlene Arnt, CMA acting as scribe for Dr. Paulla Fore.  SUBJECTIVE:   Chief Complaint  Patient presents with  . Follow-up    osteoarthritis of the LT knee   HPI: As below and per problem based documentation when appropriate.  Lawrence Parker is an established patient presenting today in follow-up of osteoarthritis of the left knee. He was last seen 05/13/2017 and received #1 Orthovisc injection. He was advised to continue using compression on the knee.   Pt reports improvement with knee pain after last injection. He continues to have swelling in the left knee and ankle. He has been wearing compression sleeve and compression sock. Pt hasn't been very active since his last visit, he is trying not to irritate his knee.     Review of Systems  Constitutional: Negative for chills and fever.  Respiratory: Negative for shortness of breath and wheezing.   Cardiovascular: Positive for leg swelling. Negative for chest pain and palpitations.  Musculoskeletal: Negative for falls.  Neurological: Negative for dizziness, tingling and headaches.  Endo/Heme/Allergies: Does not bruise/bleed easily.    Otherwise per HPI.  HISTORY & PERTINENT PRIOR DATA:  [employer] 20% coinsurance, deductible does not apply$45.00 copay if office visit is billed. 03/25/17 BLS He reports that he has never smoked. He has never used smokeless tobacco.   Recent Labs  10/16/16 0919 02/05/17 1030 05/16/17 0832  HGBA1C 7.8* 7.6* 7.3*   Medications & Allergies reviewed per EMR Patient Active Problem List   Diagnosis Date Noted  . Medicare annual wellness visit, subsequent 04/15/2017  . PAD (peripheral artery disease) ([place]) 03/20/2017  .  Bilateral lower extremity edema 03/20/2017  . Primary osteoarthritis of left knee 02/06/2017  . Paresthesia of both hands 02/05/2017  . Type 2 diabetes mellitus ([place]) 04/12/2016  . Essential hypertension 04/12/2016  . Hyperlipidemia 04/12/2016  . Arthritis 04/12/2016   Past Medical History:  Diagnosis Date  . Anxiety    takes Xanax prn  . Arthritis    neck  . Back pain    deteriorating disc  . Bladder neck obstruction   . Colon polyps   . Depression    hx of but doesn't take any medications  . Diabetes mellitus    takes Actos,Glipizide,Metformin,and Lantus  . Diverticula of colon   . History of kidney stones    also has one at present time   . Hyperlipidemia    takes Zocor daily  . Hypertension    takes Amlodipine daily and HCTZ daily  . Neck pain    cervical spondylosis  . Peyronie disease    Family History  Problem Relation Age of Onset  . Anesthesia problems Neg Hx   . Hypotension Neg Hx   . Malignant hyperthermia Neg Hx   . Pseudochol deficiency Neg Hx    Past Surgical History:  Procedure Laterality Date  . CERVICAL SPINE SURGERY    . CIRCUMCISION  20+yrs ago  . COLONOSCOPY    . NOSE SURGERY  20+yrs ago  . SHOULDER SURGERY     right 20+yrs ago   Social History   Occupational History  . Not on file.   Social History Main Topics  . Smoking status: Never Smoker  .  Smokeless tobacco: Never Used     Comment: quit smoking 14yrs ago  . Alcohol use No  . Drug use: No  . Sexual activity: Yes    OBJECTIVE:  VS:  HT:5\' 11"  (180.3 cm)   WT:233 lb 6.4 oz (105.9 kg)  BMI:32.57    BP:(!) 150/70  HR:62bpm  TEMP: ( )  RESP:96 % EXAM: Findings:  Left knee has a less tense but persistently large effusion.  He is ligamentously stable.     US Guided Needle Placement(no Linked Charges)  Result Date: 05/21/2017 Gerda Diss, DO     05/21/2017  9:36 AM PROCEDURE NOTE - ULTRASOUND GUIDED ASPIRATION & INJECTION: Left Knee - Orthovisc #2 of 3 Images were  obtained and interpreted by myself, Teresa Coombs, DO Images have been saved and stored to PACS system. Images obtained on: GE S7 Ultrasound machine  ULTRASOUND FINDINGS: Large effusion, improved syovitis DESCRIPTION OF PROCEDURE: The patient's clinical condition is marked by substantial pain and/or significant functional disability. Other conservative therapy has not provided relief, is contraindicated, or not appropriate. There is a reasonable likelihood that injection will significantly improve the patient's pain and/or functional impairment. After discussing the risks, benefits and expected outcomes of the injection and all questions were reviewed and answered, the patient wished to undergo the above named procedure. Verbal consent was obtained. The ultrasound was used to identify the target structure and adjacent neurovascular structures. The skin was then prepped in sterile fashion and the target structure was injected under direct visualization using sterile technique as below: PREP: Alcohol, Ethel Chloride, 3cc 1% lidocaine on 25 needle APPROACH: Superiolateral, stopcock technique, 18g 1.5" needle INJECTATE: 2cc Orthovisc ASPIRATE: 39cc clear serous straw colored fluit DRESSING: Band-Aid & Pt's Body helix Compression Sleeve Post procedural instructions including recommending icing and warning signs for infection were reviewed. This procedure was well tolerated and there were no complications.  IMPRESSION: Succesful US Guided Aspiration & injection   [place] Limited Joint Space Structures Low Left(no Linked Charges)  Result Date: 05/13/2017 Gerda Diss, DO     05/13/2017  2:29 PM PROCEDURE NOTE - ULTRASOUND GUIDED ASPIRATION & INJECTION:  Left Knee - OrthoVisc #1/3 Images were obtained and interpreted by myself, Teresa Coombs, DO Images have been saved and stored to PACS system. Images obtained on: GE S7 Ultrasound machine  ULTRASOUND FINDINGS: Large effusion, moderate synovitis DESCRIPTION OF  PROCEDURE: The patient's clinical condition is marked by substantial pain and/or significant functional disability. Other conservative therapy has not provided relief, is contraindicated, or not appropriate. There is a reasonable likelihood that injection will significantly improve the patient's pain and/or functional impairment. After discussing the risks, benefits and expected outcomes of the injection and all questions were reviewed and answered, the patient wished to undergo the above named procedure. Verbal consent was obtained. The ultrasound was used to identify the target structure and adjacent neurovascular structures. The skin was then prepped in sterile fashion and the target structure was injected under direct visualization using sterile technique as below: PREP: Alcohol, Ethel Chloride, 3cc 1% lidocaine on 25 needle APPROACH: Superiolateral, stopcock technique, 18g 1.5" needle INJECTATE: 2cc OrthoVisc ASPIRATE:  45cc straw colored serous fluid DRESSING: Band-Aid and 6" Ace-Wrap Post procedural instructions including recommending icing and warning signs for infection were reviewed. This procedure was well tolerated and there were no complications.  IMPRESSION: Succesful US Guided Aspiration & injection   ASSESSMENT & PLAN:     ICD-10-CM   1. Primary osteoarthritis of left knee M17.12 US  GUIDED NEEDLE PLACEMENT(NO LINKED CHARGES)  ================================================================= No problem-specific Assessment & Plan notes found for this encounter. =================================================================   Follow-up: Return in about 1 week (around 05/28/2017) for Orthovisc #3.   CMA/ATC served as Education administrator during this visit. History, Physical, and Plan performed by medical provider. Documentation and orders reviewed and attested to.      Teresa Coombs, [facility] Physician

## 2017-05-21 NOTE — Procedures (Signed)
PROCEDURE NOTE - ULTRASOUND GUIDED ASPIRATION & INJECTION: Left Knee - Orthovisc #2 of 3 Images were obtained and interpreted by myself, Teresa Coombs, DO  Images have been saved and stored to PACS system. Images obtained on: GE S7 Ultrasound machine  ULTRASOUND FINDINGS:  Large effusion, improved syovitis  DESCRIPTION OF PROCEDURE:  The patient's clinical condition is marked by substantial pain and/or significant functional disability. Other conservative therapy has not provided relief, is contraindicated, or not appropriate. There is a reasonable likelihood that injection will significantly improve the patient's pain and/or functional impairment. After discussing the risks, benefits and expected outcomes of the injection and all questions were reviewed and answered, the patient wished to undergo the above named procedure. Verbal consent was obtained. The ultrasound was used to identify the target structure and adjacent neurovascular structures. The skin was then prepped in sterile fashion and the target structure was injected under direct visualization using sterile technique as below: PREP: Alcohol, Ethel Chloride, 3cc 1% lidocaine on 25 needle APPROACH: Superiolateral, stopcock technique, 18g 1.5" needle INJECTATE: 2cc Orthovisc ASPIRATE: 39cc clear serous straw colored fluit  DRESSING: Band-Aid & Pt's Body helix Compression Sleeve  Post procedural instructions including recommending icing and warning signs for infection were reviewed. This procedure was well tolerated and there were no complications.   IMPRESSION: Succesful US Guided Aspiration & injection

## 2017-05-21 NOTE — Patient Instructions (Signed)

## 2017-05-29 ENCOUNTER — Ambulatory Visit: Payer: Self-pay

## 2017-05-29 ENCOUNTER — Encounter: Payer: Self-pay | Admitting: Sports Medicine

## 2017-05-29 ENCOUNTER — Ambulatory Visit (INDEPENDENT_AMBULATORY_CARE_PROVIDER_SITE_OTHER): Payer: Medicare HMO | Admitting: Sports Medicine

## 2017-05-29 VITALS — BP 138/62 | HR 71 | Ht 71.0 in | Wt 235.8 lb

## 2017-05-29 DIAGNOSIS — M1712 Unilateral primary osteoarthritis, left knee: Secondary | ICD-10-CM | POA: Diagnosis not present

## 2017-05-29 NOTE — Procedures (Signed)
PROCEDURE NOTE - ULTRASOUND GUIDED ASPIRATION & INJECTION: LEFT KNEE ORTHOVISC #3/3 Images were obtained and interpreted by myself, Teresa Coombs, DO  Images have been saved and stored to PACS system. Images obtained on: GE S7 Ultrasound machine  ULTRASOUND FINDINGS: Large effusion  DESCRIPTION OF PROCEDURE:  The patient's clinical condition is marked by substantial pain and/or significant functional disability. Other conservative therapy has not provided relief, is contraindicated, or not appropriate. There is a reasonable likelihood that injection will significantly improve the patient's pain and/or functional impairment. After discussing the risks, benefits and expected outcomes of the injection and all questions were reviewed and answered, the patient wished to undergo the above named procedure. Verbal consent was obtained. The ultrasound was used to identify the target structure and adjacent neurovascular structures. The skin was then prepped in sterile fashion and the target structure was injected under direct visualization using sterile technique as below: PREP: Alcohol, Ethel Chloride, 3cc 1% lidocaine on 25 needle APPROACH: Superiolateral, stopcock technique, 18g 1.5" needle INJECTATE: 2cc Orthovisc ASPIRATE: 30cc serous fluid  DRESSING: Band-Aid & Body Helix  Post procedural instructions including recommending icing and warning signs for infection were reviewed. This procedure was well tolerated and there were no complications.   IMPRESSION: Succesful US Guided Aspiration & injection

## 2017-05-29 NOTE — Progress Notes (Signed)
OFFICE VISIT NOTE Lawrence Parker, [facility] [facility] [place]  Lawrence Parker - 71 y.o. male MRN [id number]  Date of birth: 1945/10/21  Visit Date: 05/29/2017  PCP: Lawrence Koch, NP   Referred by: Lawrence Koch, NP  Lawrence Parker, CMA acting as scribe for Dr. Paulla Parker.  SUBJECTIVE:   Chief Complaint  Patient presents with  . Follow-up    osteoarthritis LT knee   HPI: As below and per problem based documentation when appropriate.  [name] is an established patient presenting today in follow-up of osteoarthritis of the LT knee. He has noticed swelling in the LT ankle and knee. He has been wearing his compression sleeve. He has noticed some improvement with knee pain and swelling since his last injection.     Review of Systems  Constitutional: Negative for chills and fever.  Respiratory: Negative for shortness of breath and wheezing.   Cardiovascular: Positive for leg swelling. Negative for chest pain and palpitations.  Musculoskeletal: Positive for joint pain. Negative for falls.  Neurological: Negative for dizziness, tingling and headaches.  Endo/Heme/Allergies: Does not bruise/bleed easily.    Otherwise per HPI.  HISTORY & PERTINENT PRIOR DATA:  [employer] 20% coinsurance, deductible does not apply$45.00 copay if office visit is billed. 03/25/17 BLS He reports that he has never smoked. He has never used smokeless tobacco.   Recent Labs  10/16/16 0919 02/05/17 1030 05/16/17 0832  HGBA1C 7.8* 7.6* 7.3*   Medications & Allergies reviewed per EMR Patient Active Problem List   Diagnosis Date Noted  . Medicare annual wellness visit, subsequent 04/15/2017  . PAD (peripheral artery disease) ([place]) 03/20/2017  . Bilateral lower extremity edema 03/20/2017  . Primary osteoarthritis of left knee 02/06/2017  . Paresthesia of both hands 02/05/2017  . Type 2 diabetes mellitus ([place]) 04/12/2016  . Essential  hypertension 04/12/2016  . Hyperlipidemia 04/12/2016  . Arthritis 04/12/2016   Past Medical History:  Diagnosis Date  . Anxiety    takes Xanax prn  . Arthritis    neck  . Back pain    deteriorating disc  . Bladder neck obstruction   . Colon polyps   . Depression    hx of but doesn't take any medications  . Diabetes mellitus    takes Actos,Glipizide,Metformin,and Lantus  . Diverticula of colon   . History of kidney stones    also has one at present time   . Hyperlipidemia    takes Zocor daily  . Hypertension    takes Amlodipine daily and HCTZ daily  . Neck pain    cervical spondylosis  . Peyronie disease    Family History  Problem Relation Age of Onset  . Anesthesia problems Neg Hx   . Hypotension Neg Hx   . Malignant hyperthermia Neg Hx   . Pseudochol deficiency Neg Hx    Past Surgical History:  Procedure Laterality Date  . CERVICAL SPINE SURGERY    . CIRCUMCISION  20+yrs ago  . COLONOSCOPY    . NOSE SURGERY  20+yrs ago  . SHOULDER SURGERY     right 20+yrs ago   Social History   Occupational History  . Not on file.   Social History Main Topics  . Smoking status: Never Smoker  . Smokeless tobacco: Never Used     Comment: quit smoking 83yrs ago  . Alcohol use No  . Drug use: No  . Sexual activity: Yes    OBJECTIVE:  VS:  HT:5\' 11"  (180.3 cm)   WT:235 lb 12.8 oz (107 kg)  BMI:32.9    BP:138/62  HR:71bpm  TEMP: ( )  RESP:95 % EXAM: Findings:  Generalized osteophytic bossing.  Improved knee effusion that is non-tense.  Good flexion and extension.  Skin surrounding skin changes.     ASSESSMENT & PLAN:     ICD-10-CM   1. Primary osteoarthritis of left knee M17.12 US GUIDED NEEDLE PLACEMENT(NO LINKED CHARGES)   =================================================================  PROCEDURE NOTE - ULTRASOUND GUIDED ASPIRATION & INJECTION: LEFT KNEE ORTHOVISC #3/3 Images were obtained and interpreted by myself, Lawrence Coombs, DO  Images have been  saved and stored to PACS system. Images obtained on: GE S7 Ultrasound machine  ULTRASOUND FINDINGS: Large effusion  DESCRIPTION OF PROCEDURE:  The patient's clinical condition is marked by substantial pain and/or significant functional disability. Other conservative therapy has not provided relief, is contraindicated, or not appropriate. There is a reasonable likelihood that injection will significantly improve the patient's pain and/or functional impairment. After discussing the risks, benefits and expected outcomes of the injection and all questions were reviewed and answered, the patient wished to undergo the above named procedure. Verbal consent was obtained. The ultrasound was used to identify the target structure and adjacent neurovascular structures. The skin was then prepped in sterile fashion and the target structure was injected under direct visualization using sterile technique as below: PREP: Alcohol, Ethel Chloride, 3cc 1% lidocaine on 25 needle APPROACH: Superiolateral, stopcock technique, 18g 1.5" needle INJECTATE: 2cc Orthovisc ASPIRATE: 30cc serous fluid  DRESSING: Band-Aid & Body Helix  Post procedural instructions including recommending icing and warning signs for infection were reviewed. This procedure was well tolerated and there were no complications.   IMPRESSION: Succesful US Guided Aspiration & injection  =================================================================  Follow-up: Return in about 8 weeks (around 07/24/2017).   Lawrence Parker served as Education administrator during this visit. History, Physical, and Plan performed by medical provider. Documentation and orders reviewed and attested to.      Lawrence Parker, [facility] Physician

## 2017-05-29 NOTE — Patient Instructions (Signed)

## 2017-05-30 ENCOUNTER — Ambulatory Visit: Payer: Medicare HMO | Admitting: Sports Medicine

## 2017-06-11 ENCOUNTER — Other Ambulatory Visit: Payer: Self-pay | Admitting: Primary Care

## 2017-06-26 ENCOUNTER — Other Ambulatory Visit: Payer: Self-pay | Admitting: Primary Care

## 2017-06-26 MED ORDER — AMLODIPINE BESYLATE 10 MG PO TABS: 10.0000 mg | ORAL_TABLET | Freq: Every day | ORAL | 1 refills | Status: DC

## 2017-06-26 MED ORDER — INSULIN PEN NEEDLE 31G X 8 MM MISC: 2 refills | Status: DC

## 2017-06-30 ENCOUNTER — Other Ambulatory Visit: Payer: Self-pay | Admitting: Primary Care

## 2017-06-30 ENCOUNTER — Telehealth: Payer: Self-pay

## 2017-06-30 DIAGNOSIS — I10 Essential (primary) hypertension: Secondary | ICD-10-CM

## 2017-06-30 NOTE — Telephone Encounter (Signed)
Pt left v/m requesting cb about refills. I left v/m requesting pt to cb.

## 2017-07-02 NOTE — Telephone Encounter (Signed)
Pt wants to know if new rx for amlodipine was [facility]. Advised amlodipine rx sent on 06/26/17. Pt will ck with pharmacy.

## 2017-07-09 ENCOUNTER — Other Ambulatory Visit: Payer: Self-pay | Admitting: Primary Care

## 2017-07-09 DIAGNOSIS — I10 Essential (primary) hypertension: Secondary | ICD-10-CM

## 2017-07-24 ENCOUNTER — Ambulatory Visit: Payer: Medicare HMO | Admitting: Sports Medicine

## 2017-07-31 ENCOUNTER — Other Ambulatory Visit: Payer: Self-pay | Admitting: Primary Care

## 2017-08-26 ENCOUNTER — Other Ambulatory Visit: Payer: Self-pay | Admitting: Primary Care

## 2017-08-27 ENCOUNTER — Other Ambulatory Visit: Payer: Self-pay | Admitting: Primary Care

## 2017-08-27 DIAGNOSIS — J302 Other seasonal allergic rhinitis: Secondary | ICD-10-CM

## 2017-09-10 ENCOUNTER — Other Ambulatory Visit: Payer: Self-pay | Admitting: Primary Care

## 2017-09-10 DIAGNOSIS — M1990 Unspecified osteoarthritis, unspecified site: Secondary | ICD-10-CM

## 2017-10-16 ENCOUNTER — Encounter: Payer: Self-pay | Admitting: Primary Care

## 2017-10-16 ENCOUNTER — Ambulatory Visit (INDEPENDENT_AMBULATORY_CARE_PROVIDER_SITE_OTHER): Payer: Medicare HMO | Admitting: Primary Care

## 2017-10-16 VITALS — BP 136/70 | HR 63 | Temp 98.5°F | Ht 71.0 in | Wt 234.2 lb

## 2017-10-16 DIAGNOSIS — Z794 Long term (current) use of insulin: Secondary | ICD-10-CM

## 2017-10-16 DIAGNOSIS — E118 Type 2 diabetes mellitus with unspecified complications: Secondary | ICD-10-CM

## 2017-10-16 DIAGNOSIS — Z23 Encounter for immunization: Secondary | ICD-10-CM

## 2017-10-16 LAB — HEMOGLOBIN A1C: Hgb A1c MFr Bld: 7.6 % — ABNORMAL HIGH (ref 4.6–6.5)

## 2017-10-16 NOTE — Addendum Note (Signed)
Addended by: Jacqualin Combes on: 10/16/2017 09:42 AM   Modules accepted: Orders

## 2017-10-16 NOTE — Patient Instructions (Addendum)
Stop by the lab prior to leaving today. I will notify you of your results once received.   Start exercising. You should be getting 150 minutes of exercise weekly.  Schedule your eye exam as discussed.  Please schedule your Medicare Wellness Visit with Katha Cabal and a physical with me in 6 months. Make sure you come fasting to your appointment with Katha Cabal. We will discuss your lab results in detail during your physical.  It was a pleasure to see you today!   Diabetes Mellitus and Nutrition When you have diabetes (diabetes mellitus), it is very important to have healthy eating habits because your blood sugar (glucose) levels are greatly affected by what you eat and drink. Eating healthy foods in the appropriate amounts, at about the same times every day, can help you:  Control your blood glucose.  Lower your risk of heart disease.  Improve your blood pressure.  Reach or maintain a healthy weight.  Every person with diabetes is different, and each person has different needs for a meal plan. Your health care provider may recommend that you work with a diet and nutrition specialist (dietitian) to make a meal plan that is best for you. Your meal plan may vary depending on factors such as:  The calories you need.  The medicines you take.  Your weight.  Your blood glucose, blood pressure, and cholesterol levels.  Your activity level.  Other health conditions you have, such as heart or kidney disease.  How do carbohydrates affect me? Carbohydrates affect your blood glucose level more than any other type of food. Eating carbohydrates naturally increases the amount of glucose in your blood. Carbohydrate counting is a method for keeping track of how many carbohydrates you eat. Counting carbohydrates is important to keep your blood glucose at a healthy level, especially if you use insulin or take certain oral diabetes medicines. It is important to know how many carbohydrates you can safely have  in each meal. This is different for every person. Your dietitian can help you calculate how many carbohydrates you should have at each meal and for snack. Foods that contain carbohydrates include:  Bread, cereal, rice, pasta, and crackers.  Potatoes and corn.  Peas, beans, and lentils.  Milk and yogurt.  Fruit and juice.  Desserts, such as cakes, cookies, ice cream, and candy.  How does alcohol affect me? Alcohol can cause a sudden decrease in blood glucose (hypoglycemia), especially if you use insulin or take certain oral diabetes medicines. Hypoglycemia can be a life-threatening condition. Symptoms of hypoglycemia (sleepiness, dizziness, and confusion) are similar to symptoms of having too much alcohol. If your health care provider says that alcohol is safe for you, follow these guidelines:  Limit alcohol intake to no more than 1 drink per day for nonpregnant women and 2 drinks per day for men. One drink equals 12 oz of beer, 5 oz of wine, or 1 oz of hard liquor.  Do not drink on an empty stomach.  Keep yourself hydrated with water, diet soda, or unsweetened iced tea.  Keep in mind that regular soda, juice, and other mixers may contain a lot of sugar and must be counted as carbohydrates.  What are tips for following this plan? Reading food labels  Start by checking the serving size on the label. The amount of calories, carbohydrates, fats, and other nutrients listed on the label are based on one serving of the food. Many foods contain more than one serving per package.  Check the total  grams (g) of carbohydrates in one serving. You can calculate the number of servings of carbohydrates in one serving by dividing the total carbohydrates by 15. For example, if a food has 30 g of total carbohydrates, it would be equal to 2 servings of carbohydrates.  Check the number of grams (g) of saturated and trans fats in one serving. Choose foods that have low or no amount of these  fats.  Check the number of milligrams (mg) of sodium in one serving. Most people should limit total sodium intake to less than 2,300 mg per day.  Always check the nutrition information of foods labeled as "low-fat" or "nonfat". These foods may be higher in added sugar or refined carbohydrates and should be avoided.  Talk to your dietitian to identify your daily goals for nutrients listed on the label. Shopping  Avoid buying canned, premade, or processed foods. These foods tend to be high in fat, sodium, and added sugar.  Shop around the outside edge of the grocery store. This includes fresh fruits and vegetables, bulk grains, fresh meats, and fresh dairy. Cooking  Use low-heat cooking methods, such as baking, instead of high-heat cooking methods like deep frying.  Cook using healthy oils, such as olive, canola, or sunflower oil.  Avoid cooking with butter, cream, or high-fat meats. Meal planning  Eat meals and snacks regularly, preferably at the same times every day. Avoid going long periods of time without eating.  Eat foods high in fiber, such as fresh fruits, vegetables, beans, and whole grains. Talk to your dietitian about how many servings of carbohydrates you can eat at each meal.  Eat 4-6 ounces of lean protein each day, such as lean meat, chicken, fish, eggs, or tofu. 1 ounce is equal to 1 ounce of meat, chicken, or fish, 1 egg, or 1/4 cup of tofu.  Eat some foods each day that contain healthy fats, such as avocado, nuts, seeds, and fish. Lifestyle   Check your blood glucose regularly.  Exercise at least 30 minutes 5 or more days each week, or as told by your health care provider.  Take medicines as told by your health care provider.  Do not use any products that contain nicotine or tobacco, such as cigarettes and e-cigarettes. If you need help quitting, ask your health care provider.  Work with a Social worker or diabetes educator to identify strategies to manage stress  and any emotional and social challenges. What are some questions to ask my health care provider?  Do I need to meet with a diabetes educator?  Do I need to meet with a dietitian?  What number can I call if I have questions?  When are the best times to check my blood glucose? Where to find more information:  American Diabetes Association: diabetes.org/food-and-fitness/food  Academy of Nutrition and Dietetics: PokerClues.dk  [employer] of Diabetes and Digestive and Kidney Diseases (NIH): ContactWire.be Summary  A healthy meal plan will help you control your blood glucose and maintain a healthy lifestyle.  Working with a diet and nutrition specialist (dietitian) can help you make a meal plan that is best for you.  Keep in mind that carbohydrates and alcohol have immediate effects on your blood glucose levels. It is important to count carbohydrates and to use alcohol carefully. This information is not intended to replace advice given to you by your health care provider. Make sure you discuss any questions you have with your health care provider. Document Released: 05/30/2005 Document Revised: 10/07/2016 Document Reviewed:  10/07/2016 Elsevier Interactive Patient Education  [employer].

## 2017-10-16 NOTE — Progress Notes (Signed)
Subjective:    Patient ID: Lawrence Parker, male    DOB: 06-26-1946, 72 y.o.   MRN: [id number]  HPI  Lawrence Parker is a 72 year old male who presents today for follow up type 2 diabetes.  Current medications include: Glipizide 10 mg BID, Lantus 50 units HS, Actos 30 mg, Metformin 500 mg BID.   He is checking his blood glucose one times daily and is getting readings of: AM fasting: 75-low to mid 100's. Highest reading: 120  Last A1C: 7.3 in August 2018 Last Eye Exam: Due, no recent exam. Last Foot Exam: Due in July 2019 Pneumonia Vaccination: Completed both vaccinations ACE/ARB: ACE Statin: Simvastatin  Diet currently consists of:  Breakfast: Biscuit with ham, bacon Lunch: Vegetables Dinner: Vegetables, corn bread Snacks: None Desserts: None Beverages: Coffee, water, un-sweet tea  Exercise: He's not exercising.     Review of Systems  Constitutional: Negative for fatigue.  Eyes: Negative for visual disturbance.  Respiratory: Negative for shortness of breath.   Cardiovascular: Negative for chest pain.  Neurological: Negative for dizziness and numbness.       Past Medical History:  Diagnosis Date  . Anxiety    takes Xanax prn  . Arthritis    neck  . Back pain    deteriorating disc  . Bladder neck obstruction   . Colon polyps   . Depression    hx of but doesn't take any medications  . Diabetes mellitus    takes Actos,Glipizide,Metformin,and Lantus  . Diverticula of colon   . History of kidney stones    also has one at present time   . Hyperlipidemia    takes Zocor daily  . Hypertension    takes Amlodipine daily and HCTZ daily  . Neck pain    cervical spondylosis  . Peyronie disease      Social History   Socioeconomic History  . Marital status: Married    Spouse name: Not on file  . Number of children: Not on file  . Years of education: Not on file  . Highest education level: Not on file  Social Needs  . Financial resource strain: Not on file    . Food insecurity - worry: Not on file  . Food insecurity - inability: Not on file  . Transportation needs - medical: Not on file  . Transportation needs - non-medical: Not on file  Occupational History  . Not on file  Tobacco Use  . Smoking status: Never Smoker  . Smokeless tobacco: Never Used  . Tobacco comment: quit smoking 2yr ago  Substance and Sexual Activity  . Alcohol use: No  . Drug use: No  . Sexual activity: Yes  Other Topics Concern  . Not on file  Social History Narrative   Married.   2 children. 2 grandchildren.   Retired. Drove trucks for 43 years.   Enjoys traveling, spending time with family.    Past Surgical History:  Procedure Laterality Date  . CERVICAL SPINE SURGERY    . CIRCUMCISION  20+yrs ago  . COLONOSCOPY    . NOSE SURGERY  20+yrs ago  . SHOULDER SURGERY     right 20+yrs ago    Family History  Problem Relation Age of Onset  . Anesthesia problems Neg Hx   . Hypotension Neg Hx   . Malignant hyperthermia Neg Hx   . Pseudochol deficiency Neg Hx     No Known Allergies  Current Outpatient Medications on File Prior to Visit  Medication  Sig Dispense Refill  . ACCU-CHEK FASTCLIX LANCETS MISC Check blood sugar twice a day and as directed. Dx E11.9 300 each 1  . Alcohol Swabs (B-D SINGLE USE SWABS REGULAR) PADS Use as needed to check blood sugar. 300 each 5  . amLODipine (NORVASC) 10 MG tablet Take 1 tablet (10 mg total) by mouth daily. 90 tablet 1  . aspirin 325 MG tablet Take 325 mg by mouth daily.    . Blood Glucose Calibration (ACCU-CHEK AVIVA) SOLN Use as instructed. 1 each 1  . Blood Glucose Monitoring Suppl (ACCU-CHEK AVIVA PLUS) w/Device KIT Check blood sugar twice a day and as directed. Dx E11.9 1 kit 0  . fluticasone (FLONASE) 50 MCG/ACT nasal spray USE 2 SPRAYS INTO BOTH NOSTRILS DAILY. 48 g 1  . gabapentin (NEURONTIN) 100 MG capsule TAKE 1 CAPSULE  TWICE DAILY FOR NUMBNESS TO HANDS. 180 capsule 1  . glipiZIDE (GLUCOTROL) 10 MG tablet  Take 1 tablet (10 mg total) by mouth 2 (two) times daily. 180 tablet 3  . glucose blood (ACCU-CHEK AVIVA PLUS) test strip Check blood sugar twice a day and as directed. Dx E11.9 300 each 2  . hydrochlorothiazide (HYDRODIURIL) 25 MG tablet TAKE 1 TABLET BY MOUTH EVERY MORNING FOR BLOOD PRESSURE 90 tablet 1  . Insulin Glargine (LANTUS SOLOSTAR) 100 UNIT/ML Solostar Pen Inject 50 units at bedtime 45 mL 1  . Insulin Pen Needle 31G X 8 MM MISC Use as directed to injection insulin. 300 each 2  . lisinopril (PRINIVIL,ZESTRIL) 10 MG tablet TAKE 1 TABLET EVERY DAY 90 tablet 1  . meloxicam (MOBIC) 7.5 MG tablet TAKE 1 TABLET TWICE DAILY  FOR  ELBOW 180 tablet 1  . metFORMIN (GLUCOPHAGE) 500 MG tablet TAKE 2 TABLETS TWICE DAILY WITH MEALS 360 tablet 1  . pioglitazone (ACTOS) 30 MG tablet TAKE 1 TABLET EVERY DAY 90 tablet 1  . simvastatin (ZOCOR) 20 MG tablet TAKE 1 TABLET BY MOUTH EVERY EVENING WITH SUPPER 90 tablet 1   No current facility-administered medications on file prior to visit.     BP 136/70   Pulse 63   Temp 98.5 F (36.9 C) (Oral)   Ht '5\' 11"'  (1.803 m)   Wt 234 lb 4 oz (106.3 kg)   SpO2 97%   BMI 32.67 kg/m    Objective:   Physical Exam  Constitutional: He appears well-nourished.  Neck: Neck supple.  Cardiovascular: Normal rate and regular rhythm.  Pulmonary/Chest: Effort normal and breath sounds normal.  Skin: Skin is warm and dry.          Assessment & Plan:

## 2017-10-16 NOTE — Assessment & Plan Note (Signed)
Due for repeat A1C today, pending. Foot exam and pneumonia vaccinations UTD. Influenza vaccination provided today. Managed on ACE and statin.  Discussed the importance of a healthy diet and regular exercise in order for weight loss, and to reduce the risk of any potential medical problems.  Reminded patient to schedule his annual eye exam as he's overdue.

## 2017-10-17 ENCOUNTER — Encounter: Payer: Self-pay | Admitting: *Deleted

## 2017-10-29 ENCOUNTER — Other Ambulatory Visit: Payer: Self-pay | Admitting: Primary Care

## 2017-11-24 DIAGNOSIS — H11431 Conjunctival hyperemia, right eye: Secondary | ICD-10-CM | POA: Diagnosis not present

## 2017-11-24 DIAGNOSIS — H11421 Conjunctival edema, right eye: Secondary | ICD-10-CM | POA: Diagnosis not present

## 2017-11-26 ENCOUNTER — Other Ambulatory Visit: Payer: Self-pay | Admitting: Primary Care

## 2017-11-26 DIAGNOSIS — E119 Type 2 diabetes mellitus without complications: Secondary | ICD-10-CM

## 2017-12-09 ENCOUNTER — Other Ambulatory Visit: Payer: Self-pay | Admitting: Primary Care

## 2017-12-15 ENCOUNTER — Other Ambulatory Visit: Payer: Self-pay | Admitting: Primary Care

## 2018-01-12 ENCOUNTER — Other Ambulatory Visit: Payer: Self-pay | Admitting: Primary Care

## 2018-01-12 DIAGNOSIS — J302 Other seasonal allergic rhinitis: Secondary | ICD-10-CM

## 2018-01-20 DIAGNOSIS — H2513 Age-related nuclear cataract, bilateral: Secondary | ICD-10-CM | POA: Diagnosis not present

## 2018-01-20 DIAGNOSIS — E113393 Type 2 diabetes mellitus with moderate nonproliferative diabetic retinopathy without macular edema, bilateral: Secondary | ICD-10-CM | POA: Diagnosis not present

## 2018-01-20 DIAGNOSIS — H3582 Retinal ischemia: Secondary | ICD-10-CM | POA: Diagnosis not present

## 2018-02-03 ENCOUNTER — Other Ambulatory Visit: Payer: Self-pay | Admitting: Primary Care

## 2018-02-03 DIAGNOSIS — M1990 Unspecified osteoarthritis, unspecified site: Secondary | ICD-10-CM

## 2018-02-03 DIAGNOSIS — I10 Essential (primary) hypertension: Secondary | ICD-10-CM

## 2018-02-24 ENCOUNTER — Other Ambulatory Visit: Payer: Self-pay | Admitting: Primary Care

## 2018-02-24 DIAGNOSIS — I10 Essential (primary) hypertension: Secondary | ICD-10-CM

## 2018-04-10 ENCOUNTER — Other Ambulatory Visit: Payer: Self-pay | Admitting: Primary Care

## 2018-04-10 DIAGNOSIS — M1990 Unspecified osteoarthritis, unspecified site: Secondary | ICD-10-CM

## 2018-04-13 ENCOUNTER — Ambulatory Visit: Payer: Self-pay

## 2018-04-13 ENCOUNTER — Encounter: Payer: Self-pay | Admitting: Sports Medicine

## 2018-04-13 ENCOUNTER — Ambulatory Visit: Payer: Medicare HMO | Admitting: Sports Medicine

## 2018-04-13 VITALS — BP 128/60 | HR 60 | Ht 71.0 in | Wt 222.0 lb

## 2018-04-13 DIAGNOSIS — M25471 Effusion, right ankle: Secondary | ICD-10-CM | POA: Insufficient documentation

## 2018-04-13 DIAGNOSIS — M25571 Pain in right ankle and joints of right foot: Secondary | ICD-10-CM | POA: Diagnosis not present

## 2018-04-13 HISTORY — DX: Effusion, right ankle: M25.471

## 2018-04-13 NOTE — Patient Instructions (Addendum)
You had an injection today.  Things to be aware of after injection are listed below: . You may experience no significant improvement or even a slight worsening in your symptoms during the first 24 to 48 hours.  After that we expect your symptoms to improve gradually over the next 2 weeks for the medicine to have its maximal effect.  You should continue to have improvement out to 6 weeks after your injection. . Dr. Paulla Fore recommends icing the site of the injection for 20 minutes  1-2 times the day of your injection . You may shower but no swimming, tub bath or Jacuzzi for 24 hours. . If your bandage falls off this does not need to be replaced.  It is appropriate to remove the bandage after 4 hours. . You may resume light activities as tolerated unless otherwise directed per Dr. Paulla Fore during your visit  POSSIBLE STEROID SIDE EFFECTS:  Side effects from injectable steroids tend to be less than when taken orally however you may experience some of the symptoms listed below.  If experienced these should only last for a short period of time. Change in menstrual flow  Edema (swelling)  Increased appetite Skin flushing (redness)  Skin rash/acne  Thrush (oral) Yeast vaginitis    Increased sweating  Depression Increased blood glucose levels Cramping and leg/calf  Euphoria (feeling happy)  POSSIBLE PROCEDURE SIDE EFFECTS: The side effects of the injection are usually fairly minimal however if you may experience some of the following side effects that are usually self-limited and will is off on their own.  If you are concerned please feel free to call the office with questions:  Increased numbness or tingling  Nausea or vomiting  Swelling or bruising at the injection site   Please call our office if if you experience any of the following symptoms over the next week as these can be signs of infection:   Fever greater than 100.57F  Significant swelling at the injection site  Significant redness or drainage  from the injection site  If after 2 weeks you are continuing to have worsening symptoms please call our office to discuss what the next appropriate actions should be including the potential for a return office visit or other diagnostic testing.   You had an injection today.  Things to be aware of after injection are listed below: . You may experience no significant improvement or even a slight worsening in your symptoms during the first 24 to 48 hours.  After that we expect your symptoms to improve gradually over the next 2 weeks for the medicine to have its maximal effect.  You should continue to have improvement out to 6 weeks after your injection. . Dr. Paulla Fore recommends icing the site of the injection for 20 minutes  1-2 times the day of your injection . You may shower but no swimming, tub bath or Jacuzzi for 24 hours. . If your bandage falls off this does not need to be replaced.  It is appropriate to remove the bandage after 4 hours. . You may resume light activities as tolerated unless otherwise directed per Dr. Paulla Fore during your visit  POSSIBLE STEROID SIDE EFFECTS:  Side effects from injectable steroids tend to be less than when taken orally however you may experience some of the symptoms listed below.  If experienced these should only last for a short period of time. Change in menstrual flow  Edema (swelling)  Increased appetite Skin flushing (redness)  Skin rash/acne  Thrush (oral)  Yeast vaginitis    Increased sweating  Depression Increased blood glucose levels Cramping and leg/calf  Euphoria (feeling happy)  POSSIBLE PROCEDURE SIDE EFFECTS: The side effects of the injection are usually fairly minimal however if you may experience some of the following side effects that are usually self-limited and will is off on their own.  If you are concerned please feel free to call the office with questions:  Increased numbness or tingling  Nausea or vomiting  Swelling or bruising at the  injection site   Please call our office if if you experience any of the following symptoms over the next week as these can be signs of infection:   Fever greater than 100.32F  Significant swelling at the injection site  Significant redness or drainage from the injection site  If after 2 weeks you are continuing to have worsening symptoms please call our office to discuss what the next appropriate actions should be including the potential for a return office visit or other diagnostic testing.

## 2018-04-13 NOTE — Procedures (Signed)
PROCEDURE NOTE:  Ultrasound Guided: Injection: Right ankle Images were obtained and interpreted by myself, Teresa Coombs, DO  Images have been saved and stored to PACS system. Images obtained on: GE S7 Ultrasound machine.   ULTRASOUND FINDINGS:  Small ankle effusion with degenerative osteophytic spurring over the anterior lateral ankle.  DESCRIPTION OF PROCEDURE:  The patient's clinical condition is marked by substantial pain and/or significant functional disability. Other conservative therapy has not provided relief, is contraindicated, or not appropriate. There is a reasonable likelihood that injection will significantly improve the patient's pain and/or functional impairment.   After discussing the risks, benefits and expected outcomes of the injection and all questions were reviewed and answered, the patient wished to undergo the above named procedure.  Verbal consent was obtained.  The ultrasound was used to identify the target structure and adjacent neurovascular structures. The skin was then prepped in sterile fashion and the target structure was injected under direct visualization using sterile technique as below:  Single injection performed as below: PREP: Alcohol and Ethel Chloride APPROACH:anteriolateral direct, single injection, 25g 1.5 in. INJECTATE: 1 cc 0.5% Marcaine and 1 cc 40mg /mL DepoMedrol ASPIRATE: None DRESSING: Band-Aid  Post procedural instructions including recommending icing and warning signs for infection were reviewed.    This procedure was well tolerated and there were no complications.   IMPRESSION: Succesful Ultrasound Guided: Injection

## 2018-04-13 NOTE — Progress Notes (Signed)
  Lawrence Parker. Lawrence Parker, [facility]  [name] - 72 y.o. male MRN [id number]  Date of birth: Jan 27, 1946  Visit Date: 04/13/2018  PCP: Pleas Koch, NP   Referred by: Pleas Koch, NP  Scribe(s) for today's visit: Wendy Poet, LAT, ATC  SUBJECTIVE:  Lawrence Parker is here for Initial Assessment (R foot pain) .    His R foot pain symptoms INITIALLY: Began about a week ago w/ no known MOI Described as ranging in pain from mod-severe, non-radiating.  He notes that the pain increases when his foot swells. Worsened with walking Improved with ice Additional associated symptoms include: R foot swelling; no N/T noted; no mechanical symptoms noted    At this time symptoms show no change compared to onset. He has been icing and taking OTC anti-inflammatories.   REVIEW OF SYSTEMS: Reports night time disturbances. Denies fevers, chills, or night sweats. Denies unexplained weight loss. Denies personal history of cancer. Denies changes in bowel or bladder habits. Denies recent unreported falls. Denies new or worsening dyspnea or wheezing. Reports headaches or dizziness. Some recent dizziness. Denies numbness, tingling or weakness  In the extremities.  Denies dizziness or presyncopal episodes Reports lower extremity edema    HISTORY & PERTINENT PRIOR DATA:  Prior History reviewed and updated per electronic medical record.  Significant/pertinent history, findings, studies include:  reports that he has never smoked. He has never used smokeless tobacco. Recent Labs    05/16/17 0832 10/16/17 0835  HGBA1C 7.3* 7.6*   [employer] 20% coinsurance, deductible does not apply$45.00 copay if office visit is billed. 03/25/17 BLS No problems updated.  OBJECTIVE:  VS:  HT:5\' 11"  (180.3 cm)   WT:222 lb (100.7 kg)  BMI:30.98    BP:128/60  HR:60bpm  TEMP: ( )  RESP:94 %   PHYSICAL EXAM: Constitutional:  WDWN, Non-toxic appearing. Psychiatric: Alert & appropriately interactive.  Not depressed or anxious appearing. Respiratory: No increased work of breathing.  Trachea Midline Eyes: Pupils are equal.  EOM intact without nystagmus.  No scleral icterus  Vascular Exam: warm to touch trace pedal edema  lower extremity neuro exam: unremarkable  MSK Exam: Right foot and ankle reveal the overall high cavus foot.  He has pain with any type of ankle joint motion especially with subtalar motion.  Ankle drawer testing is stable.  It is painful however.  DP PT pulses are intact.   ASSESSMENT & PLAN:   1. Arthralgia of right ankle   2. Ankle effusion, right     PLAN: Ankle injection performed today under ultrasound guidance.  He did have a small effusion.  Improvement can consider body Helix compression sleeve and would recommend further diagnostic evaluation with plain film x-rays at follow-up if persistent ongoing pain.  Follow-up: Return if symptoms worsen or fail to improve.      Please see additional documentation for Objective, Assessment and Plan sections. Pertinent additional documentation may be included in corresponding procedure notes, imaging studies, problem based documentation and patient instructions. Please see these sections of the encounter for additional information regarding this visit.  CMA/ATC served as Education administrator during this visit. History, Physical, and Plan performed by medical provider. Documentation and orders reviewed and attested to.      Gerda Diss, [facility] Physician

## 2018-04-14 ENCOUNTER — Telehealth: Payer: Self-pay | Admitting: Primary Care

## 2018-04-14 DIAGNOSIS — L989 Disorder of the skin and subcutaneous tissue, unspecified: Secondary | ICD-10-CM

## 2018-04-14 NOTE — Telephone Encounter (Signed)
Please notify patient that the referral has been placed and that someone will be in touch soon for an appointment.

## 2018-04-14 NOTE — Telephone Encounter (Signed)
Spoken and notified patient of [name]'s comments. Patient verbalized understanding.  

## 2018-04-14 NOTE — Telephone Encounter (Signed)
Copied from [place] [phone]. Topic: Referral - Request >> Apr 14, 2018 10:53 AM Margot Ables wrote: Reason for CRM: pt requesting referral to dermatology for growth on his R ear. He said it looks like a sore. Anda Kraft has seen it before but he thinks it's getting bigger. His wife states just smaller than a dime. He states a long time ago he went to Dr. Sherrye Payor on [facility] but isn't sure he is practicing anymore.  Please advise.

## 2018-04-15 ENCOUNTER — Ambulatory Visit: Payer: Medicare HMO

## 2018-04-20 ENCOUNTER — Ambulatory Visit (INDEPENDENT_AMBULATORY_CARE_PROVIDER_SITE_OTHER): Payer: Medicare HMO

## 2018-04-20 VITALS — BP 136/60 | HR 57 | Temp 97.3°F | Ht 71.0 in | Wt 219.2 lb

## 2018-04-20 DIAGNOSIS — Z0000 Encounter for general adult medical examination without abnormal findings: Secondary | ICD-10-CM | POA: Diagnosis not present

## 2018-04-20 DIAGNOSIS — Z23 Encounter for immunization: Secondary | ICD-10-CM | POA: Diagnosis not present

## 2018-04-20 DIAGNOSIS — E1169 Type 2 diabetes mellitus with other specified complication: Secondary | ICD-10-CM

## 2018-04-20 DIAGNOSIS — I10 Essential (primary) hypertension: Secondary | ICD-10-CM

## 2018-04-20 DIAGNOSIS — E785 Hyperlipidemia, unspecified: Secondary | ICD-10-CM | POA: Diagnosis not present

## 2018-04-20 LAB — COMPREHENSIVE METABOLIC PANEL
ALT: 13 U/L (ref 0–53)
AST: 13 U/L (ref 0–37)
Albumin: 4.1 g/dL (ref 3.5–5.2)
Alkaline Phosphatase: 41 U/L (ref 39–117)
BUN: 23 mg/dL (ref 6–23)
CHLORIDE: 103 meq/L (ref 96–112)
CO2: 28 meq/L (ref 19–32)
CREATININE: 0.99 mg/dL (ref 0.40–1.50)
Calcium: 9.5 mg/dL (ref 8.4–10.5)
GFR: 79.14 mL/min (ref >60.00)
Glucose, Bld: 165 mg/dL — ABNORMAL HIGH (ref 70–99)
POTASSIUM: 4.7 meq/L (ref 3.5–5.1)
Sodium: 137 mEq/L (ref 135–145)
Total Bilirubin: 0.6 mg/dL (ref 0.2–1.2)
Total Protein: 6.7 g/dL (ref 6.0–8.3)

## 2018-04-20 LAB — LIPID PANEL
CHOL/HDL RATIO: 4
Cholesterol: 160 mg/dL (ref 0–200)
HDL: 40.7 mg/dL (ref >39.00)
LDL CALC: 85 mg/dL (ref 0–99)
NonHDL: 119.62
TRIGLYCERIDES: 175 mg/dL — AB (ref 0.0–149.0)
VLDL: 35 mg/dL (ref 0.0–40.0)

## 2018-04-20 LAB — CBC WITH DIFFERENTIAL/PLATELET
BASOS ABS: 0.1 10*3/uL (ref 0.0–0.1)
BASOS PCT: 1.8 % (ref 0.0–3.0)
EOS ABS: 0.3 10*3/uL (ref 0.0–0.7)
Eosinophils Relative: 4.5 % (ref 0.0–5.0)
HCT: 36 % — ABNORMAL LOW (ref 39.0–52.0)
Hemoglobin: 12.3 g/dL — ABNORMAL LOW (ref 13.0–17.0)
LYMPHS ABS: 1.8 10*3/uL (ref 0.7–4.0)
Lymphocytes Relative: 28.4 % (ref 12.0–46.0)
MCHC: 34.1 g/dL (ref 30.0–36.0)
MCV: 86.4 fl (ref 78.0–100.0)
Monocytes Absolute: 0.6 10*3/uL (ref 0.1–1.0)
Monocytes Relative: 9.8 % (ref 3.0–12.0)
NEUTROS ABS: 3.4 10*3/uL (ref 1.4–7.7)
Neutrophils Relative %: 55.5 % (ref 43.0–77.0)
Platelets: 182 10*3/uL (ref 150.0–400.0)
RBC: 4.17 Mil/uL — ABNORMAL LOW (ref 4.22–5.81)
RDW: 13.8 % (ref 11.5–15.5)
WBC: 6.2 10*3/uL (ref 4.0–10.5)

## 2018-04-20 LAB — HEMOGLOBIN A1C: Hgb A1c MFr Bld: 9 % — ABNORMAL HIGH (ref 4.6–6.5)

## 2018-04-20 NOTE — Patient Instructions (Addendum)
[name] , Thank you for taking time to come for your Medicare Wellness Visit. I appreciate your ongoing commitment to your health goals. Please review the following plan we discussed and let me know if I can assist you in the future.   These are the goals we discussed: Goals    . Patient Stated     Starting 04/20/2018, I will continue to take medications as prescribed.        This is a list of the screening recommended for you and due dates:  Health Maintenance  Topic Date Due  . Complete foot exam   06/20/2018*  . Flu Shot  12/15/2018*  . Tetanus Vaccine  04/21/2019*  . Hemoglobin A1C  10/21/2018  . Eye exam for diabetics  01/15/2019  . Colon Cancer Screening  09/16/2021  .  Hepatitis C: One time screening is recommended by Center for Disease Control  (CDC) for  adults born from [birth year] through 1964.   Completed  . Pneumonia vaccines  Completed  *Topic was postponed. The date shown is not the original due date.   Preventive Care for Adults  A healthy lifestyle and preventive care can promote health and wellness. Preventive health guidelines for adults include the following key practices.  . A routine yearly physical is a good way to check with your health care provider about your health and preventive screening. It is a chance to share any concerns and updates on your health and to receive a thorough exam.  . Visit your dentist for a routine exam and preventive care every 6 months. Brush your teeth twice a day and floss once a day. Good oral hygiene prevents tooth decay and gum disease.  . The frequency of eye exams is based on your age, health, family medical history, use  of contact lenses, and other factors. Follow your health care provider's recommendations for frequency of eye exams.  . Eat a healthy diet. Foods like vegetables, fruits, whole grains, low-fat dairy products, and lean protein foods contain the nutrients you need without too many calories. Decrease your intake of  foods high in solid fats, added sugars, and salt. Eat the right amount of calories for you. Get information about a proper diet from your health care provider, if necessary.  . Regular physical exercise is one of the most important things you can do for your health. Most adults should get at least 150 minutes of moderate-intensity exercise (any activity that increases your heart rate and causes you to sweat) each week. In addition, most adults need muscle-strengthening exercises on 2 or more days a week.  Silver Sneakers may be a benefit available to you. To determine eligibility, you may visit the website: www.silversneakers.com or contact program at 8578622879 Mon-Fri between 8AM-8PM.   . Maintain a healthy weight. The body mass index (BMI) is a screening tool to identify possible weight problems. It provides an estimate of body fat based on height and weight. Your health care provider can find your BMI and can help you achieve or maintain a healthy weight.   For adults 20 years and older: ? A BMI below 18.5 is considered underweight. ? A BMI of 18.5 to 24.9 is normal. ? A BMI of 25 to 29.9 is considered overweight. ? A BMI of 30 and above is considered obese.   . Maintain normal blood lipids and cholesterol levels by exercising and minimizing your intake of saturated fat. Eat a balanced diet with plenty of fruit and vegetables. Blood  tests for lipids and cholesterol should begin at age 72 and be repeated every 5 years. If your lipid or cholesterol levels are high, you are over 50, or you are at high risk for heart disease, you may need your cholesterol levels checked more frequently. Ongoing high lipid and cholesterol levels should be treated with medicines if diet and exercise are not working.  . If you smoke, find out from your health care provider how to quit. If you do not use tobacco, please do not start.  . If you choose to drink alcohol, please do not consume more than 2 drinks per  day. One drink is considered to be 12 ounces (355 mL) of beer, 5 ounces (148 mL) of wine, or 1.5 ounces (44 mL) of liquor.  . If you are 57-23 years old, ask your health care provider if you should take aspirin to prevent strokes.  . Use sunscreen. Apply sunscreen liberally and repeatedly throughout the day. You should seek shade when your shadow is shorter than you. Protect yourself by wearing long sleeves, pants, a wide-brimmed hat, and sunglasses year round, whenever you are outdoors.  . Once a month, do a whole body skin exam, using a mirror to look at the skin on your back. Tell your health care provider of new moles, moles that have irregular borders, moles that are larger than a pencil eraser, or moles that have changed in shape or color.

## 2018-04-20 NOTE — Progress Notes (Signed)
Subjective:   Lawrence Parker is a 72 y.o. male who presents for Medicare Annual (Subsequent) preventive examination.  Review of Systems:  N/A Cardiac Risk Factors include: advanced age (>58mn, >>55women);male gender;diabetes mellitus;dyslipidemia;obesity (BMI >30kg/m2);hypertension     Objective:     Vitals: BP 136/60 (BP Location: Right Arm, Patient Position: Sitting, Cuff Size: Normal)   Pulse (!) 57   Temp (!) 97.3 F (36.3 C) (Oral)   Ht '5\' 11"'$  (1.803 m) Comment: no shoes  Wt 219 lb 4 oz (99.5 kg)   SpO2 96%   BMI 30.58 kg/m   Body mass index is 30.58 kg/m.  Advanced Directives 04/20/2018 11/15/2011 11/08/2011  Does Patient Have a Medical Advance Directive? Yes Patient does not have advance directive Patient does not have advance directive;Patient would like information  Type of Advance Directive [place] will - -  Copy of [place] Chart? No - copy requested - -  Would patient like information on creating a medical advance directive? - - Advance directive packet given  Pre-existing out of facility DNR order (yellow form or pink MOST form) - No No    Tobacco Social History   Tobacco Use  Smoking Status Never Smoker  Smokeless Tobacco Never Used  Tobacco Comment   quit smoking 213yrago     Counseling given: No Comment: quit smoking [id number]   Clinical Intake:  Pre-visit preparation completed: Yes  Pain Score: 7      Nutritional Status: BMI > 30  Obese Nutritional Risks: None Diabetes: Yes CBG done?: No Did pt. bring in CBG monitor from home?: No  How often do you need to have someone help you when you read instructions, pamphlets, or other written materials from your doctor or pharmacy?: 1 - Never What is the last grade level you completed in school?: 9th grade  Interpreter Needed?: No  Comments: pt lives with spouse Information entered by :: LPinson, LPN  Past Medical History:  Diagnosis Date    . Anxiety    takes Xanax prn  . Arthritis    neck  . Back pain    deteriorating disc  . Bladder neck obstruction   . Colon polyps   . Depression    hx of but doesn't take any medications  . Diabetes mellitus    takes Actos,Glipizide,Metformin,and Lantus  . Diverticula of colon   . History of kidney stones    also has one at present time   . Hyperlipidemia    takes Zocor daily  . Hypertension    takes Amlodipine daily and HCTZ daily  . Neck pain    cervical spondylosis  . Peyronie disease    Past Surgical History:  Procedure Laterality Date  . CERVICAL SPINE SURGERY    . CIRCUMCISION  20+yrs ago  . COLONOSCOPY    . NOSE SURGERY  20+yrs ago  . SHOULDER SURGERY     right 20+yrs ago   Family History  Problem Relation Age of Onset  . Anesthesia problems Neg Hx   . Hypotension Neg Hx   . Malignant hyperthermia Neg Hx   . Pseudochol deficiency Neg Hx    Social History   Socioeconomic History  . Marital status: Married    Spouse name: Not on file  . Number of children: Not on file  . Years of education: Not on file  . Highest education level: Not on file  Occupational History  . Not on file  Social Needs  . Financial resource strain: Not on file  . Food insecurity:    Worry: Not on file    Inability: Not on file  . Transportation needs:    Medical: Not on file    Non-medical: Not on file  Tobacco Use  . Smoking status: Never Smoker  . Smokeless tobacco: Never Used  . Tobacco comment: quit smoking 8yr ago  Substance and Sexual Activity  . Alcohol use: No  . Drug use: No  . Sexual activity: Yes  Lifestyle  . Physical activity:    Days per week: Not on file    Minutes per session: Not on file  . Stress: Not on file  Relationships  . Social connections:    Talks on phone: Not on file    Gets together: Not on file    Attends religious service: Not on file    Active member of club or organization: Not on file    Attends meetings of clubs or  organizations: Not on file    Relationship status: Not on file  Other Topics Concern  . Not on file  Social History Narrative   Married.   2 children. 2 grandchildren.   Retired. Drove trucks for 43 years.   Enjoys traveling, spending time with family.    Outpatient Encounter Medications as of 04/20/2018  Medication Sig  . ACCU-CHEK FASTCLIX LANCETS MISC Check blood sugar twice a day and as directed. Dx E11.9  . Alcohol Swabs (B-D SINGLE USE SWABS REGULAR) PADS Use as needed to check blood sugar.  .Marland KitchenamLODipine (NORVASC) 10 MG tablet TAKE 1 TABLET EVERY DAY  . aspirin 325 MG tablet Take 325 mg by mouth daily.  . Blood Glucose Calibration (ACCU-CHEK AVIVA) SOLN Use as instructed.  . Blood Glucose Monitoring Suppl (ACCU-CHEK AVIVA PLUS) w/Device KIT Check blood sugar twice a day and as directed. Dx E11.9  . fluticasone (FLONASE) 50 MCG/ACT nasal spray USE 2 SPRAYS INTO BOTH NOSTRILS DAILY.  .Marland Kitchengabapentin (NEURONTIN) 100 MG capsule TAKE 1 CAPSULE  TWICE DAILY FOR NUMBNESS TO HANDS.  .Marland KitchenglipiZIDE (GLUCOTROL) 10 MG tablet TAKE 1 TABLET (10 MG TOTAL) BY MOUTH 2 (TWO) TIMES DAILY.  .Marland Kitchenglucose blood (ACCU-CHEK AVIVA PLUS) test strip Check blood sugar twice a day and as directed. Dx E11.9  . hydrochlorothiazide (HYDRODIURIL) 25 MG tablet TAKE 1 TABLET BY MOUTH EVERY MORNING FOR BLOOD PRESSURE  . Insulin Glargine (LANTUS SOLOSTAR) 100 UNIT/ML Solostar Pen Inject 50 units at bedtime  . Insulin Pen Needle 31G X 8 MM MISC Use as directed to injection insulin.  .Marland Kitchenlisinopril (PRINIVIL,ZESTRIL) 10 MG tablet TAKE 1 TABLET EVERY DAY  . meloxicam (MOBIC) 7.5 MG tablet TAKE 1 TABLET TWICE DAILY  FOR  ELBOW  . metFORMIN (GLUCOPHAGE) 500 MG tablet TAKE 2 TABLETS TWICE DAILY WITH MEALS  . pioglitazone (ACTOS) 30 MG tablet TAKE 1 TABLET EVERY DAY  . simvastatin (ZOCOR) 20 MG tablet TAKE 1 TABLET BY MOUTH EVERY EVENING WITH SUPPER   No facility-administered encounter medications on file as of 04/20/2018.      Activities of Daily Living In your present state of health, do you have any difficulty performing the following activities: 04/20/2018  Hearing? N  Vision? Y  Difficulty concentrating or making decisions? N  Walking or climbing stairs? Y  Dressing or bathing? N  Doing errands, shopping? N  Preparing Food and eating ? N  Using the Toilet? N  In the past six months, have you accidently  leaked urine? N  Do you have problems with loss of bowel control? N  Managing your Medications? N  Managing your Finances? N  Housekeeping or managing your Housekeeping? N  Some recent data might be hidden    Patient Care Team: Pleas Koch, NP as PCP - General (Internal Medicine)    Assessment:   This is a routine wellness examination for Lawrence Parker.    Hearing Screening   '125Hz'$  '250Hz'$  '500Hz'$  '1000Hz'$  '2000Hz'$  '3000Hz'$  '4000Hz'$  '6000Hz'$  '8000Hz'$   Right ear:   40 40 40  40    Left ear:   40 40 0  0    Vision Screening Comments: Vision exam in May 2019 @ [facility]    Exercise Activities and Dietary recommendations Current Exercise Habits: The patient does not participate in regular exercise at present, Exercise limited by: None identified  Goals    . Patient Stated     Starting 04/20/2018, I will continue to take medications as prescribed.        Fall Risk Fall Risk  04/20/2018 04/15/2017  Falls in the past year? No No  Depression Screen PHQ 2/9 Scores 04/20/2018 04/15/2017  PHQ - 2 Score 0 0  PHQ- 9 Score 0 -     Cognitive Function MMSE - Mini Mental State Exam 04/20/2018  Orientation to time 5  Orientation to Place 5  Registration 3  Attention/ Calculation 0  Recall 3  Language- name 2 objects 0  Language- repeat 1  Language- follow 3 step command 3  Language- read & follow direction 0  Write a sentence 0  Copy design 0  Total score 20     PLEASE NOTE: A Mini-Cog screen was completed. Maximum score is 20. A value of 0 denotes this part of Folstein MMSE was not completed or the  patient failed this part of the Mini-Cog screening.   Mini-Cog Screening Orientation to Time - Max 5 pts Orientation to Place - Max 5 pts Registration - Max 3 pts Recall - Max 3 pts Language Repeat - Max 1 pts Language Follow 3 Step Command - Max 3 pts     Immunization History  Administered Date(s) Administered  . Influenza,inj,Quad PF,6+ Mos 07/15/2016, 10/16/2017  . Pneumococcal Conjugate-13 07/15/2016  . Pneumococcal Polysaccharide-23 11/16/2011, 04/20/2018   Screening Tests Health Maintenance  Topic Date Due  . FOOT EXAM  06/20/2018 (Originally 04/15/2018)  . INFLUENZA VACCINE  12/15/2018 (Originally 04/16/2018)  . TETANUS/TDAP  04/21/2019 (Originally [birth year]/1966)  . HEMOGLOBIN A1C  10/21/2018  . OPHTHALMOLOGY EXAM  01/15/2019  . COLONOSCOPY  09/16/2021  . Hepatitis C Screening  Completed  . PNA vac Low Risk Adult  Completed       Plan:     I have personally reviewed, addressed, and noted the following in the patient's chart:  A. Medical and social history B. Use of alcohol, tobacco or illicit drugs  C. Current medications and supplements D. Functional ability and status E.  Nutritional status F.  Physical activity G. Advance directives H. List of other physicians I.  Hospitalizations, surgeries, and ER visits in previous 12 months J.  [place] to include hearing, vision, cognitive, depression L. Referrals and appointments - none  In addition, I have reviewed and discussed with patient certain preventive protocols, quality metrics, and best practice recommendations. A written personalized care plan for preventive services as well as general preventive health recommendations were provided to patient.  See attached scanned questionnaire for additional information.   Signed,  Lindell Noe, MHA, BS, LPN Health Coach

## 2018-04-20 NOTE — Progress Notes (Signed)
PCP notes:   Health maintenance:  Foot exam - PCP follow-up needed Flu vaccine - addressed Tetanus vaccine - postponed/insurance A1C - completed PPSV23 - administered  Abnormal screenings:   Hearing - failed  Hearing Screening   125Hz  250Hz  500Hz  1000Hz  2000Hz  3000Hz  4000Hz  6000Hz  8000Hz   Right ear:   40 40 40  40    Left ear:   40 40 0  0     Patient concerns:   None  Nurse concerns:  None  Next PCP appt:   04/22/18 @ 0820

## 2018-04-22 ENCOUNTER — Encounter: Payer: Medicare HMO | Admitting: Primary Care

## 2018-04-22 ENCOUNTER — Encounter: Payer: Self-pay | Admitting: Primary Care

## 2018-04-22 ENCOUNTER — Ambulatory Visit (INDEPENDENT_AMBULATORY_CARE_PROVIDER_SITE_OTHER): Payer: Medicare HMO | Admitting: Primary Care

## 2018-04-22 VITALS — BP 138/62 | HR 61 | Temp 98.0°F | Ht 71.0 in | Wt 220.2 lb

## 2018-04-22 DIAGNOSIS — M25471 Effusion, right ankle: Secondary | ICD-10-CM

## 2018-04-22 DIAGNOSIS — Z0000 Encounter for general adult medical examination without abnormal findings: Secondary | ICD-10-CM | POA: Insufficient documentation

## 2018-04-22 DIAGNOSIS — Z794 Long term (current) use of insulin: Secondary | ICD-10-CM | POA: Diagnosis not present

## 2018-04-22 DIAGNOSIS — E785 Hyperlipidemia, unspecified: Secondary | ICD-10-CM

## 2018-04-22 DIAGNOSIS — M1990 Unspecified osteoarthritis, unspecified site: Secondary | ICD-10-CM

## 2018-04-22 DIAGNOSIS — E118 Type 2 diabetes mellitus with unspecified complications: Secondary | ICD-10-CM | POA: Diagnosis not present

## 2018-04-22 DIAGNOSIS — I10 Essential (primary) hypertension: Secondary | ICD-10-CM | POA: Diagnosis not present

## 2018-04-22 MED ORDER — INSULIN GLARGINE 100 UNIT/ML SOLOSTAR PEN: PEN_INJECTOR | SUBCUTANEOUS | 1 refills | Status: DC

## 2018-04-22 NOTE — Assessment & Plan Note (Addendum)
Recent A1C with increase to 9.0, discussed with patient. Strongly advised he start exercising. Suspect this is secondary to increased pancreatic insuffiencey given recent weight loss and overall healthy diet.   Will separate Lantus to 25 units BID as he's injecting large quantities in one dose. Continue oral medications for now. Consider adding in meal time coverage if needed, he declines for now.   Continue gabapentin for neuropathic pain.  Follow up in 3 months for re-evaluation.

## 2018-04-22 NOTE — Progress Notes (Signed)
Subjective:    Patient ID: OVIDE DUSEK, male    DOB: 11-14-1945, 72 y.o.   MRN: [id number]  HPI  Mr. Kromer is a 72 year old male who presents today for complete physical. He saw our health advisor last week.   Immunizations: -Influenza: Completed last season -Pneumonia: Completed Prevnar in 2017, Pneumovax in 2019 -Shingles: Completed per patient.   Diet: He endorses a healthy diet Breakfast: Oatmeal Lunch: Salads Dinner: Vegetables Snacks: Fruit Desserts: None Beverages: Water, some diet soda  Exercise: He is not exercising Eye exam: Completed in May 2019 Dental exam: No recent exam Colonoscopy: Completed in 2013 Hep C Screen: Negative in 2019  He is checking his glucose every morning before breakfast: AM Fasting: 100-140  Highest number: 180 Lowest number: 75  He is injecting 51 units of Lantus every morning. He is compliant to Actos, Metformin, Glipizide.  BP Readings from Last 3 Encounters:  04/22/18 138/62  04/20/18 136/60  04/13/18 128/60    He's checking his BP at home infrequently which is running 140/70's. He denies chest pain, dizziness, shortness of breath.   Wt Readings from Last 3 Encounters:  04/22/18 220 lb 4 oz (99.9 kg)  04/20/18 219 lb 4 oz (99.5 kg)  04/13/18 222 lb (100.7 kg)     Review of Systems  Constitutional: Negative for unexpected weight change.  HENT: Negative for rhinorrhea.   Respiratory: Negative for cough and shortness of breath.   Cardiovascular: Negative for chest pain.  Gastrointestinal: Negative for constipation and diarrhea.  Genitourinary: Negative for difficulty urinating.  Musculoskeletal: Positive for arthralgias.  Skin: Negative for rash.  Allergic/Immunologic: Negative for environmental allergies.  Neurological: Negative for dizziness and headaches.  Psychiatric/Behavioral: The patient is not nervous/anxious.        Past Medical History:  Diagnosis Date  . Anxiety    takes Xanax prn  . Arthritis      neck  . Back pain    deteriorating disc  . Bladder neck obstruction   . Colon polyps   . Depression    hx of but doesn't take any medications  . Diabetes mellitus    takes Actos,Glipizide,Metformin,and Lantus  . Diverticula of colon   . History of kidney stones    also has one at present time   . Hyperlipidemia    takes Zocor daily  . Hypertension    takes Amlodipine daily and HCTZ daily  . Neck pain    cervical spondylosis  . Peyronie disease      Social History   Socioeconomic History  . Marital status: Married    Spouse name: Not on file  . Number of children: Not on file  . Years of education: Not on file  . Highest education level: Not on file  Occupational History  . Not on file  Social Needs  . Financial resource strain: Not on file  . Food insecurity:    Worry: Not on file    Inability: Not on file  . Transportation needs:    Medical: Not on file    Non-medical: Not on file  Tobacco Use  . Smoking status: Never Smoker  . Smokeless tobacco: Never Used  . Tobacco comment: quit smoking 53yr ago  Substance and Sexual Activity  . Alcohol use: No  . Drug use: No  . Sexual activity: Yes  Lifestyle  . Physical activity:    Days per week: Not on file    Minutes per session: Not on file  .  Stress: Not on file  Relationships  . Social connections:    Talks on phone: Not on file    Gets together: Not on file    Attends religious service: Not on file    Active member of club or organization: Not on file    Attends meetings of clubs or organizations: Not on file    Relationship status: Not on file  . Intimate partner violence:    Fear of current or ex partner: Not on file    Emotionally abused: Not on file    Physically abused: Not on file    Forced sexual activity: Not on file  Other Topics Concern  . Not on file  Social History Narrative   Married.   2 children. 2 grandchildren.   Retired. Drove trucks for 43 years.   Enjoys traveling, spending  time with family.    Past Surgical History:  Procedure Laterality Date  . CERVICAL SPINE SURGERY    . CIRCUMCISION  20+yrs ago  . COLONOSCOPY    . NOSE SURGERY  20+yrs ago  . SHOULDER SURGERY     right 20+yrs ago    Family History  Problem Relation Age of Onset  . Anesthesia problems Neg Hx   . Hypotension Neg Hx   . Malignant hyperthermia Neg Hx   . Pseudochol deficiency Neg Hx     No Known Allergies  Current Outpatient Medications on File Prior to Visit  Medication Sig Dispense Refill  . ACCU-CHEK FASTCLIX LANCETS MISC Check blood sugar twice a day and as directed. Dx E11.9 300 each 1  . Alcohol Swabs (B-D SINGLE USE SWABS REGULAR) PADS Use as needed to check blood sugar. 300 each 5  . amLODipine (NORVASC) 10 MG tablet TAKE 1 TABLET EVERY DAY 90 tablet 1  . aspirin 325 MG tablet Take 325 mg by mouth daily.    . Blood Glucose Calibration (ACCU-CHEK AVIVA) SOLN Use as instructed. 1 each 1  . Blood Glucose Monitoring Suppl (ACCU-CHEK AVIVA PLUS) w/Device KIT Check blood sugar twice a day and as directed. Dx E11.9 1 kit 0  . fluticasone (FLONASE) 50 MCG/ACT nasal spray USE 2 SPRAYS INTO BOTH NOSTRILS DAILY. 48 g 1  . gabapentin (NEURONTIN) 100 MG capsule TAKE 1 CAPSULE  TWICE DAILY FOR NUMBNESS TO HANDS. 180 capsule 1  . glipiZIDE (GLUCOTROL) 10 MG tablet TAKE 1 TABLET (10 MG TOTAL) BY MOUTH 2 (TWO) TIMES DAILY. 180 tablet 1  . glucose blood (ACCU-CHEK AVIVA PLUS) test strip Check blood sugar twice a day and as directed. Dx E11.9 300 each 2  . hydrochlorothiazide (HYDRODIURIL) 25 MG tablet TAKE 1 TABLET BY MOUTH EVERY MORNING FOR BLOOD PRESSURE 90 tablet 1  . Insulin Pen Needle 31G X 8 MM MISC Use as directed to injection insulin. 300 each 2  . lisinopril (PRINIVIL,ZESTRIL) 10 MG tablet TAKE 1 TABLET EVERY DAY 90 tablet 1  . meloxicam (MOBIC) 7.5 MG tablet TAKE 1 TABLET TWICE DAILY  FOR  ELBOW 180 tablet 0  . metFORMIN (GLUCOPHAGE) 500 MG tablet TAKE 2 TABLETS TWICE DAILY WITH  MEALS 360 tablet 1  . pioglitazone (ACTOS) 30 MG tablet TAKE 1 TABLET EVERY DAY 90 tablet 1  . simvastatin (ZOCOR) 20 MG tablet TAKE 1 TABLET BY MOUTH EVERY EVENING WITH SUPPER 90 tablet 1   No current facility-administered medications on file prior to visit.     BP 138/62   Pulse 61   Temp 98 F (36.7 C) (Oral)  Ht _0  (1.803 m)   Wt 220 lb 4 oz (99.9 kg)   SpO2 98%   BMI 30.72 kg/m    Objective:   Physical Exam  Constitutional: He is oriented to person, place, and time. He appears well-nourished.  HENT:  Mouth/Throat: No oropharyngeal exudate.  Eyes: Pupils are equal, round, and reactive to light. EOM are normal.  Neck: Neck supple. No thyromegaly present.  Cardiovascular: Normal rate and regular rhythm.  Respiratory: Effort normal and breath sounds normal.  GI: Soft. Bowel sounds are normal. There is no tenderness.  Musculoskeletal:  Decrease in ROM to right foot and ankle  Neurological: He is alert and oriented to person, place, and time.  Skin: Skin is warm and dry.  Psychiatric: He has a normal mood and affect.           Assessment & Plan:

## 2018-04-22 NOTE — Progress Notes (Signed)
I reviewed health advisor's note, was available for consultation, and agree with documentation and plan.  

## 2018-04-22 NOTE — Assessment & Plan Note (Signed)
Recent lipid panel stable, continue simvastatin. 

## 2018-04-22 NOTE — Assessment & Plan Note (Signed)
Receiving injections. Overall with decrease in ROM.

## 2018-04-22 NOTE — Assessment & Plan Note (Signed)
Borderline high, discussed with patient. He will start monitoring BP at home more regularly. Would like to see him around 120-130/70-80's. Continue to monitor. Consider increasing lisinopril dose next visit.

## 2018-04-22 NOTE — Assessment & Plan Note (Signed)
Overall stable, working with sports medicine.

## 2018-04-22 NOTE — Patient Instructions (Addendum)
We've changed your insulin. Inject 25 units in the morning and 25 units in the evening. Continue your medications as prescribed.  Start checking your blood sugar at different times of the day: Before breakfast, 2 hours after a meal, bedtime.  Record your readings and bring them to your next visit.  Start exercising. You should be getting 150 minutes of exercise weekly.  Continue to work on [employer].  Ensure you are consuming 64 ounces of water daily.  Schedule a follow up visit with me in 3 months for diabetes check.  It was a pleasure to see you today!   Preventive Care 27 Years and Older, Male Preventive care refers to lifestyle choices and visits with your health care provider that can promote health and wellness. What does preventive care include?  A yearly physical exam. This is also called an annual well check.  Dental exams once or twice a year.  Routine eye exams. Ask your health care provider how often you should have your eyes checked.  Personal lifestyle choices, including: ? Daily care of your teeth and gums. ? Regular physical activity. ? Eating a healthy diet. ? Avoiding tobacco and drug use. ? Limiting alcohol use. ? Practicing safe sex. ? Taking low doses of aspirin every day. ? Taking vitamin and mineral supplements as recommended by your health care provider. What happens during an annual well check? The services and screenings done by your health care provider during your annual well check will depend on your age, overall health, lifestyle risk factors, and family history of disease. Counseling Your health care provider may ask you questions about your:  Alcohol use.  Tobacco use.  Drug use.  Emotional well-being.  Home and relationship well-being.  Sexual activity.  Eating habits.  History of falls.  Memory and ability to understand (cognition).  Work and work Statistician.  Screening You may have the following tests or  measurements:  Height, weight, and BMI.  Blood pressure.  Lipid and cholesterol levels. These may be checked every 5 years, or more frequently if you are over 39 years old.  Skin check.  Lung cancer screening. You may have this screening every year starting at age 41 if you have a 30-pack-year history of smoking and currently smoke or have quit within the past 15 years.  Fecal occult blood test (FOBT) of the stool. You may have this test every year starting at age 40.  Flexible sigmoidoscopy or colonoscopy. You may have a sigmoidoscopy every 5 years or a colonoscopy every 10 years starting at age 1.  Prostate cancer screening. Recommendations will vary depending on your family history and other risks.  Hepatitis C blood test.  Hepatitis B blood test.  Sexually transmitted disease (STD) testing.  Diabetes screening. This is done by checking your blood sugar (glucose) after you have not eaten for a while (fasting). You may have this done every 1-3 years.  Abdominal aortic aneurysm (AAA) screening. You may need this if you are a current or former smoker.  Osteoporosis. You may be screened starting at age 54 if you are at high risk.  Talk with your health care provider about your test results, treatment options, and if necessary, the need for more tests. Vaccines Your health care provider may recommend certain vaccines, such as:  Influenza vaccine. This is recommended every year.  Tetanus, diphtheria, and acellular pertussis (Tdap, Td) vaccine. You may need a Td booster every 10 years.  Varicella vaccine. You may need this if  you have not been vaccinated.  Zoster vaccine. You may need this after age 13.  Measles, mumps, and rubella (MMR) vaccine. You may need at least one dose of MMR if you were born in 1957 or later. You may also need a second dose.  Pneumococcal 13-valent conjugate (PCV13) vaccine. One dose is recommended after age 53.  Pneumococcal polysaccharide  (PPSV23) vaccine. One dose is recommended after age 11.  Meningococcal vaccine. You may need this if you have certain conditions.  Hepatitis A vaccine. You may need this if you have certain conditions or if you travel or work in places where you may be exposed to hepatitis A.  Hepatitis B vaccine. You may need this if you have certain conditions or if you travel or work in places where you may be exposed to hepatitis B.  Haemophilus influenzae type b (Hib) vaccine. You may need this if you have certain risk factors.  Talk to your health care provider about which screenings and vaccines you need and how often you need them. This information is not intended to replace advice given to you by your health care provider. Make sure you discuss any questions you have with your health care provider. Document Released: 09/29/2015 Document Revised: 05/22/2016 Document Reviewed: 07/04/2015 Elsevier Interactive Patient Education  [employer].

## 2018-04-22 NOTE — Assessment & Plan Note (Signed)
Immunizations UTD. He will check on the cost for Td. PSA due next visit. Colonoscopy UTD. Discussed the importance of a healthy diet and regular exercise in order for weight loss, and to reduce the risk of any potential medical problems. Exam stable. Labs reviewed.  Follow up in 1 year for CPE.

## 2018-04-27 ENCOUNTER — Other Ambulatory Visit: Payer: Self-pay | Admitting: Primary Care

## 2018-04-27 DIAGNOSIS — I10 Essential (primary) hypertension: Secondary | ICD-10-CM

## 2018-05-07 DIAGNOSIS — C44222 Squamous cell carcinoma of skin of right ear and external auricular canal: Secondary | ICD-10-CM | POA: Diagnosis not present

## 2018-05-13 ENCOUNTER — Telehealth: Payer: Self-pay | Admitting: *Deleted

## 2018-05-13 DIAGNOSIS — Z794 Long term (current) use of insulin: Secondary | ICD-10-CM

## 2018-05-13 DIAGNOSIS — E118 Type 2 diabetes mellitus with unspecified complications: Secondary | ICD-10-CM

## 2018-05-13 MED ORDER — INSULIN GLARGINE 100 UNIT/ML SOLOSTAR PEN: PEN_INJECTOR | SUBCUTANEOUS | 1 refills | Status: DC

## 2018-05-13 NOTE — Telephone Encounter (Signed)
Refills sent as requested

## 2018-05-13 NOTE — Telephone Encounter (Signed)
Copied from [place] [id number]. Topic: General - Other >> May 12, 2018 12:16 PM Carolyn Stare wrote:  Pt below RX was sent to [facility] and need to be sent to [facility]   Insulin Glargine (LANTUS SOLOSTAR) 100 UNIT/ML Solostar Pen

## 2018-06-02 ENCOUNTER — Other Ambulatory Visit: Payer: Self-pay | Admitting: Primary Care

## 2018-06-02 DIAGNOSIS — J302 Other seasonal allergic rhinitis: Secondary | ICD-10-CM

## 2018-06-08 DIAGNOSIS — C44222 Squamous cell carcinoma of skin of right ear and external auricular canal: Secondary | ICD-10-CM | POA: Diagnosis not present

## 2018-06-12 ENCOUNTER — Other Ambulatory Visit: Payer: Self-pay | Admitting: Primary Care

## 2018-06-15 ENCOUNTER — Ambulatory Visit (INDEPENDENT_AMBULATORY_CARE_PROVIDER_SITE_OTHER): Payer: Medicare HMO | Admitting: Internal Medicine

## 2018-06-15 ENCOUNTER — Encounter: Payer: Self-pay | Admitting: Internal Medicine

## 2018-06-15 ENCOUNTER — Encounter (INDEPENDENT_AMBULATORY_CARE_PROVIDER_SITE_OTHER): Payer: Self-pay

## 2018-06-15 VITALS — BP 128/70 | HR 66 | Temp 98.3°F | Wt 235.0 lb

## 2018-06-15 DIAGNOSIS — J069 Acute upper respiratory infection, unspecified: Secondary | ICD-10-CM

## 2018-06-15 DIAGNOSIS — B9789 Other viral agents as the cause of diseases classified elsewhere: Secondary | ICD-10-CM | POA: Diagnosis not present

## 2018-06-15 MED ORDER — HYDROCOD POLST-CPM POLST ER 10-8 MG/5ML PO SUER: 5.0000 mL | Freq: Every evening | ORAL | 0 refills | Status: DC | PRN

## 2018-06-15 NOTE — Patient Instructions (Signed)

## 2018-06-15 NOTE — Progress Notes (Signed)
HPI  Pt presents to the clinic today with c/o runny nose, nasal congestion and cough. He reports this started 2-3 weeks ago. He is blowing white mucous out of his nose. The cough is productive of white mucous. He denies fever, chills or body aches. He has tried Tylenol Cold and Flu with minimal relief. He has no history of allergies or breathing problems. He does have DM 2, pneumonia vaccines are UTD. He has not had sick contacts that he is aware of.  Review of Systems      Past Medical History:  Diagnosis Date  . Anxiety    takes Xanax prn  . Arthritis    neck  . Back pain    deteriorating disc  . Bladder neck obstruction   . Colon polyps   . Depression    hx of but doesn't take any medications  . Diabetes mellitus    takes Actos,Glipizide,Metformin,and Lantus  . Diverticula of colon   . History of kidney stones    also has one at present time   . Hyperlipidemia    takes Zocor daily  . Hypertension    takes Amlodipine daily and HCTZ daily  . Neck pain    cervical spondylosis  . Peyronie disease     Family History  Problem Relation Age of Onset  . Anesthesia problems Neg Hx   . Hypotension Neg Hx   . Malignant hyperthermia Neg Hx   . Pseudochol deficiency Neg Hx     Social History   Socioeconomic History  . Marital status: Married    Spouse name: Not on file  . Number of children: Not on file  . Years of education: Not on file  . Highest education level: Not on file  Occupational History  . Not on file  Social Needs  . Financial resource strain: Not on file  . Food insecurity:    Worry: Not on file    Inability: Not on file  . Transportation needs:    Medical: Not on file    Non-medical: Not on file  Tobacco Use  . Smoking status: Never Smoker  . Smokeless tobacco: Never Used  . Tobacco comment: quit smoking 25yrs ago  Substance and Sexual Activity  . Alcohol use: No  . Drug use: No  . Sexual activity: Yes  Lifestyle  . Physical activity:    Days  per week: Not on file    Minutes per session: Not on file  . Stress: Not on file  Relationships  . Social connections:    Talks on phone: Not on file    Gets together: Not on file    Attends religious service: Not on file    Active member of club or organization: Not on file    Attends meetings of clubs or organizations: Not on file    Relationship status: Not on file  . Intimate partner violence:    Fear of current or ex partner: Not on file    Emotionally abused: Not on file    Physically abused: Not on file    Forced sexual activity: Not on file  Other Topics Concern  . Not on file  Social History Narrative   Married.   2 children. 2 grandchildren.   Retired. Drove trucks for 43 years.   Enjoys traveling, spending time with family.    No Known Allergies   Constitutional: Denies headache, fatigue, fever or abrupt weight changes.  HEENT:  Positive runny nose, nasal congestoin. Denies eye  redness, eye pain, pressure behind the eyes, facial pain,  ear pain, ringing in the ears, wax buildup or sore throat. Respiratory: Positive cough. Denies difficulty breathing or shortness of breath.  Cardiovascular: Denies chest pain, chest tightness, palpitations or swelling in the hands or feet.   No other specific complaints in a complete review of systems (except as listed in HPI above).  Objective:   BP 128/70   Pulse 66   Temp 98.3 F (36.8 C) (Oral)   Wt 235 lb (106.6 kg)   SpO2 97%   BMI 32.78 kg/m  Wt Readings from Last 3 Encounters:  06/15/18 235 lb (106.6 kg)  04/22/18 220 lb 4 oz (99.9 kg)  04/20/18 219 lb 4 oz (99.5 kg)     General: Appears his stated age, in NAD. HEENT: Head: normal shape and size, no sinus tenderness; Ears: Tm's gray and intact, normal light reflex; Nose: mucosa boggy and moist, turbinates swollen; Throat/Mouth: + PND. Teeth present, mucosa pink and moist, no exudate noted, no lesions or ulcerations noted.  Neck: No cervical lymphadenopathy.   Cardiovascular: Normal rate and rhythm. S1,S2 noted.   Pulmonary/Chest: Normal effort and positive vesicular breath sounds. No respiratory distress. No wheezes, rales or ronchi noted.       Assessment & Plan:   Viral Upper Respiratory Infection with Cough:  Get some rest and drink plenty of water Start Zyrtec and Flonase OTC eRx for Tussionex cough syrup  RTC as needed or if symptoms persist.   Webb Silversmith, NP

## 2018-06-16 ENCOUNTER — Other Ambulatory Visit: Payer: Self-pay | Admitting: Primary Care

## 2018-06-16 DIAGNOSIS — M1990 Unspecified osteoarthritis, unspecified site: Secondary | ICD-10-CM

## 2018-06-16 NOTE — Telephone Encounter (Signed)
Electronic refill request Last office visit 06/15/18/acute Last refill 04/13/18 #[id number]

## 2018-06-16 NOTE — Telephone Encounter (Signed)
Is he still taking this Meloxicam 7.5 mg? Is he taking twice daily, everyday?

## 2018-06-18 NOTE — Telephone Encounter (Signed)
Spoken and notified patient of Lawrence Parker comments. Patient stated that yes he still taking meloxicam 7.5 BID and everyday. I have asked if he is taking this for his elbow and he stated yes and also helps with arthritis.

## 2018-06-18 NOTE — Telephone Encounter (Signed)
He shouldn't need refill until 11/29

## 2018-06-18 NOTE — Telephone Encounter (Signed)
It looks like it was filled on 07/29, he will run out on 10/29. Refill too soon.

## 2018-07-13 ENCOUNTER — Other Ambulatory Visit: Payer: Self-pay | Admitting: Primary Care

## 2018-07-13 DIAGNOSIS — I10 Essential (primary) hypertension: Secondary | ICD-10-CM

## 2018-07-21 DIAGNOSIS — H2513 Age-related nuclear cataract, bilateral: Secondary | ICD-10-CM | POA: Diagnosis not present

## 2018-07-21 DIAGNOSIS — H3582 Retinal ischemia: Secondary | ICD-10-CM | POA: Diagnosis not present

## 2018-07-21 DIAGNOSIS — E113391 Type 2 diabetes mellitus with moderate nonproliferative diabetic retinopathy without macular edema, right eye: Secondary | ICD-10-CM | POA: Diagnosis not present

## 2018-07-21 DIAGNOSIS — E113312 Type 2 diabetes mellitus with moderate nonproliferative diabetic retinopathy with macular edema, left eye: Secondary | ICD-10-CM | POA: Diagnosis not present

## 2018-07-23 ENCOUNTER — Encounter: Payer: Self-pay | Admitting: Primary Care

## 2018-07-23 ENCOUNTER — Ambulatory Visit (INDEPENDENT_AMBULATORY_CARE_PROVIDER_SITE_OTHER): Payer: Medicare HMO | Admitting: Primary Care

## 2018-07-23 VITALS — BP 128/74 | HR 65 | Temp 98.2°F | Ht 71.0 in | Wt 239.2 lb

## 2018-07-23 DIAGNOSIS — E119 Type 2 diabetes mellitus without complications: Secondary | ICD-10-CM | POA: Diagnosis not present

## 2018-07-23 DIAGNOSIS — Z794 Long term (current) use of insulin: Secondary | ICD-10-CM | POA: Diagnosis not present

## 2018-07-23 DIAGNOSIS — E118 Type 2 diabetes mellitus with unspecified complications: Secondary | ICD-10-CM

## 2018-07-23 LAB — POCT GLYCOSYLATED HEMOGLOBIN (HGB A1C): HEMOGLOBIN A1C: 7.1 % — AB (ref 4.0–5.6)

## 2018-07-23 NOTE — Progress Notes (Signed)
Subjective:    Patient ID: Lawrence Parker, male    DOB: Aug 10, 1946, 72 y.o.   MRN: [id number]  HPI  Lawrence Parker is a 72 year old male who presents today for follow up of diabetes.  Current medications include: Glipizide 10 mg BID, Metformin 1000 mg BID, actos 30 mg, Lantus 25 units BID.   He is checking his blood glucose once daily and is getting readings of: AM fasting: 100  Highest reading: 150 Lowest reading: 60  Last A1C: 9.0 in August 2019, 7.1 today Last Eye Exam: Completed in May 2020 Last Foot Exam: Due Pneumonia Vaccination: ACE/ARB: Lisinopril  Statin: Simvastatin   Diet currently consists of:  Breakfast: Eggs, sausage, oatmeal Lunch: Salad Dinner: Hamburgers, fries, restaurants, beans, corn Snacks: Sandwiches, chips Desserts: Rarely  Beverages: Un-sweet tea, water, diet pepsi  Exercise: He is trying to exercise at the gym.   BP Readings from Last 3 Encounters:  07/23/18 128/74  06/15/18 128/70  04/22/18 138/62      Review of Systems  Eyes:       To be getting cataracts removed  Respiratory: Negative for shortness of breath.   Cardiovascular: Negative for chest pain.  Neurological: Negative for dizziness and numbness.       Past Medical History:  Diagnosis Date  . Anxiety    takes Xanax prn  . Arthritis    neck  . Back pain    deteriorating disc  . Bladder neck obstruction   . Colon polyps   . Depression    hx of but doesn't take any medications  . Diabetes mellitus    takes Actos,Glipizide,Metformin,and Lantus  . Diverticula of colon   . History of kidney stones    also has one at present time   . Hyperlipidemia    takes Zocor daily  . Hypertension    takes Amlodipine daily and HCTZ daily  . Neck pain    cervical spondylosis  . Peyronie disease      Social History   Socioeconomic History  . Marital status: Married    Spouse name: Not on file  . Number of children: Not on file  . Years of education: Not on file  .  Highest education level: Not on file  Occupational History  . Not on file  Social Needs  . Financial resource strain: Not on file  . Food insecurity:    Worry: Not on file    Inability: Not on file  . Transportation needs:    Medical: Not on file    Non-medical: Not on file  Tobacco Use  . Smoking status: Never Smoker  . Smokeless tobacco: Never Used  . Tobacco comment: quit smoking 16yr ago  Substance and Sexual Activity  . Alcohol use: No  . Drug use: No  . Sexual activity: Yes  Lifestyle  . Physical activity:    Days per week: Not on file    Minutes per session: Not on file  . Stress: Not on file  Relationships  . Social connections:    Talks on phone: Not on file    Gets together: Not on file    Attends religious service: Not on file    Active member of club or organization: Not on file    Attends meetings of clubs or organizations: Not on file    Relationship status: Not on file  . Intimate partner violence:    Fear of current or ex partner: Not on file  Emotionally abused: Not on file    Physically abused: Not on file    Forced sexual activity: Not on file  Other Topics Concern  . Not on file  Social History Narrative   Married.   2 children. 2 grandchildren.   Retired. Drove trucks for 43 years.   Enjoys traveling, spending time with family.    Past Surgical History:  Procedure Laterality Date  . CERVICAL SPINE SURGERY    . CIRCUMCISION  20+yrs ago  . COLONOSCOPY    . NOSE SURGERY  20+yrs ago  . SHOULDER SURGERY     right 20+yrs ago    Family History  Problem Relation Age of Onset  . Anesthesia problems Neg Hx   . Hypotension Neg Hx   . Malignant hyperthermia Neg Hx   . Pseudochol deficiency Neg Hx     No Known Allergies  Current Outpatient Medications on File Prior to Visit  Medication Sig Dispense Refill  . ACCU-CHEK FASTCLIX LANCETS MISC Check blood sugar twice a day and as directed. Dx E11.9 300 each 1  . Alcohol Swabs (B-D SINGLE USE  SWABS REGULAR) PADS Use as needed to check blood sugar. 300 each 5  . amLODipine (NORVASC) 10 MG tablet TAKE 1 TABLET EVERY DAY 90 tablet 1  . aspirin 325 MG tablet Take 325 mg by mouth daily.    . Blood Glucose Calibration (ACCU-CHEK AVIVA) SOLN Use as instructed. 1 each 1  . Blood Glucose Monitoring Suppl (ACCU-CHEK AVIVA PLUS) w/Device KIT Check blood sugar twice a day and as directed. Dx E11.9 1 kit 0  . chlorpheniramine-HYDROcodone (TUSSIONEX PENNKINETIC ER) 10-8 MG/5ML SUER Take 5 mLs by mouth at bedtime as needed. 140 mL 0  . fluticasone (FLONASE) 50 MCG/ACT nasal spray USE 2 SPRAYS INTO BOTH NOSTRILS DAILY. 48 g 1  . gabapentin (NEURONTIN) 100 MG capsule TAKE 1 CAPSULE  TWICE DAILY FOR NUMBNESS TO HANDS. 180 capsule 1  . glipiZIDE (GLUCOTROL) 10 MG tablet TAKE 1 TABLET (10 MG TOTAL) BY MOUTH 2 (TWO) TIMES DAILY. 180 tablet 1  . glucose blood (ACCU-CHEK AVIVA PLUS) test strip Check blood sugar twice a day and as directed. Dx E11.9 300 each 2  . hydrochlorothiazide (HYDRODIURIL) 25 MG tablet TAKE 1 TABLET BY MOUTH EVERY MORNING FOR BLOOD PRESSURE 90 tablet 1  . Insulin Glargine (LANTUS SOLOSTAR) 100 UNIT/ML Solostar Pen Inject 25 units in the morning and 25 units in the evening. 45 mL 1  . Insulin Pen Needle 31G X 8 MM MISC Use as directed to injection insulin. 300 each 2  . lisinopril (PRINIVIL,ZESTRIL) 10 MG tablet TAKE 1 TABLET EVERY DAY 90 tablet 1  . meloxicam (MOBIC) 7.5 MG tablet TAKE 1 TABLET TWICE DAILY  FOR  ELBOW 180 tablet 0  . metFORMIN (GLUCOPHAGE) 500 MG tablet TAKE 2 TABLETS TWICE DAILY WITH MEALS 360 tablet 1  . pioglitazone (ACTOS) 30 MG tablet TAKE 1 TABLET EVERY DAY 90 tablet 1  . simvastatin (ZOCOR) 20 MG tablet TAKE 1 TABLET BY MOUTH EVERY EVENING WITH SUPPER 90 tablet 1   No current facility-administered medications on file prior to visit.     BP 128/74   Pulse 65   Temp 98.2 F (36.8 C) (Oral)   Ht _0  (1.803 m)   Wt 239 lb 4 oz (108.5 kg)   SpO2 98%    BMI 33.37 kg/m    Objective:   Physical Exam  Constitutional: He appears well-nourished.  Neck: Neck supple.  Cardiovascular:  Normal rate and regular rhythm.  Respiratory: Effort normal and breath sounds normal.  Skin: Skin is warm and dry.           Assessment & Plan:

## 2018-07-23 NOTE — Assessment & Plan Note (Addendum)
A1C improved to 7.1 on recent labs. Continue current regimen of glipizide, metformin, actos, Lantus. Strongly advised he work on weight loss through diet and exercise.  Discussed to check glucose twice daily, rotating times.   Foot exam today. Eye exam UTD. Pneumonia vaccination UTD. Managed on ace and statin.  Follow up in 6 months.

## 2018-07-23 NOTE — Patient Instructions (Signed)
Continue Lantus 25 units twice daily, Metformin 1000 mg twice daily, glipizide 10 mg twice daily, actos 30 mg daily.   Continue to check your blood sugars, try to check twice daily rotating times. Before breakfast, lunch, dinner. 2 hours after breakfast, lunch, dinner Bedtime  Continue exercising. You should be getting 150 minutes of moderate intensity exercise weekly.  It is important that you improve your diet. Please limit carbohydrates in the form of white bread, rice, pasta, sweets, fast food, fried food, sugary drinks, etc. Increase your consumption of fresh fruits and vegetables, whole grains, lean protein.  Ensure you are consuming 64 ounces of water daily.  Please schedule a follow up appointment in 6 months for diabetes check.   It was a pleasure to see you today!   Diabetes Mellitus and Nutrition When you have diabetes (diabetes mellitus), it is very important to have healthy eating habits because your blood sugar (glucose) levels are greatly affected by what you eat and drink. Eating healthy foods in the appropriate amounts, at about the same times every day, can help you:  Control your blood glucose.  Lower your risk of heart disease.  Improve your blood pressure.  Reach or maintain a healthy weight.  Every person with diabetes is different, and each person has different needs for a meal plan. Your health care provider may recommend that you work with a diet and nutrition specialist (dietitian) to make a meal plan that is best for you. Your meal plan may vary depending on factors such as:  The calories you need.  The medicines you take.  Your weight.  Your blood glucose, blood pressure, and cholesterol levels.  Your activity level.  Other health conditions you have, such as heart or kidney disease.  How do carbohydrates affect me? Carbohydrates affect your blood glucose level more than any other type of food. Eating carbohydrates naturally increases the amount  of glucose in your blood. Carbohydrate counting is a method for keeping track of how many carbohydrates you eat. Counting carbohydrates is important to keep your blood glucose at a healthy level, especially if you use insulin or take certain oral diabetes medicines. It is important to know how many carbohydrates you can safely have in each meal. This is different for every person. Your dietitian can help you calculate how many carbohydrates you should have at each meal and for snack. Foods that contain carbohydrates include:  Bread, cereal, rice, pasta, and crackers.  Potatoes and corn.  Peas, beans, and lentils.  Milk and yogurt.  Fruit and juice.  Desserts, such as cakes, cookies, ice cream, and candy.  How does alcohol affect me? Alcohol can cause a sudden decrease in blood glucose (hypoglycemia), especially if you use insulin or take certain oral diabetes medicines. Hypoglycemia can be a life-threatening condition. Symptoms of hypoglycemia (sleepiness, dizziness, and confusion) are similar to symptoms of having too much alcohol. If your health care provider says that alcohol is safe for you, follow these guidelines:  Limit alcohol intake to no more than 1 drink per day for nonpregnant women and 2 drinks per day for men. One drink equals 12 oz of beer, 5 oz of wine, or 1 oz of hard liquor.  Do not drink on an empty stomach.  Keep yourself hydrated with water, diet soda, or unsweetened iced tea.  Keep in mind that regular soda, juice, and other mixers may contain a lot of sugar and must be counted as carbohydrates.  What are tips for following  this plan? Reading food labels  Start by checking the serving size on the label. The amount of calories, carbohydrates, fats, and other nutrients listed on the label are based on one serving of the food. Many foods contain more than one serving per package.  Check the total grams (g) of carbohydrates in one serving. You can calculate the  number of servings of carbohydrates in one serving by dividing the total carbohydrates by 15. For example, if a food has 30 g of total carbohydrates, it would be equal to 2 servings of carbohydrates.  Check the number of grams (g) of saturated and trans fats in one serving. Choose foods that have low or no amount of these fats.  Check the number of milligrams (mg) of sodium in one serving. Most people should limit total sodium intake to less than 2,300 mg per day.  Always check the nutrition information of foods labeled as "low-fat" or "nonfat". These foods may be higher in added sugar or refined carbohydrates and should be avoided.  Talk to your dietitian to identify your daily goals for nutrients listed on the label. Shopping  Avoid buying canned, premade, or processed foods. These foods tend to be high in fat, sodium, and added sugar.  Shop around the outside edge of the grocery store. This includes fresh fruits and vegetables, bulk grains, fresh meats, and fresh dairy. Cooking  Use low-heat cooking methods, such as baking, instead of high-heat cooking methods like deep frying.  Cook using healthy oils, such as olive, canola, or sunflower oil.  Avoid cooking with butter, cream, or high-fat meats. Meal planning  Eat meals and snacks regularly, preferably at the same times every day. Avoid going long periods of time without eating.  Eat foods high in fiber, such as fresh fruits, vegetables, beans, and whole grains. Talk to your dietitian about how many servings of carbohydrates you can eat at each meal.  Eat 4-6 ounces of lean protein each day, such as lean meat, chicken, fish, eggs, or tofu. 1 ounce is equal to 1 ounce of meat, chicken, or fish, 1 egg, or 1/4 cup of tofu.  Eat some foods each day that contain healthy fats, such as avocado, nuts, seeds, and fish. Lifestyle   Check your blood glucose regularly.  Exercise at least 30 minutes 5 or more days each week, or as told by  your health care provider.  Take medicines as told by your health care provider.  Do not use any products that contain nicotine or tobacco, such as cigarettes and e-cigarettes. If you need help quitting, ask your health care provider.  Work with a Social worker or diabetes educator to identify strategies to manage stress and any emotional and social challenges. What are some questions to ask my health care provider?  Do I need to meet with a diabetes educator?  Do I need to meet with a dietitian?  What number can I call if I have questions?  When are the best times to check my blood glucose? Where to find more information:  American Diabetes Association: diabetes.org/food-and-fitness/food  Academy of Nutrition and Dietetics: PokerClues.dk  [employer] of Diabetes and Digestive and Kidney Diseases (NIH): ContactWire.be Summary  A healthy meal plan will help you control your blood glucose and maintain a healthy lifestyle.  Working with a diet and nutrition specialist (dietitian) can help you make a meal plan that is best for you.  Keep in mind that carbohydrates and alcohol have immediate effects on your blood glucose  levels. It is important to count carbohydrates and to use alcohol carefully. This information is not intended to replace advice given to you by your health care provider. Make sure you discuss any questions you have with your health care provider. Document Released: 05/30/2005 Document Revised: 10/07/2016 Document Reviewed: 10/07/2016 Elsevier Interactive Patient Education  [employer].

## 2018-08-04 ENCOUNTER — Telehealth: Payer: Self-pay | Admitting: Primary Care

## 2018-08-04 ENCOUNTER — Other Ambulatory Visit: Payer: Self-pay | Admitting: Primary Care

## 2018-08-04 DIAGNOSIS — H02831 Dermatochalasis of right upper eyelid: Secondary | ICD-10-CM | POA: Diagnosis not present

## 2018-08-04 DIAGNOSIS — H25043 Posterior subcapsular polar age-related cataract, bilateral: Secondary | ICD-10-CM | POA: Diagnosis not present

## 2018-08-04 DIAGNOSIS — H2513 Age-related nuclear cataract, bilateral: Secondary | ICD-10-CM | POA: Diagnosis not present

## 2018-08-04 DIAGNOSIS — H18413 Arcus senilis, bilateral: Secondary | ICD-10-CM | POA: Diagnosis not present

## 2018-08-04 DIAGNOSIS — M1990 Unspecified osteoarthritis, unspecified site: Secondary | ICD-10-CM

## 2018-08-04 DIAGNOSIS — H2512 Age-related nuclear cataract, left eye: Secondary | ICD-10-CM | POA: Diagnosis not present

## 2018-08-04 DIAGNOSIS — H25013 Cortical age-related cataract, bilateral: Secondary | ICD-10-CM | POA: Diagnosis not present

## 2018-08-04 DIAGNOSIS — E119 Type 2 diabetes mellitus without complications: Secondary | ICD-10-CM

## 2018-08-04 MED ORDER — GLIPIZIDE 10 MG PO TABS: 10.0000 mg | ORAL_TABLET | Freq: Two times a day (BID) | ORAL | 0 refills | Status: DC

## 2018-08-04 MED ORDER — MELOXICAM 7.5 MG PO TABS: ORAL_TABLET | ORAL | 0 refills | Status: DC

## 2018-08-04 NOTE — Telephone Encounter (Signed)
Pt need refill on   Meloxicam 7.5mg  Glipizide 10 mg pt is out  [facility]

## 2018-08-04 NOTE — Telephone Encounter (Signed)
Called patient to confirm that he needed a refill since already since already refill through mail order.  Patient request to refill since he is out.  [facility] to [facility] requested.

## 2018-08-06 ENCOUNTER — Ambulatory Visit (INDEPENDENT_AMBULATORY_CARE_PROVIDER_SITE_OTHER): Payer: Medicare HMO

## 2018-08-06 DIAGNOSIS — Z23 Encounter for immunization: Secondary | ICD-10-CM | POA: Diagnosis not present

## 2018-08-17 ENCOUNTER — Other Ambulatory Visit: Payer: Self-pay | Admitting: Primary Care

## 2018-08-26 ENCOUNTER — Other Ambulatory Visit: Payer: Self-pay | Admitting: Primary Care

## 2018-08-26 DIAGNOSIS — M1990 Unspecified osteoarthritis, unspecified site: Secondary | ICD-10-CM

## 2018-08-26 DIAGNOSIS — E119 Type 2 diabetes mellitus without complications: Secondary | ICD-10-CM

## 2018-08-31 DIAGNOSIS — H2512 Age-related nuclear cataract, left eye: Secondary | ICD-10-CM | POA: Diagnosis not present

## 2018-09-01 DIAGNOSIS — H2511 Age-related nuclear cataract, right eye: Secondary | ICD-10-CM | POA: Diagnosis not present

## 2018-09-17 DIAGNOSIS — H2511 Age-related nuclear cataract, right eye: Secondary | ICD-10-CM | POA: Diagnosis not present

## 2018-09-18 DIAGNOSIS — H2511 Age-related nuclear cataract, right eye: Secondary | ICD-10-CM | POA: Diagnosis not present

## 2018-09-22 ENCOUNTER — Other Ambulatory Visit: Payer: Self-pay | Admitting: Primary Care

## 2018-09-22 DIAGNOSIS — I10 Essential (primary) hypertension: Secondary | ICD-10-CM

## 2018-09-30 ENCOUNTER — Other Ambulatory Visit: Payer: Self-pay | Admitting: Primary Care

## 2018-09-30 DIAGNOSIS — E118 Type 2 diabetes mellitus with unspecified complications: Secondary | ICD-10-CM

## 2018-09-30 DIAGNOSIS — Z794 Long term (current) use of insulin: Secondary | ICD-10-CM

## 2018-10-07 ENCOUNTER — Other Ambulatory Visit: Payer: Self-pay | Admitting: Primary Care

## 2018-10-07 DIAGNOSIS — J302 Other seasonal allergic rhinitis: Secondary | ICD-10-CM

## 2018-10-07 DIAGNOSIS — E119 Type 2 diabetes mellitus without complications: Secondary | ICD-10-CM

## 2018-10-28 ENCOUNTER — Other Ambulatory Visit: Payer: Self-pay | Admitting: Primary Care

## 2018-12-28 ENCOUNTER — Other Ambulatory Visit: Payer: Self-pay | Admitting: Primary Care

## 2019-01-02 ENCOUNTER — Other Ambulatory Visit: Payer: Self-pay | Admitting: Primary Care

## 2019-01-04 ENCOUNTER — Other Ambulatory Visit: Payer: Self-pay | Admitting: Primary Care

## 2019-01-04 DIAGNOSIS — I10 Essential (primary) hypertension: Secondary | ICD-10-CM

## 2019-01-21 ENCOUNTER — Ambulatory Visit (INDEPENDENT_AMBULATORY_CARE_PROVIDER_SITE_OTHER): Payer: Medicare HMO | Admitting: Primary Care

## 2019-01-21 ENCOUNTER — Other Ambulatory Visit: Payer: Self-pay

## 2019-01-21 ENCOUNTER — Encounter: Payer: Self-pay | Admitting: Primary Care

## 2019-01-21 VITALS — BP 128/78 | HR 82 | Temp 97.8°F | Ht 71.0 in | Wt 252.5 lb

## 2019-01-21 DIAGNOSIS — Z794 Long term (current) use of insulin: Secondary | ICD-10-CM

## 2019-01-21 DIAGNOSIS — E119 Type 2 diabetes mellitus without complications: Secondary | ICD-10-CM | POA: Diagnosis not present

## 2019-01-21 LAB — POCT GLYCOSYLATED HEMOGLOBIN (HGB A1C): Hemoglobin A1C: 7.9 % — AB (ref 4.0–5.6)

## 2019-01-21 NOTE — Patient Instructions (Signed)
Continue your current medications.   Consider splitting your insulin to 25 units twice daily for better coverage during the day.  It is important that you improve your diet. Please limit carbohydrates in the form of white bread, rice, pasta, sweets, fast food, fried food, sugary drinks, etc. Increase your consumption of fresh fruits and vegetables, whole grains, lean protein.  Ensure you are consuming 64 ounces of water daily.  Start exercising. You should be getting 150 minutes of moderate intensity exercise weekly.  Please schedule a physical with me for early September 2020. You may also schedule a lab only appointment 3-4 days prior. We will discuss your lab results in detail during your physical.  It was a pleasure to see you today!

## 2019-01-21 NOTE — Assessment & Plan Note (Signed)
A1C of 7.9 today which is an increase from 7.1 six months ago. This is likely due to inactivity from Covid-19. Also diet isn't the best.  Recommended he start exercising, work on diet. He is under 8.0 so we will continue his current regimen and plan to see him back in the late Summer 2020.  Foot exam UTD. Eye exam rescheduled for July. Pneumonia vaccination UTD. Managed on ACE and statin.  Continue current regimen.

## 2019-01-21 NOTE — Progress Notes (Signed)
Subjective:    Patient ID: Lawrence Parker, male    DOB: 1945/09/29, 73 y.o.   MRN: [id number]  HPI  Lawrence Parker is a 73 year old male who presents today for follow up of type 2 diabetes.  Current medications include: Glipizide 10 mg BID, metformin 1000 mg BID, Actos 30 mg daily, Lantus 25 units BID. He is actually injecting 50 units HS.   He is checking his blood glucose 1 times daily and is getting readings of:  AM fasting: low 100's  Highest reading: 150 Lowest reading: 90  Last A1C: 7.1 in November 2019 Last Eye Exam: Completed in 2019, due in August  Last Foot Exam: Due in November 2019 Pneumonia Vaccination: Completed in 2019 ACE/ARB: Lisinopril  Statin: Simvastatin   Diet currently consists of:  Breakfast: Eggs, bacon, sausage Lunch: Soup, hamburgers Dinner: Sandwich, frozen food, fast food Snacks: None Desserts: Infrequently  Beverages: Water, diet soda  Exercise: He is not exercising   BP Readings from Last 3 Encounters:  01/21/19 128/78  07/23/18 128/74  06/15/18 128/70      Review of Systems  Respiratory: Negative for shortness of breath.   Cardiovascular: Negative for chest pain.  Neurological: Negative for dizziness and numbness.       Past Medical History:  Diagnosis Date  . Anxiety    takes Xanax prn  . Arthritis    neck  . Back pain    deteriorating disc  . Bladder neck obstruction   . Colon polyps   . Depression    hx of but doesn't take any medications  . Diabetes mellitus    takes Actos,Glipizide,Metformin,and Lantus  . Diverticula of colon   . History of kidney stones    also has one at present time   . Hyperlipidemia    takes Zocor daily  . Hypertension    takes Amlodipine daily and HCTZ daily  . Neck pain    cervical spondylosis  . Peyronie disease      Social History   Socioeconomic History  . Marital status: Married    Spouse name: Not on file  . Number of children: Not on file  . Years of education: Not on  file  . Highest education level: Not on file  Occupational History  . Not on file  Social Needs  . Financial resource strain: Not on file  . Food insecurity:    Worry: Not on file    Inability: Not on file  . Transportation needs:    Medical: Not on file    Non-medical: Not on file  Tobacco Use  . Smoking status: Never Smoker  . Smokeless tobacco: Never Used  . Tobacco comment: quit smoking 29yr ago  Substance and Sexual Activity  . Alcohol use: No  . Drug use: No  . Sexual activity: Yes  Lifestyle  . Physical activity:    Days per week: Not on file    Minutes per session: Not on file  . Stress: Not on file  Relationships  . Social connections:    Talks on phone: Not on file    Gets together: Not on file    Attends religious service: Not on file    Active member of club or organization: Not on file    Attends meetings of clubs or organizations: Not on file    Relationship status: Not on file  . Intimate partner violence:    Fear of current or ex partner: Not on file  Emotionally abused: Not on file    Physically abused: Not on file    Forced sexual activity: Not on file  Other Topics Concern  . Not on file  Social History Narrative   Married.   2 children. 2 grandchildren.   Retired. Drove trucks for 43 years.   Enjoys traveling, spending time with family.    Past Surgical History:  Procedure Laterality Date  . CERVICAL SPINE SURGERY    . CIRCUMCISION  20+yrs ago  . COLONOSCOPY    . NOSE SURGERY  20+yrs ago  . SHOULDER SURGERY     right 20+yrs ago    Family History  Problem Relation Age of Onset  . Anesthesia problems Neg Hx   . Hypotension Neg Hx   . Malignant hyperthermia Neg Hx   . Pseudochol deficiency Neg Hx     No Known Allergies  Current Outpatient Medications on File Prior to Visit  Medication Sig Dispense Refill  . ACCU-CHEK FASTCLIX LANCETS MISC Check blood sugar twice a day and as directed. Dx E11.9 300 each 1  . Alcohol Swabs (B-D  SINGLE USE SWABS REGULAR) PADS Use as needed to check blood sugar. 300 each 5  . amLODipine (NORVASC) 10 MG tablet TAKE 1 TABLET EVERY DAY 90 tablet 1  . aspirin 325 MG tablet Take 325 mg by mouth daily.    . Blood Glucose Calibration (ACCU-CHEK AVIVA) SOLN Use as instructed. 1 each 1  . Blood Glucose Monitoring Suppl (ACCU-CHEK AVIVA PLUS) w/Device KIT Check blood sugar twice a day and as directed. Dx E11.9 1 kit 0  . fluticasone (FLONASE) 50 MCG/ACT nasal spray USE 2 SPRAYS INTO BOTH NOSTRILS DAILY. 48 g 1  . gabapentin (NEURONTIN) 100 MG capsule TAKE 1 CAPSULE  TWICE DAILY FOR NUMBNESS TO HANDS. 180 capsule 1  . glipiZIDE (GLUCOTROL) 10 MG tablet TAKE 1 TABLET BY MOUTH TWICE A DAY 60 tablet 0  . glucose blood (ACCU-CHEK AVIVA PLUS) test strip Check blood sugar twice a day and as directed. Dx E11.9 300 each 2  . hydrochlorothiazide (HYDRODIURIL) 25 MG tablet TAKE 1 TABLET EVERY MORNING FOR BLOOD PRESSURE 90 tablet 1  . Insulin Glargine (LANTUS SOLOSTAR) 100 UNIT/ML Solostar Pen INJECT 25 UNITS IN THE MORNING AND 25 UNITS IN THE EVENING. 45 mL 1  . Insulin Pen Needle 31G X 8 MM MISC Use as directed to injection insulin. 300 each 2  . lisinopril (PRINIVIL,ZESTRIL) 10 MG tablet TAKE 1 TABLET EVERY DAY 90 tablet 1  . meloxicam (MOBIC) 7.5 MG tablet TAKE 1 TABLET TWICE DAILY  FOR  ELBOW 60 tablet 0  . metFORMIN (GLUCOPHAGE) 500 MG tablet TAKE 2 TABLETS TWICE DAILY WITH MEALS 360 tablet 0  . pioglitazone (ACTOS) 30 MG tablet TAKE 1 TABLET EVERY DAY 90 tablet 1  . simvastatin (ZOCOR) 20 MG tablet TAKE 1 TABLET BY MOUTH EVERY EVENING WITH SUPPER 90 tablet 0   No current facility-administered medications on file prior to visit.     BP 128/78   Pulse 82   Temp 97.8 F (36.6 C) (Oral)   Ht _0  (1.803 m)   Wt 252 lb 8 oz (114.5 kg)   SpO2 98%   BMI 35.22 kg/m    Objective:   Physical Exam  Constitutional: He appears well-nourished.  Neck: Neck supple.  Cardiovascular: Normal rate and  regular rhythm.  Respiratory: Effort normal and breath sounds normal.  Skin: Skin is warm and dry.  Psychiatric: He has a normal mood  and affect.           Assessment & Plan:

## 2019-02-02 ENCOUNTER — Other Ambulatory Visit: Payer: Self-pay | Admitting: Primary Care

## 2019-02-02 DIAGNOSIS — I10 Essential (primary) hypertension: Secondary | ICD-10-CM

## 2019-02-09 ENCOUNTER — Ambulatory Visit (INDEPENDENT_AMBULATORY_CARE_PROVIDER_SITE_OTHER): Payer: Medicare HMO | Admitting: Family Medicine

## 2019-02-09 VITALS — BP 148/60 | HR 62 | Temp 97.9°F | Wt 232.0 lb

## 2019-02-09 DIAGNOSIS — R202 Paresthesia of skin: Secondary | ICD-10-CM | POA: Diagnosis not present

## 2019-02-09 DIAGNOSIS — B029 Zoster without complications: Secondary | ICD-10-CM | POA: Diagnosis not present

## 2019-02-09 MED ORDER — VALACYCLOVIR HCL 1 G PO TABS: 1000.0000 mg | ORAL_TABLET | Freq: Three times a day (TID) | ORAL | 0 refills | Status: AC

## 2019-02-09 MED ORDER — GABAPENTIN 100 MG PO CAPS: 100.0000 mg | ORAL_CAPSULE | Freq: Three times a day (TID) | ORAL | 0 refills | Status: DC

## 2019-02-09 NOTE — Progress Notes (Signed)
I connected with Lawrence Parker on 02/09/19 at  2:20 PM EDT by video and verified that I am speaking with the correct person using two identifiers.   I discussed the limitations, risks, security and privacy concerns of performing an evaluation and management service by video and the availability of in person appointments. I also discussed with the patient that there may be a patient responsible charge related to this service. The patient expressed understanding and agreed to proceed.  Patient location: Home Provider Location: [name] Participants: Lawrence Parker and Lawrence Parker and wife, Lawrence Parker    Subjective:     [name] is a 73 y.o. male presenting for Rash (around his waistline. Noticed it yesterday evening-02/09/2019. Pain is present. Back pain, fatigue. Has not felt good for the last 2 to 3 weeks.)     HPI   #Rash - painful - noticed it around the waistline yesterday - also endorses back pain and fatigue - rash started on the low back - and wrapped around the right side of the abdomen  - rash is very painful - only noticed it yesterday  - started with back pain  Had a cold a few months ago. Now with persistent cough. No fever/chills, has been slowly recovering.  Takes meloxicam daily Was on gabapentin   Review of Systems  Constitutional: Positive for fatigue. Negative for chills and fever.  HENT: Negative for congestion.   Respiratory: Positive for cough (dry).      Social History   Tobacco Use  Smoking Status Never Smoker  Smokeless Tobacco Never Used  Tobacco Comment   quit smoking 97yrs ago        Objective:   BP Readings from Last 3 Encounters:  02/09/19 (!) 148/60  01/21/19 128/78  07/23/18 128/74   Wt Readings from Last 3 Encounters:  02/09/19 232 lb (105.2 kg)  01/21/19 252 lb 8 oz (114.5 kg)  07/23/18 239 lb 4 oz (108.5 kg)   BP (!) 148/60   Pulse 62   Temp 97.9 F (36.6 C)   Wt 232 lb (105.2 kg)   BMI  32.36 kg/m    Physical Exam Constitutional:      Appearance: Normal appearance. He is not ill-appearing.  HENT:     Head: Normocephalic and atraumatic.     Right Ear: External ear normal.     Left Ear: External ear normal.  Eyes:     Conjunctiva/sclera: Conjunctivae normal.  Pulmonary:     Effort: Pulmonary effort is normal. No respiratory distress.  Skin:    Comments: Vesicular rash with erythematous base along the right lower back and abdomen.   Neurological:     Mental Status: He is alert. Mental status is at baseline.  Psychiatric:        Mood and Affect: Mood normal.        Behavior: Behavior normal.        Thought Content: Thought content normal.        Judgment: Judgment normal.             Assessment & Plan:   Problem List Items Addressed This Visit      Other   Paresthesia of both hands   Relevant Medications   gabapentin (NEURONTIN) 100 MG capsule    Other Visit Diagnoses    Herpes zoster without complication    -  Primary   Relevant Medications   valACYclovir (VALTREX) 1000 MG tablet   gabapentin (  NEURONTIN) 100 MG capsule     Diagnosis consistent with Zoster Due to already taking daily NSAID will do Gabapentin which had been prescribed for hand numbness though patient was out of the medication.   Advised f/u with pcp if fatigue symptoms do not improve and to discuss continuing gabapentin long term if needed.   Return in about 4 weeks (around 03/09/2019) for numbness follow/BP/fatigue.  Lawrence Noe, MD

## 2019-02-15 ENCOUNTER — Other Ambulatory Visit: Payer: Self-pay | Admitting: Primary Care

## 2019-02-15 DIAGNOSIS — J302 Other seasonal allergic rhinitis: Secondary | ICD-10-CM

## 2019-02-17 ENCOUNTER — Other Ambulatory Visit: Payer: Self-pay | Admitting: Primary Care

## 2019-02-24 ENCOUNTER — Other Ambulatory Visit: Payer: Self-pay | Admitting: Primary Care

## 2019-02-26 ENCOUNTER — Telehealth: Payer: Self-pay | Admitting: Primary Care

## 2019-02-26 NOTE — Telephone Encounter (Signed)
Spoken to patient.  He was taking 1 capsule 3 times a day. Yes, 100 mg TID Yes, it helped a little bit. The rash is getting better, smaller but it feels painful and feels like ants are crawling on him.

## 2019-02-26 NOTE — Telephone Encounter (Signed)
Message left for patient to return my call.  

## 2019-02-26 NOTE — Telephone Encounter (Signed)
Spoke with patient via phone, the gabapentin doesn't cause drowsiness. Discussed to increase to 1-2 capsules TID as gabapentin has helped slightly. He will update next week.

## 2019-02-26 NOTE — Telephone Encounter (Signed)
Please call patient:  He was prescribed gabapentin for shingles pain.  How much gabapentin is he taking and how often? 100 mg TID?  Has this helped at all? How is his rash?

## 2019-02-26 NOTE — Telephone Encounter (Signed)
Patient is calling back check status of request.  Phone- [phone]

## 2019-02-26 NOTE — Telephone Encounter (Signed)
Best number [phone] Pt called needing something for pain he saw dr cody 5/26 for shingles  [facility]

## 2019-03-03 ENCOUNTER — Other Ambulatory Visit: Payer: Self-pay | Admitting: Family Medicine

## 2019-03-03 DIAGNOSIS — B029 Zoster without complications: Secondary | ICD-10-CM

## 2019-03-03 DIAGNOSIS — R202 Paresthesia of skin: Secondary | ICD-10-CM

## 2019-03-03 NOTE — Telephone Encounter (Signed)
Pharmacy sent over request to change Gabapentin to 90 day supply.

## 2019-03-03 NOTE — Telephone Encounter (Signed)
How's he doing since I called him last week? How much gabapentin is he taking? How often (BID-TID)?  I need to fix his Rx. I could send the 300 mg dose if he feels he needs an increase.

## 2019-03-04 NOTE — Telephone Encounter (Signed)
Message left for patient to return my call.  

## 2019-03-10 ENCOUNTER — Telehealth: Payer: Self-pay | Admitting: Primary Care

## 2019-03-10 NOTE — Telephone Encounter (Signed)
Patient stated he is returning a call from the office he received yesterday    Patient C/B # - [phone]

## 2019-03-10 NOTE — Telephone Encounter (Signed)
Addressed in refill encounter

## 2019-03-10 NOTE — Telephone Encounter (Signed)
Noted, refill sent to pharmacy. 

## 2019-03-10 NOTE — Telephone Encounter (Signed)
Patient called back. He stated that he is taking most days 1 capsule TID, but when the pain is bad he takes 2 capsule. He stated that he does helps but feels he needs an increase.

## 2019-04-07 ENCOUNTER — Other Ambulatory Visit: Payer: Self-pay | Admitting: Primary Care

## 2019-04-07 DIAGNOSIS — B029 Zoster without complications: Secondary | ICD-10-CM

## 2019-04-07 DIAGNOSIS — M1990 Unspecified osteoarthritis, unspecified site: Secondary | ICD-10-CM

## 2019-04-07 DIAGNOSIS — R202 Paresthesia of skin: Secondary | ICD-10-CM

## 2019-04-07 DIAGNOSIS — E119 Type 2 diabetes mellitus without complications: Secondary | ICD-10-CM

## 2019-04-09 ENCOUNTER — Telehealth: Payer: Self-pay

## 2019-04-09 NOTE — Telephone Encounter (Signed)
We provided him with 540 capsules of gabapentin 1 month ago, does he actually need a refill? Refilled metformin. What is he taking the meloxicam for?  How often does he take?

## 2019-04-09 NOTE — Telephone Encounter (Signed)
Emily left v/m from [facility] left v/m requesting status of refill request for accuchek aviva plus meter and test strips, soft clix lancets, BD alcohol swabs and accu check aviva solution.Please advise.

## 2019-04-09 NOTE — Telephone Encounter (Signed)
Ok to refill?  gabapentin (NEURONTIN) 100 mg Last prescribed on 03/10/2019  metFORMIN (GLUCOPHAGE) 500 MG Last prescribed on 01/04/2019  meloxicam (MOBIC) 7.5 MG  Last prescribed on 08/04/2018  Last seen on 02/09/2019 with Dr Einar Pheasant for acute. Next appointment on 04/30/2019

## 2019-04-12 MED ORDER — ACCU-CHEK AVIVA PLUS W/DEVICE KIT: PACK | 0 refills | Status: DC

## 2019-04-12 MED ORDER — BD SWAB SINGLE USE REGULAR PADS: MEDICATED_PAD | 5 refills | Status: DC

## 2019-04-12 MED ORDER — ACCU-CHEK AVIVA PLUS VI STRP: ORAL_STRIP | 2 refills | Status: DC

## 2019-04-12 MED ORDER — MELOXICAM 7.5 MG PO TABS: ORAL_TABLET | ORAL | 0 refills | Status: DC

## 2019-04-12 MED ORDER — ACCU-CHEK AVIVA VI SOLN: 1 refills | Status: AC

## 2019-04-12 MED ORDER — ACCU-CHEK SOFT TOUCH LANCETS MISC: 2 refills | Status: AC

## 2019-04-12 NOTE — Telephone Encounter (Signed)
Spoken and notified patient of Lawrence Parker comments. Patient stated that he is good on the gabapentin. He just wanted it to go through his mail order for a future refill. 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 He did take meloxicam daily but has run out.

## 2019-04-12 NOTE — Telephone Encounter (Signed)
Sent as requested.

## 2019-04-12 NOTE — Telephone Encounter (Signed)
He was only provided with one month supply of Meloxicam in November 2019 so he can't be taking it daily. Will provide a short term supply until we can discuss.

## 2019-04-12 NOTE — Addendum Note (Signed)
Addended by: Jacqualin Combes on: 04/12/2019 04:22 PM   Modules accepted: Orders

## 2019-04-18 ENCOUNTER — Other Ambulatory Visit: Payer: Self-pay | Admitting: Primary Care

## 2019-04-18 DIAGNOSIS — I10 Essential (primary) hypertension: Secondary | ICD-10-CM

## 2019-04-18 DIAGNOSIS — E119 Type 2 diabetes mellitus without complications: Secondary | ICD-10-CM

## 2019-04-18 DIAGNOSIS — Z125 Encounter for screening for malignant neoplasm of prostate: Secondary | ICD-10-CM

## 2019-04-18 DIAGNOSIS — E785 Hyperlipidemia, unspecified: Secondary | ICD-10-CM

## 2019-04-23 ENCOUNTER — Other Ambulatory Visit: Payer: Self-pay

## 2019-04-23 ENCOUNTER — Ambulatory Visit: Payer: Medicare HMO

## 2019-04-23 ENCOUNTER — Ambulatory Visit (INDEPENDENT_AMBULATORY_CARE_PROVIDER_SITE_OTHER): Payer: Medicare HMO

## 2019-04-23 VITALS — Ht 72.0 in | Wt 232.0 lb

## 2019-04-23 DIAGNOSIS — Z0000 Encounter for general adult medical examination without abnormal findings: Secondary | ICD-10-CM | POA: Diagnosis not present

## 2019-04-23 NOTE — Progress Notes (Signed)
Subjective:   Lawrence Parker is a 73 y.o. male who presents for Medicare Annual/Subsequent preventive examination.  This visit type was conducted due to national recommendations for restrictions regarding the COVID-19 Pandemic (e.g. social distancing). This format is felt to be most appropriate for this patient at this time. All issues noted in this document were discussed and addressed. No physical exam was performed (except for noted visual exam findings with Video Visits). This patient, Lawrence Parker, has given permission to perform this visit via telephone. Vital signs may be absent or patient reported.  Patient location:  At home  Nurse location:  At home     Review of Systems:  n/a Cardiac Risk Factors include: advanced age (>65mn, >>51women);diabetes mellitus;dyslipidemia;hypertension;male gender;obesity (BMI >30kg/m2);sedentary lifestyle     Objective:    Vitals: Ht 6' (1.829 m) Comment: per patient  Wt 232 lb (105.2 kg) Comment: per patient  BMI 31.46 kg/m   Body mass index is 31.46 kg/m.  Advanced Directives 04/23/2019 04/20/2018 11/15/2011 11/08/2011  Does Patient Have a Medical Advance Directive? No Yes Patient does not have advance directive Patient does not have advance directive;Patient would like information  Type of Advance [place]ive - [place] - -  Copy of [place] Chart? - No - copy requested - -  Would patient like information on creating a medical advance directive? - - - Advance directive packet given  Pre-existing out of facility DNR order (yellow form or pink MOST form) - - No No    Tobacco Social History   Tobacco Use  Smoking Status Never Smoker  Smokeless Tobacco Never Used  Tobacco Comment   quit smoking 258yrago     Counseling given: Not Answered Comment: quit smoking [id number]   Clinical Intake:  Pre-visit preparation completed: Yes  Pain : 0-10 Pain Score: 7  Pain Type:  Acute pain(has shingles) Pain Location: Rib cage(shingles) Pain Orientation: Right Pain Descriptors / Indicators: Burning Pain Onset: More than a month ago Pain Frequency: Constant Pain Relieving Factors: medication  Pain Relieving Factors: medication  Nutritional Status: BMI > 30  Obese Nutritional Risks: None Diabetes: Yes CBG done?: No Did pt. bring in CBG monitor from home?: No  How often do you need to have someone help you when you read instructions, pamphlets, or other written materials from your doctor or pharmacy?: 1 - Never What is the last grade level you completed in school?: 9th grade  Interpreter Needed?: No  Information entered by :: NAllen LPN  Past Medical History:  Diagnosis Date  . Anxiety    takes Xanax prn  . Arthritis    neck  . Back pain    deteriorating disc  . Bladder neck obstruction   . Colon polyps   . Depression    hx of but doesn't take any medications  . Diabetes mellitus    takes Actos,Glipizide,Metformin,and Lantus  . Diverticula of colon   . History of kidney stones    also has one at present time   . Hyperlipidemia    takes Zocor daily  . Hypertension    takes Amlodipine daily and HCTZ daily  . Neck pain    cervical spondylosis  . Peyronie disease    Past Surgical History:  Procedure Laterality Date  . CERVICAL SPINE SURGERY    . CIRCUMCISION  20+yrs ago  . COLONOSCOPY    . NOSE SURGERY  20+yrs ago  . SHOULDER SURGERY  right 20+yrs ago   Family History  Problem Relation Age of Onset  . Cancer Mother   . Cancer Father   . Cancer Sister   . Diabetes Sister   . Cancer Sister   . Anesthesia problems Neg Hx   . Hypotension Neg Hx   . Malignant hyperthermia Neg Hx   . Pseudochol deficiency Neg Hx    Social History   Socioeconomic History  . Marital status: Widowed    Spouse name: Not on file  . Number of children: Not on file  . Years of education: Not on file  . Highest education level: Not on file   Occupational History  . Occupation: retired  Scientific laboratory technician  . Financial resource strain: Not hard at all  . Food insecurity    Worry: Never true    Inability: Never true  . Transportation needs    Medical: No    Non-medical: No  Tobacco Use  . Smoking status: Never Smoker  . Smokeless tobacco: Never Used  . Tobacco comment: quit smoking 16yr ago  Substance and Sexual Activity  . Alcohol use: No  . Drug use: No  . Sexual activity: Not Currently  Lifestyle  . Physical activity    Days per week: 0 days    Minutes per session: 0 min  . Stress: Not at all  Relationships  . Social [place] phone: Not on file    Gets together: Not on file    Attends religious service: Not on file    Active member of club or organization: Not on file    Attends meetings of clubs or organizations: Not on file    Relationship status: Not on file  Other Topics Concern  . Not on file  Social History Narrative   Married.   2 children. 2 grandchildren.   Retired. Drove trucks for 43 years.   Enjoys traveling, spending time with family.    Outpatient Encounter Medications as of 04/23/2019  Medication Sig  . ACCU-CHEK FASTCLIX LANCETS MISC Check blood sugar twice a day and as directed. Dx E11.9  . Alcohol Swabs (B-D SINGLE USE SWABS REGULAR) PADS Use as needed to check blood sugar daily.  .Marland KitchenamLODipine (NORVASC) 10 MG tablet TAKE 1 TABLET EVERY DAY  . aspirin 325 MG tablet Take 325 mg by mouth daily.  . Blood Glucose Calibration (ACCU-CHEK AVIVA) SOLN Use as instructed.  . Blood Glucose Monitoring Suppl (ACCU-CHEK AVIVA PLUS) w/Device KIT Check blood sugar twice a day and as directed. Dx E11.9  . fluticasone (FLONASE) 50 MCG/ACT nasal spray USE 2 SPRAYS IN EACH NOSTRIL EVERY DAY  . gabapentin (NEURONTIN) 100 MG capsule Take 1-2 capsules by mouth three times daily for nerve pain.  .Marland KitchenglipiZIDE (GLUCOTROL) 10 MG tablet TAKE 1 TABLET BY MOUTH TWICE A DAY  . glucose blood (ACCU-CHEK AVIVA  PLUS) test strip Check blood sugar twice a day and as directed. Dx E11.9  . hydrochlorothiazide (HYDRODIURIL) 25 MG tablet TAKE 1 TABLET EVERY MORNING FOR BLOOD PRESSURE  . Insulin Glargine (LANTUS SOLOSTAR) 100 UNIT/ML Solostar Pen INJECT 25 UNITS IN THE MORNING AND 25 UNITS IN THE EVENING.  . Insulin Pen Needle 31G X 8 MM MISC Use as directed to injection insulin.  . Lancets (ACCU-CHEK SOFT TOUCH) lancets Check blood sugar twice a day and as directed. Dx E11.9  . lisinopril (ZESTRIL) 10 MG tablet TAKE 1 TABLET EVERY DAY  . meloxicam (MOBIC) 7.5 MG tablet TAKE 1  TABLET TWICE DAILY  FOR  ELBOW as needed.  . metFORMIN (GLUCOPHAGE) 500 MG tablet TAKE 2 TABLETS TWICE DAILY WITH MEALS  . pioglitazone (ACTOS) 30 MG tablet TAKE 1 TABLET EVERY DAY  . simvastatin (ZOCOR) 20 MG tablet TAKE 1 TABLET BY MOUTH EVERY EVENING WITH SUPPER   No facility-administered encounter medications on file as of 04/23/2019.     Activities of Daily Living In your present state of health, do you have any difficulty performing the following activities: 04/23/2019  Hearing? N  Vision? N  Difficulty concentrating or making decisions? N  Walking or climbing stairs? N  Dressing or bathing? N  Doing errands, shopping? N  Preparing Food and eating ? N  Using the Toilet? N  In the past six months, have you accidently leaked urine? N  Do you have problems with loss of bowel control? N  Managing your Medications? N  Managing your Finances? N  Housekeeping or managing your Housekeeping? N  Some recent data might be hidden    Patient Care Team: Pleas Koch, NP as PCP - General (Internal Medicine)   Assessment:   This is a routine wellness examination for Almir.  Exercise Activities and Dietary recommendations Current Exercise Habits: The patient does not participate in regular exercise at present  Goals    . Exercise 150 min/wk Moderate Activity     04/23/2019, wants to start walking    . Patient Stated      Starting 04/20/2018, I will continue to take medications as prescribed.        Fall Risk Fall Risk  04/23/2019 04/20/2018 04/15/2017  Falls in the past year? 0 No No  Risk for fall due to : Medication side effect - -  Follow up Falls evaluation completed;Falls prevention discussed - -   Is the patient's home free of loose throw rugs in walkways, pet beds, electrical cords, etc?   yes      Grab bars in the bathroom? no      Handrails on the stairs?   yes      Adequate lighting?   yes  Timed Get Up and Go Performed: n/a  Depression Screen PHQ 2/9 Scores 04/23/2019 04/20/2018 04/15/2017  PHQ - 2 Score 0 0 0  PHQ- 9 Score 0 0 -    Cognitive Function MMSE - Mini Mental State Exam 04/23/2019 04/20/2018  Orientation to time 5 5  Orientation to Place 5 5  Registration 3 3  Attention/ Calculation 0 0  Attention/Calculation-comments could not spell word -  Recall 3 3  Language- name 2 objects 0 0  Language- repeat 1 1  Language- follow 3 step command 0 3  Language- read & follow direction 0 0  Write a sentence 0 0  Copy design 0 0  Total score 17 20   Mini Cog  Mini-Cog screen was completed. Maximum score is 22. A value of 0 denotes this part of the MMSE was not completed or the patient failed this part of the Mini-Cog screening.       Immunization History  Administered Date(s) Administered  . Influenza,inj,Quad PF,6+ Mos 07/15/2016, 10/16/2017, 08/06/2018  . Pneumococcal Conjugate-13 07/15/2016  . Pneumococcal Polysaccharide-23 11/16/2011, 04/20/2018    Qualifies for Shingles Vaccine? yes  Screening Tests Health Maintenance  Topic Date Due  . TETANUS/TDAP  01/04/1966  . OPHTHALMOLOGY EXAM  01/15/2019  . INFLUENZA VACCINE  04/17/2019  . FOOT EXAM  07/24/2019  . HEMOGLOBIN A1C  07/24/2019  . COLONOSCOPY  09/16/2021  . Hepatitis C Screening  Completed  . PNA vac Low Risk Adult  Completed   Cancer Screenings: Lung: Low Dose CT Chest recommended if Age 30-80 years, 30 pack-year  currently smoking OR have quit w/in 15years. Patient does not qualify. Colorectal: up to date  Additional Screenings:  Hepatitis C Screening:04/2017      Plan:    Patient wants to start walking for exercise.  I have personally reviewed and noted the following in the patient's chart:   . Medical and social history . Use of alcohol, tobacco or illicit drugs  . Current medications and supplements . Functional ability and status . Nutritional status . Physical activity . Advanced directives . List of other physicians . Hospitalizations, surgeries, and ER visits in previous 12 months . Vitals . Screenings to include cognitive, depression, and falls . Referrals and appointments  In addition, I have reviewed and discussed with patient certain preventive protocols, quality metrics, and best practice recommendations. A written personalized care plan for preventive services as well as general preventive health recommendations were provided to patient.     Kellie Simmering, LPN  03/23/3735

## 2019-04-23 NOTE — Progress Notes (Signed)
PCP notes:  Health Maintenance:  Tetanus and eye exam due.  Abnormal Screenings:  Mini-Cog: 17/22- could not spell world backwards  Patient concerns:  None  Nurse concerns:  None  Next PCP appt.: 04/30/2019 at 9:00

## 2019-04-23 NOTE — Patient Instructions (Signed)
Lawrence Parker , Thank you for taking time to come for your Medicare Wellness Visit. I appreciate your ongoing commitment to your health goals. Please review the following plan we discussed and let me know if I can assist you in the future.   Screening recommendations/referrals: Colonoscopy: 09/2011 Recommended yearly ophthalmology/optometry visit for glaucoma screening and checkup Recommended yearly dental visit for hygiene and checkup  Vaccinations: Influenza vaccine: 07/2018 Pneumococcal vaccine: 04/2018 Tdap vaccine: due Shingles vaccine: discussed    Advanced directives: Advance directive discussed with you today. Even though you declined this today please call our office should you change your mind and we can give you the proper paperwork for you to fill out.   Conditions/risks identified: obesity  Next appointment: 04/30/2019 at 9:00  Preventive Care 40 Years and Older, Male Preventive care refers to lifestyle choices and visits with your health care provider that can promote health and wellness. What does preventive care include?  A yearly physical exam. This is also called an annual well check.  Dental exams once or twice a year.  Routine eye exams. Ask your health care provider how often you should have your eyes checked.  Personal lifestyle choices, including:  Daily care of your teeth and gums.  Regular physical activity.  Eating a healthy diet.  Avoiding tobacco and drug use.  Limiting alcohol use.  Practicing safe sex.  Taking low doses of aspirin every day.  Taking vitamin and mineral supplements as recommended by your health care provider. What happens during an annual well check? The services and screenings done by your health care provider during your annual well check will depend on your age, overall health, lifestyle risk factors, and family history of disease. Counseling  Your health care provider may ask you questions about your:  Alcohol use.   Tobacco use.  Drug use.  Emotional well-being.  Home and relationship well-being.  Sexual activity.  Eating habits.  History of falls.  Memory and ability to understand (cognition).  Work and work Statistician. Screening  You may have the following tests or measurements:  Height, weight, and BMI.  Blood pressure.  Lipid and cholesterol levels. These may be checked every 5 years, or more frequently if you are over 72 years old.  Skin check.  Lung cancer screening. You may have this screening every year starting at age 72 if you have a 30-pack-year history of smoking and currently smoke or have quit within the past 15 years.  Fecal occult blood test (FOBT) of the stool. You may have this test every year starting at age 72.  Flexible sigmoidoscopy or colonoscopy. You may have a sigmoidoscopy every 5 years or a colonoscopy every 10 years starting at age 65.  Prostate cancer screening. Recommendations will vary depending on your family history and other risks.  Hepatitis C blood test.  Hepatitis B blood test.  Sexually transmitted disease (STD) testing.  Diabetes screening. This is done by checking your blood sugar (glucose) after you have not eaten for a while (fasting). You may have this done every 1-3 years.  Abdominal aortic aneurysm (AAA) screening. You may need this if you are a current or former smoker.  Osteoporosis. You may be screened starting at age 72 if you are at high risk. Talk with your health care provider about your test results, treatment options, and if necessary, the need for more tests. Vaccines  Your health care provider may recommend certain vaccines, such as:  Influenza vaccine. This is recommended every year.  Tetanus, diphtheria, and acellular pertussis (Tdap, Td) vaccine. You may need a Td booster every 10 years.  Zoster vaccine. You may need this after age 72.  Pneumococcal 13-valent conjugate (PCV13) vaccine. One dose is recommended  after age 72.  Pneumococcal polysaccharide (PPSV23) vaccine. One dose is recommended after age 72. Talk to your health care provider about which screenings and vaccines you need and how often you need them. This information is not intended to replace advice given to you by your health care provider. Make sure you discuss any questions you have with your health care provider. Document Released: 09/29/2015 Document Revised: 05/22/2016 Document Reviewed: 07/04/2015 Elsevier Interactive Patient Education  2017 [place] Prevention in the Home Falls can cause injuries. They can happen to people of all ages. There are many things you can do to make your home safe and to help prevent falls. What can I do on the outside of my home?  Regularly fix the edges of walkways and driveways and fix any cracks.  Remove anything that might make you trip as you walk through a door, such as a raised step or threshold.  Trim any bushes or trees on the path to your home.  Use bright outdoor lighting.  Clear any walking paths of anything that might make someone trip, such as rocks or tools.  Regularly check to see if handrails are loose or broken. Make sure that both sides of any steps have handrails.  Any raised decks and porches should have guardrails on the edges.  Have any leaves, snow, or ice cleared regularly.  Use sand or salt on walking paths during winter.  Clean up any spills in your garage right away. This includes oil or grease spills. What can I do in the bathroom?  Use night lights.  Install grab bars by the toilet and in the tub and shower. Do not use towel bars as grab bars.  Use non-skid mats or decals in the tub or shower.  If you need to sit down in the shower, use a plastic, non-slip stool.  Keep the floor dry. Clean up any water that spills on the floor as soon as it happens.  Remove soap buildup in the tub or shower regularly.  Attach bath mats securely with  double-sided non-slip rug tape.  Do not have throw rugs and other things on the floor that can make you trip. What can I do in the bedroom?  Use night lights.  Make sure that you have a light by your bed that is easy to reach.  Do not use any sheets or blankets that are too big for your bed. They should not hang down onto the floor.  Have a firm chair that has side arms. You can use this for support while you get dressed.  Do not have throw rugs and other things on the floor that can make you trip. What can I do in the kitchen?  Clean up any spills right away.  Avoid walking on wet floors.  Keep items that you use a lot in easy-to-reach places.  If you need to reach something above you, use a strong step stool that has a grab bar.  Keep electrical cords out of the way.  Do not use floor polish or wax that makes floors slippery. If you must use wax, use non-skid floor wax.  Do not have throw rugs and other things on the floor that can make you trip. What can I do  with my stairs?  Do not leave any items on the stairs.  Make sure that there are handrails on both sides of the stairs and use them. Fix handrails that are broken or loose. Make sure that handrails are as long as the stairways.  Check any carpeting to make sure that it is firmly attached to the stairs. Fix any carpet that is loose or worn.  Avoid having throw rugs at the top or bottom of the stairs. If you do have throw rugs, attach them to the floor with carpet tape.  Make sure that you have a light switch at the top of the stairs and the bottom of the stairs. If you do not have them, ask someone to add them for you. What else can I do to help prevent falls?  Wear shoes that:  Do not have high heels.  Have rubber bottoms.  Are comfortable and fit you well.  Are closed at the toe. Do not wear sandals.  If you use a stepladder:  Make sure that it is fully opened. Do not climb a closed stepladder.  Make  sure that both sides of the stepladder are locked into place.  Ask someone to hold it for you, if possible.  Clearly mark and make sure that you can see:  Any grab bars or handrails.  First and last steps.  Where the edge of each step is.  Use tools that help you move around (mobility aids) if they are needed. These include:  Canes.  Walkers.  Scooters.  Crutches.  Turn on the lights when you go into a dark area. Replace any light bulbs as soon as they burn out.  Set up your furniture so you have a clear path. Avoid moving your furniture around.  If any of your floors are uneven, fix them.  If there are any pets around you, be aware of where they are.  Review your medicines with your doctor. Some medicines can make you feel dizzy. This can increase your chance of falling. Ask your doctor what other things that you can do to help prevent falls. This information is not intended to replace advice given to you by your health care provider. Make sure you discuss any questions you have with your health care provider. Document Released: 06/29/2009 Document Revised: 02/08/2016 Document Reviewed: 10/07/2014 Elsevier Interactive Patient Education  2017 [employer].

## 2019-04-26 ENCOUNTER — Other Ambulatory Visit: Payer: Self-pay

## 2019-04-26 ENCOUNTER — Other Ambulatory Visit (INDEPENDENT_AMBULATORY_CARE_PROVIDER_SITE_OTHER): Payer: Medicare HMO

## 2019-04-26 DIAGNOSIS — Z794 Long term (current) use of insulin: Secondary | ICD-10-CM

## 2019-04-26 DIAGNOSIS — Z125 Encounter for screening for malignant neoplasm of prostate: Secondary | ICD-10-CM

## 2019-04-26 DIAGNOSIS — E785 Hyperlipidemia, unspecified: Secondary | ICD-10-CM

## 2019-04-26 DIAGNOSIS — E119 Type 2 diabetes mellitus without complications: Secondary | ICD-10-CM | POA: Diagnosis not present

## 2019-04-26 DIAGNOSIS — I10 Essential (primary) hypertension: Secondary | ICD-10-CM

## 2019-04-26 LAB — COMPREHENSIVE METABOLIC PANEL
ALT: 16 U/L (ref 0–53)
AST: 15 U/L (ref 0–37)
Albumin: 4 g/dL (ref 3.5–5.2)
Alkaline Phosphatase: 43 U/L (ref 39–117)
BUN: 36 mg/dL — ABNORMAL HIGH (ref 6–23)
CO2: 27 mEq/L (ref 19–32)
Calcium: 9 mg/dL (ref 8.4–10.5)
Chloride: 104 mEq/L (ref 96–112)
Creatinine, Ser: 1.41 mg/dL (ref 0.40–1.50)
GFR: 49.37 mL/min — ABNORMAL LOW (ref >60.00)
Glucose, Bld: 123 mg/dL — ABNORMAL HIGH (ref 70–99)
Potassium: 4.6 mEq/L (ref 3.5–5.1)
Sodium: 140 mEq/L (ref 135–145)
Total Bilirubin: 0.6 mg/dL (ref 0.2–1.2)
Total Protein: 6.1 g/dL (ref 6.0–8.3)

## 2019-04-26 LAB — LIPID PANEL
Cholesterol: 132 mg/dL (ref 0–200)
HDL: 40.1 mg/dL (ref >39.00)
LDL Cholesterol: 65 mg/dL (ref 0–99)
NonHDL: 91.53
Total CHOL/HDL Ratio: 3
Triglycerides: 131 mg/dL (ref 0.0–149.0)
VLDL: 26.2 mg/dL (ref 0.0–40.0)

## 2019-04-26 LAB — PSA, MEDICARE: PSA: 1.01 ng/ml (ref 0.10–4.00)

## 2019-04-26 LAB — HEMOGLOBIN A1C: Hgb A1c MFr Bld: 9.4 % — ABNORMAL HIGH (ref 4.6–6.5)

## 2019-04-28 ENCOUNTER — Other Ambulatory Visit: Payer: Self-pay | Admitting: Primary Care

## 2019-04-28 DIAGNOSIS — E113391 Type 2 diabetes mellitus with moderate nonproliferative diabetic retinopathy without macular edema, right eye: Secondary | ICD-10-CM | POA: Diagnosis not present

## 2019-04-28 DIAGNOSIS — E118 Type 2 diabetes mellitus with unspecified complications: Secondary | ICD-10-CM

## 2019-04-28 DIAGNOSIS — H3582 Retinal ischemia: Secondary | ICD-10-CM | POA: Diagnosis not present

## 2019-04-28 DIAGNOSIS — E113312 Type 2 diabetes mellitus with moderate nonproliferative diabetic retinopathy with macular edema, left eye: Secondary | ICD-10-CM | POA: Diagnosis not present

## 2019-04-28 DIAGNOSIS — Z794 Long term (current) use of insulin: Secondary | ICD-10-CM

## 2019-04-30 ENCOUNTER — Ambulatory Visit (INDEPENDENT_AMBULATORY_CARE_PROVIDER_SITE_OTHER): Payer: Medicare HMO | Admitting: Primary Care

## 2019-04-30 ENCOUNTER — Other Ambulatory Visit: Payer: Self-pay

## 2019-04-30 ENCOUNTER — Other Ambulatory Visit: Payer: Self-pay | Admitting: Primary Care

## 2019-04-30 ENCOUNTER — Encounter: Payer: Self-pay | Admitting: Primary Care

## 2019-04-30 VITALS — BP 140/82 | HR 80 | Temp 97.9°F | Ht 72.0 in | Wt 235.5 lb

## 2019-04-30 DIAGNOSIS — E785 Hyperlipidemia, unspecified: Secondary | ICD-10-CM | POA: Diagnosis not present

## 2019-04-30 DIAGNOSIS — M1990 Unspecified osteoarthritis, unspecified site: Secondary | ICD-10-CM

## 2019-04-30 DIAGNOSIS — Z0000 Encounter for general adult medical examination without abnormal findings: Secondary | ICD-10-CM | POA: Diagnosis not present

## 2019-04-30 DIAGNOSIS — Z794 Long term (current) use of insulin: Secondary | ICD-10-CM | POA: Diagnosis not present

## 2019-04-30 DIAGNOSIS — E119 Type 2 diabetes mellitus without complications: Secondary | ICD-10-CM

## 2019-04-30 DIAGNOSIS — N289 Disorder of kidney and ureter, unspecified: Secondary | ICD-10-CM

## 2019-04-30 DIAGNOSIS — M792 Neuralgia and neuritis, unspecified: Secondary | ICD-10-CM | POA: Diagnosis not present

## 2019-04-30 DIAGNOSIS — I10 Essential (primary) hypertension: Secondary | ICD-10-CM

## 2019-04-30 NOTE — Assessment & Plan Note (Signed)
Immunizations up-to-date, he will receive shingles vaccination next year. PSA up-to-date. Colonoscopy up-to-date per patient. Strongly advised he work on a healthy diet and regular exercise. Exam today unremarkable. Labs reviewed.

## 2019-04-30 NOTE — Assessment & Plan Note (Addendum)
Managed on gabapentin 100 mg for which he is taking 200-300 mg BID.  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  Continue current regimen, he will update with any changes.

## 2019-04-30 NOTE — Assessment & Plan Note (Signed)
Borderline high, would like to see him lower. He would like to work on weight loss through healthy diet and exercise. Follow-up in 3 months, and if above goal we will increase his lisinopril.

## 2019-04-30 NOTE — Patient Instructions (Signed)
Stop Meloxicam for pain now. You can take Tylenol Arthritis 1000 mg every 8 hours as needed for pain.  You can take 300 mg of gabapentin three times daily if needed.  It is important that you improve your diet. Please limit carbohydrates in the form of white bread, rice, pasta, sweets, fast food, fried food, sugary drinks, etc. Increase your consumption of fresh fruits and vegetables, whole grains, lean protein.  Ensure you are consuming 64 ounces of water daily.  Start exercising. You should be getting 150 minutes of moderate intensity exercise weekly.  Start checking your blood sugars at least twice daily, rotate times of checks. Before any meal, 2 hours after any meal, bedtime.  Schedule a lab only appointment for 2 weeks for repeat kidney function check.  Please schedule a follow up appointment in 3 months for diabetes check.  It was a pleasure to see you today!   Preventive Care 24 Years and Older, Male Preventive care refers to lifestyle choices and visits with your health care provider that can promote health and wellness. This includes:  A yearly physical exam. This is also called an annual well check.  Regular dental and eye exams.  Immunizations.  Screening for certain conditions.  Healthy lifestyle choices, such as diet and exercise. What can I expect for my preventive care visit? Physical exam Your health care provider will check:  Height and weight. These may be used to calculate body mass index (BMI), which is a measurement that tells if you are at a healthy weight.  Heart rate and blood pressure.  Your skin for abnormal spots. Counseling Your health care provider may ask you questions about:  Alcohol, tobacco, and drug use.  Emotional well-being.  Home and relationship well-being.  Sexual activity.  Eating habits.  History of falls.  Memory and ability to understand (cognition).  Work and work Statistician. What immunizations do I need?   Influenza (flu) vaccine  This is recommended every year. Tetanus, diphtheria, and pertussis (Tdap) vaccine  You may need a Td booster every 10 years. Varicella (chickenpox) vaccine  You may need this vaccine if you have not already been vaccinated. Zoster (shingles) vaccine  You may need this after age 68. Pneumococcal conjugate (PCV13) vaccine  One dose is recommended after age 64. Pneumococcal polysaccharide (PPSV23) vaccine  One dose is recommended after age 45. Measles, mumps, and rubella (MMR) vaccine  You may need at least one dose of MMR if you were born in 1957 or later. You may also need a second dose. Meningococcal conjugate (MenACWY) vaccine  You may need this if you have certain conditions. Hepatitis A vaccine  You may need this if you have certain conditions or if you travel or work in places where you may be exposed to hepatitis A. Hepatitis B vaccine  You may need this if you have certain conditions or if you travel or work in places where you may be exposed to hepatitis B. Haemophilus influenzae type b (Hib) vaccine  You may need this if you have certain conditions. You may receive vaccines as individual doses or as more than one vaccine together in one shot (combination vaccines). Talk with your health care provider about the risks and benefits of combination vaccines. What tests do I need? Blood tests  Lipid and cholesterol levels. These may be checked every 5 years, or more frequently depending on your overall health.  Hepatitis C test.  Hepatitis B test. Screening  Lung cancer screening. You may have  this screening every year starting at age 76 if you have a 30-pack-year history of smoking and currently smoke or have quit within the past 15 years.  Colorectal cancer screening. All adults should have this screening starting at age 57 and continuing until age 86. Your health care provider may recommend screening at age 45 if you are at increased risk.  You will have tests every 1-10 years, depending on your results and the type of screening test.  Prostate cancer screening. Recommendations will vary depending on your family history and other risks.  Diabetes screening. This is done by checking your blood sugar (glucose) after you have not eaten for a while (fasting). You may have this done every 1-3 years.  Abdominal aortic aneurysm (AAA) screening. You may need this if you are a current or former smoker.  Sexually transmitted disease (STD) testing. Follow these instructions at home: Eating and drinking  Eat a diet that includes fresh fruits and vegetables, whole grains, lean protein, and low-fat dairy products. Limit your intake of foods with high amounts of sugar, saturated fats, and salt.  Take vitamin and mineral supplements as recommended by your health care provider.  Do not drink alcohol if your health care provider tells you not to drink.  If you drink alcohol: ? Limit how much you have to 0-2 drinks a day. ? Be aware of how much alcohol is in your drink. In the [place] one drink equals one 12 oz bottle of beer (355 mL), one 5 oz glass of wine (148 mL), or one 1 oz glass of hard liquor (44 mL). Lifestyle  Take daily care of your teeth and gums.  Stay active. Exercise for at least 30 minutes on 5 or more days each week.  Do not use any products that contain nicotine or tobacco, such as cigarettes, e-cigarettes, and chewing tobacco. If you need help quitting, ask your health care provider.  If you are sexually active, practice safe sex. Use a condom or other form of protection to prevent STIs (sexually transmitted infections).  Talk with your health care provider about taking a low-dose aspirin or statin. What's next?  Visit your health care provider once a year for a well check visit.  Ask your health care provider how often you should have your eyes and teeth checked.  Stay up to date on all vaccines. This information  is not intended to replace advice given to you by your health care provider. Make sure you discuss any questions you have with your health care provider. Document Released: 09/29/2015 Document Revised: 08/27/2018 Document Reviewed: 08/27/2018 Elsevier Patient Education  2020 [employer].

## 2019-04-30 NOTE — Assessment & Plan Note (Addendum)
Managed on Meloxicam for which he recently restarted, has not taken for 6 months prior. Discussed to stop given recent decrease in GFR.   Discussed use of Tylenol arthritis, not to exceed 3000 mg in 24 hours.

## 2019-04-30 NOTE — Assessment & Plan Note (Signed)
Recent LDL of 65, well controlled.  Continue current regimen.

## 2019-04-30 NOTE — Assessment & Plan Note (Signed)
Significant decrease in renal function when compared to 1 year ago.  He has not been taking meloxicam or any other NSAIDs.  Discussed to avoid taking the newly prescribed meloxicam prescription.  He endorses that he is not drinking much water, strongly advised he increase water intake.  Decrease in renal function could be secondary to uncontrolled diabetes, dehydration, other.  We will start with a repeat renal function test in 2 weeks, consider renal ultrasound if needed.  He is managed on ACE for renal protection against diabetes.

## 2019-04-30 NOTE — Progress Notes (Signed)
Subjective:    Patient ID: Lawrence Parker, male    DOB: 17-Jun-1946, 73 y.o.   MRN: [id number]  HPI  Lawrence Parker is a 73 year old male who presents today for complete physical.  Immunizations: -Influenza: Duet this season -Pneumonia: Completed last in 2019 -Shingles: History of recent Shingles   Diet: He endorses a poor diet. He's been eating country ham, fast food/take out food. He's not eating desserts/sweets. He is drinking G2 Gatorade, little water.  Exercise: He is not exercising  Eye exam: Completed recently Dental exam: No recent exam Colonoscopy: Completed in 2013 per patient PSA: 1.01 in 2020 Hep C Screen: Negative   BP Readings from Last 3 Encounters:  04/30/19 140/82  02/09/19 (!) 148/60  01/21/19 128/78   Wt Readings from Last 3 Encounters:  04/30/19 235 lb 8 oz (106.8 kg)  04/23/19 232 lb (105.2 kg)  02/09/19 232 lb (105.2 kg)     Review of Systems  Constitutional: Negative for unexpected weight change.  HENT: Negative for rhinorrhea.   Respiratory: Negative for cough and shortness of breath.   Cardiovascular: Negative for chest pain.  Gastrointestinal: Negative for constipation and diarrhea.  Genitourinary: Negative for difficulty urinating.  Musculoskeletal: Positive for arthralgias.  Skin: Negative for rash.  Allergic/Immunologic: Positive for environmental allergies.  Neurological: Negative for dizziness, numbness and headaches.  Psychiatric/Behavioral: The patient is not nervous/anxious.        Past Medical History:  Diagnosis Date  . Anxiety    takes Xanax prn  . Arthritis    neck  . Back pain    deteriorating disc  . Bladder neck obstruction   . Colon polyps   . Depression    hx of but doesn't take any medications  . Diabetes mellitus    takes Actos,Glipizide,Metformin,and Lantus  . Diverticula of colon   . History of kidney stones    also has one at present time   . Hyperlipidemia    takes Zocor daily  . Hypertension    takes  Amlodipine daily and HCTZ daily  . Neck pain    cervical spondylosis  . Peyronie disease      Social History   Socioeconomic History  . Marital status: Widowed    Spouse name: Not on file  . Number of children: Not on file  . Years of education: Not on file  . Highest education level: Not on file  Occupational History  . Occupation: retired  Scientific laboratory technician  . Financial resource strain: Not hard at all  . Food insecurity    Worry: Never true    Inability: Never true  . Transportation needs    Medical: No    Non-medical: No  Tobacco Use  . Smoking status: Never Smoker  . Smokeless tobacco: Never Used  . Tobacco comment: quit smoking 41yr ago  Substance and Sexual Activity  . Alcohol use: No  . Drug use: No  . Sexual activity: Not Currently  Lifestyle  . Physical activity    Days per week: 0 days    Minutes per session: 0 min  . Stress: Not at all  Relationships  . Social [place] phone: Not on file    Gets together: Not on file    Attends religious service: Not on file    Active member of club or organization: Not on file    Attends meetings of clubs or organizations: Not on file    Relationship status: Not on  file  . Intimate partner violence    Fear of current or ex partner: No    Emotionally abused: No    Physically abused: No    Forced sexual activity: No  Other Topics Concern  . Not on file  Social History Narrative   Married.   2 children. 2 grandchildren.   Retired. Drove trucks for 43 years.   Enjoys traveling, spending time with family.    Past Surgical History:  Procedure Laterality Date  . CERVICAL SPINE SURGERY    . CIRCUMCISION  20+yrs ago  . COLONOSCOPY    . NOSE SURGERY  20+yrs ago  . SHOULDER SURGERY     right 20+yrs ago    Family History  Problem Relation Age of Onset  . Cancer Mother   . Cancer Father   . Cancer Sister   . Diabetes Sister   . Cancer Sister   . Anesthesia problems Neg Hx   . Hypotension Neg Hx    . Malignant hyperthermia Neg Hx   . Pseudochol deficiency Neg Hx     No Known Allergies  Current Outpatient Medications on File Prior to Visit  Medication Sig Dispense Refill  . ACCU-CHEK FASTCLIX LANCETS MISC Check blood sugar twice a day and as directed. Dx E11.9 300 each 1  . Alcohol Swabs (B-D SINGLE USE SWABS REGULAR) PADS Use as needed to check blood sugar daily. 300 each 5  . amLODipine (NORVASC) 10 MG tablet TAKE 1 TABLET EVERY DAY 90 tablet 1  . aspirin 325 MG tablet Take 325 mg by mouth daily.    . Blood Glucose Calibration (ACCU-CHEK AVIVA) SOLN Use as instructed. 1 each 1  . Blood Glucose Monitoring Suppl (ACCU-CHEK AVIVA PLUS) w/Device KIT Check blood sugar twice a day and as directed. Dx E11.9 1 kit 0  . fluticasone (FLONASE) 50 MCG/ACT nasal spray USE 2 SPRAYS IN EACH NOSTRIL EVERY DAY 48 g 1  . gabapentin (NEURONTIN) 100 MG capsule Take 1-2 capsules by mouth three times daily for nerve pain. 540 capsule 0  . glipiZIDE (GLUCOTROL) 10 MG tablet TAKE 1 TABLET BY MOUTH TWICE A DAY 60 tablet 0  . glucose blood (ACCU-CHEK AVIVA PLUS) test strip Check blood sugar twice a day and as directed. Dx E11.9 300 each 2  . hydrochlorothiazide (HYDRODIURIL) 25 MG tablet TAKE 1 TABLET EVERY MORNING FOR BLOOD PRESSURE 90 tablet 1  . Insulin Glargine (LANTUS SOLOSTAR) 100 UNIT/ML Solostar Pen INJECT 25 UNITS SUBCUTANEOUSLY IN THE MORNING AND 25 UNITS IN THE EVENING. 45 mL 1  . Insulin Pen Needle 31G X 8 MM MISC Use as directed to injection insulin. 300 each 2  . Lancets (ACCU-CHEK SOFT TOUCH) lancets Check blood sugar twice a day and as directed. Dx E11.9 300 each 2  . lisinopril (ZESTRIL) 10 MG tablet TAKE 1 TABLET EVERY DAY 90 tablet 1  . metFORMIN (GLUCOPHAGE) 500 MG tablet TAKE 2 TABLETS TWICE DAILY WITH MEALS 360 tablet 0  . pioglitazone (ACTOS) 30 MG tablet TAKE 1 TABLET EVERY DAY 90 tablet 1  . simvastatin (ZOCOR) 20 MG tablet TAKE 1 TABLET BY MOUTH EVERY EVENING WITH SUPPER 90 tablet 0    No current facility-administered medications on file prior to visit.     BP 140/82   Pulse 80   Temp 97.9 F (36.6 C) (Temporal)   Ht 6' (1.829 m)   Wt 235 lb 8 oz (106.8 kg)   SpO2 98%   BMI 31.94 kg/m  Objective:   Physical Exam  Constitutional: He is oriented to person, place, and time. He appears well-nourished.  HENT:  Mouth/Throat: No oropharyngeal exudate.  Eyes: Pupils are equal, round, and reactive to light. EOM are normal.  Neck: Neck supple. No thyromegaly present.  Cardiovascular: Normal rate and regular rhythm.  Respiratory: Effort normal and breath sounds normal.  GI: Soft. Bowel sounds are normal. There is no abdominal tenderness.  Musculoskeletal: Normal range of motion.  Neurological: He is alert and oriented to person, place, and time.  Skin: Skin is warm and dry.  Psychiatric: He has a normal mood and affect.           Assessment & Plan:

## 2019-04-30 NOTE — Assessment & Plan Note (Addendum)
Checking glucose every morning before breakfast which is running 90's-125. Compliant to Lantus 25 units BID, Glipizide BID, Actos, Metformin.   Recent A1C of 9.4 which is above goal.  He endorses a poor diet over the last several months.  Long discussion today regarding the need to get his A1c under better control.  He has been able to lower his A1c down to 7.1 through healthy lifestyle changes without a medication adjustment.  He assures me he will work on lifestyle changes.  We will continue his current regimen for now.  Follow-up in 3 months for repeat A1c and diabetes check.

## 2019-05-04 ENCOUNTER — Other Ambulatory Visit: Payer: Self-pay | Admitting: Primary Care

## 2019-05-04 DIAGNOSIS — N289 Disorder of kidney and ureter, unspecified: Secondary | ICD-10-CM

## 2019-05-13 ENCOUNTER — Other Ambulatory Visit: Payer: Self-pay | Admitting: Primary Care

## 2019-05-13 DIAGNOSIS — I10 Essential (primary) hypertension: Secondary | ICD-10-CM

## 2019-05-14 ENCOUNTER — Other Ambulatory Visit (INDEPENDENT_AMBULATORY_CARE_PROVIDER_SITE_OTHER): Payer: Medicare HMO

## 2019-05-14 ENCOUNTER — Other Ambulatory Visit: Payer: Self-pay

## 2019-05-14 DIAGNOSIS — N289 Disorder of kidney and ureter, unspecified: Secondary | ICD-10-CM

## 2019-05-14 LAB — BASIC METABOLIC PANEL
BUN: 15 mg/dL (ref 6–23)
CO2: 28 mEq/L (ref 19–32)
Calcium: 9.1 mg/dL (ref 8.4–10.5)
Chloride: 103 mEq/L (ref 96–112)
Creatinine, Ser: 1.07 mg/dL (ref 0.40–1.50)
GFR: 67.87 mL/min (ref >60.00)
Glucose, Bld: 98 mg/dL (ref 70–99)
Potassium: 4.4 mEq/L (ref 3.5–5.1)
Sodium: 138 mEq/L (ref 135–145)

## 2019-05-28 ENCOUNTER — Telehealth: Payer: Self-pay | Admitting: Primary Care

## 2019-05-28 DIAGNOSIS — B029 Zoster without complications: Secondary | ICD-10-CM

## 2019-05-28 DIAGNOSIS — R202 Paresthesia of skin: Secondary | ICD-10-CM

## 2019-05-28 MED ORDER — GABAPENTIN 100 MG PO CAPS: ORAL_CAPSULE | ORAL | 0 refills | Status: DC

## 2019-05-28 NOTE — Telephone Encounter (Signed)
Patient called and said Anda Kraft said she would increase his Gabapentin from 100 mg to 300 mg for shingles. Patient said he only has 10 pills left and he uses 3 pills at a time to equal 300 mg.  Patient uses [facility].

## 2019-05-28 NOTE — Telephone Encounter (Addendum)
Spoken to patient that I have went ahead and refill his current regimen. Will discuss with Anda Kraft when she come back. According to her notes patient supposed to update but patient stated that Anda Kraft is supposed to send in 300 mg instead of 100 mg.

## 2019-05-31 ENCOUNTER — Telehealth: Payer: Self-pay | Admitting: Primary Care

## 2019-05-31 NOTE — Telephone Encounter (Signed)
Spoken and notified patient of Lawrence Millers comments. No, he does note need a refill. Patient stated that he has been taking 3 capsule 3 times a day

## 2019-05-31 NOTE — Telephone Encounter (Signed)
Noted, have him call us when he gets low on the 100 mg dose so I can change to the 300 mg capsules.

## 2019-05-31 NOTE — Telephone Encounter (Signed)
Patient stated that Anda Kraft was going to sent him 300 mg gabapentin the next time he needed a refill since he still having the nerve pain. Is that correct?

## 2019-05-31 NOTE — Telephone Encounter (Signed)
Yes, is he needing a refill now? It looks like someone refilled the 100 mg dose in my absence. Please explain that to him. Also, how often is he taking the 100 mg dose? Twice daily? More?

## 2019-06-01 NOTE — Telephone Encounter (Signed)
Spoken and notified patient of [name]'s comments. Patient verbalized understanding.  

## 2019-06-01 NOTE — Telephone Encounter (Signed)
Message left for patient to return my call.  

## 2019-06-07 ENCOUNTER — Other Ambulatory Visit: Payer: Self-pay | Admitting: Primary Care

## 2019-06-07 DIAGNOSIS — E119 Type 2 diabetes mellitus without complications: Secondary | ICD-10-CM

## 2019-07-01 ENCOUNTER — Other Ambulatory Visit: Payer: Self-pay | Admitting: Primary Care

## 2019-07-01 DIAGNOSIS — I10 Essential (primary) hypertension: Secondary | ICD-10-CM

## 2019-07-16 ENCOUNTER — Other Ambulatory Visit: Payer: Self-pay | Admitting: Primary Care

## 2019-07-16 DIAGNOSIS — J302 Other seasonal allergic rhinitis: Secondary | ICD-10-CM

## 2019-08-02 ENCOUNTER — Encounter: Payer: Self-pay | Admitting: Primary Care

## 2019-08-02 ENCOUNTER — Ambulatory Visit (INDEPENDENT_AMBULATORY_CARE_PROVIDER_SITE_OTHER): Payer: Medicare HMO | Admitting: Primary Care

## 2019-08-02 ENCOUNTER — Other Ambulatory Visit: Payer: Self-pay

## 2019-08-02 VITALS — BP 128/70 | HR 62 | Temp 98.5°F | Ht 71.0 in | Wt 239.5 lb

## 2019-08-02 DIAGNOSIS — M792 Neuralgia and neuritis, unspecified: Secondary | ICD-10-CM

## 2019-08-02 DIAGNOSIS — Z794 Long term (current) use of insulin: Secondary | ICD-10-CM

## 2019-08-02 DIAGNOSIS — E119 Type 2 diabetes mellitus without complications: Secondary | ICD-10-CM | POA: Diagnosis not present

## 2019-08-02 DIAGNOSIS — Z23 Encounter for immunization: Secondary | ICD-10-CM | POA: Diagnosis not present

## 2019-08-02 LAB — POCT GLYCOSYLATED HEMOGLOBIN (HGB A1C): Hemoglobin A1C: 8.7 % — AB (ref 4.0–5.6)

## 2019-08-02 MED ORDER — GABAPENTIN 300 MG PO CAPS: 300.0000 mg | ORAL_CAPSULE | Freq: Every day | ORAL | 0 refills | Status: DC

## 2019-08-02 NOTE — Patient Instructions (Addendum)
Start exercising. You should be getting 150 minutes of exercise weekly.  It is important that you improve your diet. Please limit carbohydrates in the form of white bread, rice, pasta, sweets, fast food, fried food, sugary drinks, etc. Increase your consumption of fresh fruits and vegetables, whole grains, lean protein.  Ensure you are consuming 64 ounces of water daily.  Continue your current dose of insulin and diabetes medications.  Please schedule a follow up appointment in 3 months for diabetes check.  It was a pleasure to see you today!

## 2019-08-02 NOTE — Progress Notes (Signed)
Subjective:    Patient ID: Lawrence Parker, male    DOB: 11-08-1945, 73 y.o.   MRN: [id number]  HPI  Lawrence Parker is a 73 year old male who presents today for follow up of diabetes.  Current medications include: Lantus 25 units BID, Glipizide 10 mg BID, Metformin 500 mg BID, Actos 30 mg. He is injecting Lantus 50 mg units every evening.   He endorses a poor diet and is not exercising.   He is checking his blood glucose 1 times daily and is getting readings of:  AM fasting: 120's  Highest reading: 150 Lowest reading: 90  Last A1C: 9.4 in August 2020 Last Eye Exam: Completed in 2020 Last Foot Exam: Due today Pneumonia Vaccination: Completed last in 2019 ACE/ARB: Lisinopril  Statin: Simvastatin  BP Readings from Last 3 Encounters:  08/02/19 128/70  04/30/19 140/82  02/09/19 (!) 148/60     Review of Systems  Eyes: Negative for visual disturbance.  Respiratory: Negative for shortness of breath.   Cardiovascular: Negative for chest pain.  Neurological: Negative for dizziness.       Right sided tingling/pain since herpes zoster       Past Medical History:  Diagnosis Date  . Anxiety    takes Xanax prn  . Arthritis    neck  . Back pain    deteriorating disc  . Bladder neck obstruction   . Colon polyps   . Depression    hx of but doesn't take any medications  . Diabetes mellitus    takes Actos,Glipizide,Metformin,and Lantus  . Diverticula of colon   . History of kidney stones    also has one at present time   . Hyperlipidemia    takes Zocor daily  . Hypertension    takes Amlodipine daily and HCTZ daily  . Neck pain    cervical spondylosis  . Peyronie disease      Social History   Socioeconomic History  . Marital status: Widowed    Spouse name: Not on file  . Number of children: Not on file  . Years of education: Not on file  . Highest education level: Not on file  Occupational History  . Occupation: retired  Scientific laboratory technician  . Financial resource  strain: Not hard at all  . Food insecurity    Worry: Never true    Inability: Never true  . Transportation needs    Medical: No    Non-medical: No  Tobacco Use  . Smoking status: Former Smoker    Types: Cigarettes  . Smokeless tobacco: Never Used  . Tobacco comment: quit smoking 51yr ago  Substance and Sexual Activity  . Alcohol use: No  . Drug use: No  . Sexual activity: Not Currently  Lifestyle  . Physical activity    Days per week: 0 days    Minutes per session: 0 min  . Stress: Not at all  Relationships  . Social [place] phone: Not on file    Gets together: Not on file    Attends religious service: Not on file    Active member of club or organization: Not on file    Attends meetings of clubs or organizations: Not on file    Relationship status: Not on file  . Intimate partner violence    Fear of current or ex partner: No    Emotionally abused: No    Physically abused: No    Forced sexual activity: No  Other Topics  Concern  . Not on file  Social History Narrative   Married.   2 children. 2 grandchildren.   Retired. Drove trucks for 43 years.   Enjoys traveling, spending time with family.    Past Surgical History:  Procedure Laterality Date  . CERVICAL SPINE SURGERY    . CIRCUMCISION  20+yrs ago  . COLONOSCOPY    . NOSE SURGERY  20+yrs ago  . SHOULDER SURGERY     right 20+yrs ago    Family History  Problem Relation Age of Onset  . Cancer Mother   . Cancer Father   . Cancer Sister   . Diabetes Sister   . Cancer Sister   . Anesthesia problems Neg Hx   . Hypotension Neg Hx   . Malignant hyperthermia Neg Hx   . Pseudochol deficiency Neg Hx     No Known Allergies  Current Outpatient Medications on File Prior to Visit  Medication Sig Dispense Refill  . ACCU-CHEK FASTCLIX LANCETS MISC Check blood sugar twice a day and as directed. Dx E11.9 300 each 1  . Alcohol Swabs (B-D SINGLE USE SWABS REGULAR) PADS Use as needed to check blood  sugar daily. 300 each 5  . amLODipine (NORVASC) 10 MG tablet TAKE 1 TABLET EVERY DAY 90 tablet 1  . aspirin 325 MG tablet Take 325 mg by mouth daily.    . Blood Glucose Calibration (ACCU-CHEK AVIVA) SOLN Use as instructed. 1 each 1  . Blood Glucose Monitoring Suppl (ACCU-CHEK AVIVA PLUS) w/Device KIT Check blood sugar twice a day and as directed. Dx E11.9 1 kit 0  . fluticasone (FLONASE) 50 MCG/ACT nasal spray USE 2 SPRAYS IN EACH NOSTRIL EVERY DAY 48 g 1  . glipiZIDE (GLUCOTROL) 10 MG tablet TAKE 1 TABLET BY MOUTH TWICE A DAY 60 tablet 0  . glucose blood (ACCU-CHEK AVIVA PLUS) test strip Check blood sugar twice a day and as directed. Dx E11.9 300 each 2  . hydrochlorothiazide (HYDRODIURIL) 25 MG tablet TAKE 1 TABLET EVERY MORNING FOR BLOOD PRESSURE 90 tablet 1  . Insulin Glargine (LANTUS SOLOSTAR) 100 UNIT/ML Solostar Pen INJECT 25 UNITS SUBCUTANEOUSLY IN THE MORNING AND 25 UNITS IN THE EVENING. 45 mL 1  . Insulin Pen Needle 31G X 8 MM MISC Use as directed to injection insulin. 300 each 2  . Lancets (ACCU-CHEK SOFT TOUCH) lancets Check blood sugar twice a day and as directed. Dx E11.9 300 each 2  . lisinopril (ZESTRIL) 10 MG tablet TAKE 1 TABLET EVERY DAY 90 tablet 1  . metFORMIN (GLUCOPHAGE) 500 MG tablet TAKE 2 TABLETS TWICE DAILY WITH MEALS 360 tablet 1  . pioglitazone (ACTOS) 30 MG tablet TAKE 1 TABLET EVERY DAY 90 tablet 1  . simvastatin (ZOCOR) 20 MG tablet TAKE 1 TABLET BY MOUTH EVERY EVENING WITH SUPPER 90 tablet 1   No current facility-administered medications on file prior to visit.     BP 128/70   Pulse 62   Temp 98.5 F (36.9 C) (Skin)   Ht _0  (1.803 m)   Wt 239 lb 8 oz (108.6 kg)   BMI 33.40 kg/m    Objective:   Physical Exam  Constitutional: He appears well-nourished.  Neck: Neck supple.  Cardiovascular: Normal rate and regular rhythm.  Respiratory: Effort normal and breath sounds normal.  Skin: Skin is warm and dry.  Psychiatric: He has a normal mood and  affect.           Assessment & Plan:

## 2019-08-02 NOTE — Assessment & Plan Note (Signed)
A1C today of 8.7 which is an improvement from last visit but still above goal.  Strongly advised he work on his diet and start exercising. He declines further increases in insulin regimen as he would like to work on lifestyle changes, this seems reasonable.   Foot exam today. Eye exam UTD per patient. Pneumonia vaccination.  Managed on statin and ACE.  Follow up in 3 months.

## 2019-08-02 NOTE — Addendum Note (Signed)
Addended by: Emelia Salisbury C on: 08/02/2019 11:05 AM   Modules accepted: Orders

## 2019-08-23 ENCOUNTER — Other Ambulatory Visit: Payer: Self-pay | Admitting: Primary Care

## 2019-08-23 DIAGNOSIS — M792 Neuralgia and neuritis, unspecified: Secondary | ICD-10-CM

## 2019-08-24 NOTE — Telephone Encounter (Signed)
Spoken and notified patient of Lawrence Parker's comments. Patient verbalized understanding.  

## 2019-08-24 NOTE — Telephone Encounter (Signed)
RX sent to pharmacy  

## 2019-08-24 NOTE — Telephone Encounter (Signed)
Patient said he's asking for a refill because he lost his rx.  Patient said he's out of his medication. Please call patient when rx is sent to pharmacy at [phone].

## 2019-09-15 ENCOUNTER — Other Ambulatory Visit: Payer: Self-pay | Admitting: Primary Care

## 2019-10-01 ENCOUNTER — Other Ambulatory Visit: Payer: Self-pay | Admitting: Primary Care

## 2019-10-01 DIAGNOSIS — I10 Essential (primary) hypertension: Secondary | ICD-10-CM

## 2019-10-21 ENCOUNTER — Other Ambulatory Visit: Payer: Self-pay | Admitting: Primary Care

## 2019-10-21 DIAGNOSIS — E119 Type 2 diabetes mellitus without complications: Secondary | ICD-10-CM

## 2019-11-02 ENCOUNTER — Encounter: Payer: Self-pay | Admitting: Primary Care

## 2019-11-02 ENCOUNTER — Ambulatory Visit (INDEPENDENT_AMBULATORY_CARE_PROVIDER_SITE_OTHER): Payer: Medicare HMO | Admitting: Primary Care

## 2019-11-02 ENCOUNTER — Other Ambulatory Visit: Payer: Self-pay

## 2019-11-02 VITALS — BP 128/74 | HR 80 | Temp 96.9°F | Ht 71.0 in | Wt 235.5 lb

## 2019-11-02 DIAGNOSIS — E118 Type 2 diabetes mellitus with unspecified complications: Secondary | ICD-10-CM

## 2019-11-02 DIAGNOSIS — Z794 Long term (current) use of insulin: Secondary | ICD-10-CM | POA: Diagnosis not present

## 2019-11-02 DIAGNOSIS — G479 Sleep disorder, unspecified: Secondary | ICD-10-CM | POA: Insufficient documentation

## 2019-11-02 DIAGNOSIS — E119 Type 2 diabetes mellitus without complications: Secondary | ICD-10-CM | POA: Diagnosis not present

## 2019-11-02 DIAGNOSIS — M792 Neuralgia and neuritis, unspecified: Secondary | ICD-10-CM

## 2019-11-02 LAB — POCT GLYCOSYLATED HEMOGLOBIN (HGB A1C): Hemoglobin A1C: 8.5 % — AB (ref 4.0–5.6)

## 2019-11-02 MED ORDER — GABAPENTIN 300 MG PO CAPS: 300.0000 mg | ORAL_CAPSULE | Freq: Three times a day (TID) | ORAL | 0 refills | Status: DC

## 2019-11-02 MED ORDER — LANTUS SOLOSTAR 100 UNIT/ML ~~LOC~~ SOPN: PEN_INJECTOR | SUBCUTANEOUS | 1 refills | Status: DC

## 2019-11-02 NOTE — Assessment & Plan Note (Signed)
Unclear etiology, difficulty falling asleep mostly. Discussed good bedtime hygiene. Discussed use of Melatonin 5-10 mg. He will update.

## 2019-11-02 NOTE — Patient Instructions (Addendum)
We've increased your dose of gabapentin to 300 mg three times daily. I sent a new prescription to [facility].  We've increased the dose of your Lantus to 30 units in the morning and 25 units in the evening.   Start checking your blood sugar levels more frequently. Appropriate times to check your blood sugar levels are:  -Before any meal (breakfast, lunch, dinner) -Two hours after any meal (breakfast, lunch, dinner) -Bedtime  Record your readings and notify me if you continue to consistently run at or above 200.  Please schedule a follow up appointment in 3 months.  It was a pleasure to see you today!   Diabetes Mellitus and Nutrition, Adult When you have diabetes (diabetes mellitus), it is very important to have healthy eating habits because your blood sugar (glucose) levels are greatly affected by what you eat and drink. Eating healthy foods in the appropriate amounts, at about the same times every day, can help you:  Control your blood glucose.  Lower your risk of heart disease.  Improve your blood pressure.  Reach or maintain a healthy weight. Every person with diabetes is different, and each person has different needs for a meal plan. Your health care provider may recommend that you work with a diet and nutrition specialist (dietitian) to make a meal plan that is best for you. Your meal plan may vary depending on factors such as:  The calories you need.  The medicines you take.  Your weight.  Your blood glucose, blood pressure, and cholesterol levels.  Your activity level.  Other health conditions you have, such as heart or kidney disease. How do carbohydrates affect me? Carbohydrates, also called carbs, affect your blood glucose level more than any other type of food. Eating carbs naturally raises the amount of glucose in your blood. Carb counting is a method for keeping track of how many carbs you eat. Counting carbs is important to keep your blood glucose at a healthy  level, especially if you use insulin or take certain oral diabetes medicines. It is important to know how many carbs you can safely have in each meal. This is different for every person. Your dietitian can help you calculate how many carbs you should have at each meal and for each snack. Foods that contain carbs include:  Bread, cereal, rice, pasta, and crackers.  Potatoes and corn.  Peas, beans, and lentils.  Milk and yogurt.  Fruit and juice.  Desserts, such as cakes, cookies, ice cream, and candy. How does alcohol affect me? Alcohol can cause a sudden decrease in blood glucose (hypoglycemia), especially if you use insulin or take certain oral diabetes medicines. Hypoglycemia can be a life-threatening condition. Symptoms of hypoglycemia (sleepiness, dizziness, and confusion) are similar to symptoms of having too much alcohol. If your health care provider says that alcohol is safe for you, follow these guidelines:  Limit alcohol intake to no more than 1 drink per day for nonpregnant women and 2 drinks per day for men. One drink equals 12 oz of beer, 5 oz of wine, or 1 oz of hard liquor.  Do not drink on an empty stomach.  Keep yourself hydrated with water, diet soda, or unsweetened iced tea.  Keep in mind that regular soda, juice, and other mixers may contain a lot of sugar and must be counted as carbs. What are tips for following this plan?  Reading food labels  Start by checking the serving size on the "Nutrition Facts" label of packaged foods and  drinks. The amount of calories, carbs, fats, and other nutrients listed on the label is based on one serving of the item. Many items contain more than one serving per package.  Check the total grams (g) of carbs in one serving. You can calculate the number of servings of carbs in one serving by dividing the total carbs by 15. For example, if a food has 30 g of total carbs, it would be equal to 2 servings of carbs.  Check the number of  grams (g) of saturated and trans fats in one serving. Choose foods that have low or no amount of these fats.  Check the number of milligrams (mg) of salt (sodium) in one serving. Most people should limit total sodium intake to less than 2,300 mg per day.  Always check the nutrition information of foods labeled as "low-fat" or "nonfat". These foods may be higher in added sugar or refined carbs and should be avoided.  Talk to your dietitian to identify your daily goals for nutrients listed on the label. Shopping  Avoid buying canned, premade, or processed foods. These foods tend to be high in fat, sodium, and added sugar.  Shop around the outside edge of the grocery store. This includes fresh fruits and vegetables, bulk grains, fresh meats, and fresh dairy. Cooking  Use low-heat cooking methods, such as baking, instead of high-heat cooking methods like deep frying.  Cook using healthy oils, such as olive, canola, or sunflower oil.  Avoid cooking with butter, cream, or high-fat meats. Meal planning  Eat meals and snacks regularly, preferably at the same times every day. Avoid going long periods of time without eating.  Eat foods high in fiber, such as fresh fruits, vegetables, beans, and whole grains. Talk to your dietitian about how many servings of carbs you can eat at each meal.  Eat 4-6 ounces (oz) of lean protein each day, such as lean meat, chicken, fish, eggs, or tofu. One oz of lean protein is equal to: ? 1 oz of meat, chicken, or fish. ? 1 egg. ?  cup of tofu.  Eat some foods each day that contain healthy fats, such as avocado, nuts, seeds, and fish. Lifestyle  Check your blood glucose regularly.  Exercise regularly as told by your health care provider. This may include: ? 150 minutes of moderate-intensity or vigorous-intensity exercise each week. This could be brisk walking, biking, or water aerobics. ? Stretching and doing strength exercises, such as yoga or  weightlifting, at least 2 times a week.  Take medicines as told by your health care provider.  Do not use any products that contain nicotine or tobacco, such as cigarettes and e-cigarettes. If you need help quitting, ask your health care provider.  Work with a Social worker or diabetes educator to identify strategies to manage stress and any emotional and social challenges. Questions to ask a health care provider  Do I need to meet with a diabetes educator?  Do I need to meet with a dietitian?  What number can I call if I have questions?  When are the best times to check my blood glucose? Where to find more information:  American Diabetes Association: diabetes.org  Academy of Nutrition and Dietetics: www.eatright.[employer] of Diabetes and Digestive and Kidney Diseases (NIH): [employer] Summary  A healthy meal plan will help you control your blood glucose and maintain a healthy lifestyle.  Working with a diet and nutrition specialist (dietitian) can help you make a meal plan that is  best for you.  Keep in mind that carbohydrates (carbs) and alcohol have immediate effects on your blood glucose levels. It is important to count carbs and to use alcohol carefully. This information is not intended to replace advice given to you by your health care provider. Make sure you discuss any questions you have with your health care provider. Document Revised: 08/15/2017 Document Reviewed: 10/07/2016 Elsevier Patient Education  2020 [employer].

## 2019-11-02 NOTE — Progress Notes (Signed)
Subjective:    Patient ID: Lawrence Parker, male    DOB: 10-22-1945, 74 y.o.   MRN: [id number]  HPI  This visit occurred during the SARS-CoV-2 public health emergency.  Safety protocols were in place, including screening questions prior to the visit, additional usage of staff PPE, and extensive cleaning of exam room while observing appropriate contact time as indicated for disinfecting solutions.   [name] is a 74 year old male with a history of diabetes, PAD, hypertension, hyperlipidemia, paresthesia of hands, herpes zoster who presents today for follow up of diabetes and gabapentin refill. He would also like to discuss insomnia.  1) Type 2 Diabetes:   Current medications include: Glipizide 10 mg BID, Lantus 25 units BID, Metformin 1000 mg BID, Actos 30 mg. He is injecting 50 units of Lantus in the evening.   He is checking his blood glucose 1-2 times daily and is getting readings of:  AM fasting: 120's Evenings (before dinner/sometimes within 2 hours after meals): 120's  Last A1C: 8.7 in November 2020, 8.5 today Last Eye Exam: Due, he will reschedule.  Last Foot Exam: Due in November 2021 Pneumonia Vaccination: Completed in 2019 ACE/ARB: Lisinopril  Statin: Simvastatin   BP Readings from Last 3 Encounters:  11/02/19 128/74  08/02/19 128/70  04/30/19 140/82   2) Insomnia: Difficulty falling asleep, will lay awake in bed for hours, acute for the last one month. Denies feeling anxious/mind racing thoughts. He doesn't wake when he falls asleep. He's not tried anything OTC for symptoms.   3) Herpes Zoster: He is currently managed on gabapentin 300 mg every morning and nightly for nerve pain secondary to herpes zoster. He continues to struggle with pain to the site of his shingles. Would like a dose increase.     Review of Systems  Eyes: Negative for visual disturbance.  Respiratory: Negative for shortness of breath.   Cardiovascular: Negative for chest pain.  Neurological:  Positive for numbness. Negative for dizziness.       Tingling to site of herpes zoster       Past Medical History:  Diagnosis Date  . Anxiety    takes Xanax prn  . Arthritis    neck  . Back pain    deteriorating disc  . Bladder neck obstruction   . Colon polyps   . Depression    hx of but doesn't take any medications  . Diabetes mellitus    takes Actos,Glipizide,Metformin,and Lantus  . Diverticula of colon   . History of kidney stones    also has one at present time   . Hyperlipidemia    takes Zocor daily  . Hypertension    takes Amlodipine daily and HCTZ daily  . Neck pain    cervical spondylosis  . Peyronie disease      Social History   Socioeconomic History  . Marital status: Widowed    Spouse name: Not on file  . Number of children: Not on file  . Years of education: Not on file  . Highest education level: Not on file  Occupational History  . Occupation: retired  Tobacco Use  . Smoking status: Former Smoker    Types: Cigarettes  . Smokeless tobacco: Never Used  . Tobacco comment: quit smoking 94yr ago  Substance and Sexual Activity  . Alcohol use: No  . Drug use: No  . Sexual activity: Not Currently  Other Topics Concern  . Not on file  Social History Narrative   Married.  2 children. 2 grandchildren.   Retired. Drove trucks for 43 years.   Enjoys traveling, spending time with family.   Social Determinants of Health   Financial Resource Strain: Low Risk   . Difficulty of Paying Living Expenses: Not hard at all  Food Insecurity: No Food Insecurity  . Worried About Charity fundraiser in the Last Year: Never true  . Ran Out of Food in the Last Year: Never true  Transportation Needs: No Transportation Needs  . Lack of Transportation (Medical): No  . Lack of Transportation (Non-Medical): No  Physical Activity: Inactive  . Days of Exercise per Week: 0 days  . Minutes of Exercise per Session: 0 min  Stress: No Stress Concern Present  . Feeling  of Stress : Not at all  Social Connections:   . Frequency of Communication with Friends and Family: Not on file  . Frequency of Social Gatherings with Friends and Family: Not on file  . Attends Religious Services: Not on file  . Active Member of Clubs or Organizations: Not on file  . Attends Archivist Meetings: Not on file  . Marital Status: Not on file  Intimate Partner Violence: Not At Risk  . Fear of Current or Ex-Partner: No  . Emotionally Abused: No  . Physically Abused: No  . Sexually Abused: No    Past Surgical History:  Procedure Laterality Date  . CERVICAL SPINE SURGERY    . CIRCUMCISION  20+yrs ago  . COLONOSCOPY    . NOSE SURGERY  20+yrs ago  . SHOULDER SURGERY     right 20+yrs ago    Family History  Problem Relation Age of Onset  . Cancer Mother   . Cancer Father   . Cancer Sister   . Diabetes Sister   . Cancer Sister   . Anesthesia problems Neg Hx   . Hypotension Neg Hx   . Malignant hyperthermia Neg Hx   . Pseudochol deficiency Neg Hx     No Known Allergies  Current Outpatient Medications on File Prior to Visit  Medication Sig Dispense Refill  . ACCU-CHEK FASTCLIX LANCETS MISC Check blood sugar twice a day and as directed. Dx E11.9 300 each 1  . Alcohol Swabs (B-D SINGLE USE SWABS REGULAR) PADS Use as needed to check blood sugar daily. 300 each 5  . amLODipine (NORVASC) 10 MG tablet TAKE 1 TABLET EVERY DAY 90 tablet 1  . aspirin 325 MG tablet Take 325 mg by mouth daily.    . Blood Glucose Calibration (ACCU-CHEK AVIVA) SOLN Use as instructed. 1 each 1  . Blood Glucose Monitoring Suppl (ACCU-CHEK AVIVA PLUS) w/Device KIT Check blood sugar twice a day and as directed. Dx E11.9 1 kit 0  . fluticasone (FLONASE) 50 MCG/ACT nasal spray USE 2 SPRAYS IN EACH NOSTRIL EVERY DAY 48 g 1  . gabapentin (NEURONTIN) 300 MG capsule TAKE 1 CAPSULE BY MOUTH EVERYDAY AT BEDTIME 90 capsule 0  . glipiZIDE (GLUCOTROL) 10 MG tablet TAKE 1 TABLET BY MOUTH TWICE A DAY  60 tablet 0  . glucose blood (ACCU-CHEK AVIVA PLUS) test strip Check blood sugar twice a day and as directed. Dx E11.9 300 each 2  . hydrochlorothiazide (HYDRODIURIL) 25 MG tablet TAKE 1 TABLET EVERY MORNING FOR BLOOD PRESSURE 90 tablet 1  . Insulin Glargine (LANTUS SOLOSTAR) 100 UNIT/ML Solostar Pen INJECT 25 UNITS SUBCUTANEOUSLY IN THE MORNING AND 25 UNITS IN THE EVENING. 45 mL 1  . Insulin Pen Needle 31G X 8 MM  MISC Use as directed to injection insulin. 300 each 2  . Lancets (ACCU-CHEK SOFT TOUCH) lancets Check blood sugar twice a day and as directed. Dx E11.9 300 each 2  . lisinopril (ZESTRIL) 10 MG tablet TAKE 1 TABLET EVERY DAY 90 tablet 1  . metFORMIN (GLUCOPHAGE) 500 MG tablet TAKE 2 TABLETS TWICE DAILY WITH MEALS 360 tablet 1  . simvastatin (ZOCOR) 20 MG tablet TAKE 1 TABLET BY MOUTH EVERY EVENING WITH SUPPER 90 tablet 1   No current facility-administered medications on file prior to visit.    BP 128/74   Pulse 80   Temp (!) 96.9 F (36.1 C) (Temporal)   Ht _0  (1.803 m)   Wt 235 lb 8 oz (106.8 kg)   SpO2 98%   BMI 32.85 kg/m    Objective:   Physical Exam  Constitutional: He appears well-nourished.  Cardiovascular: Normal rate and regular rhythm.  Respiratory: Effort normal and breath sounds normal.  Musculoskeletal:     Cervical back: Neck supple.  Skin: Skin is warm and dry.  Psychiatric: He has a normal mood and affect.           Assessment & Plan:

## 2019-11-02 NOTE — Assessment & Plan Note (Signed)
Secondary to herpes zoster. Increase gabapentin to 300 mg TID, new Rx sent to pharmacy.

## 2019-11-02 NOTE — Assessment & Plan Note (Signed)
Uncontrolled with little improvement since last visit. He's also not dividing his Lantus as previously discussed.  Suspect oral agents are no longer effective. Stop Actos. Continue other agents for now.   Change Lantus to 30 units in AM and 25 units in PM. Discussed to start checking blood sugars more frequently.  He will schedule eye exam. Foot exam UTD. Pneumonia vaccination UTD. Managed on statin and ACE.  Follow up in 3 months.

## 2019-11-08 ENCOUNTER — Other Ambulatory Visit: Payer: Self-pay | Admitting: Primary Care

## 2019-11-08 DIAGNOSIS — I10 Essential (primary) hypertension: Secondary | ICD-10-CM

## 2019-11-17 ENCOUNTER — Other Ambulatory Visit: Payer: Self-pay | Admitting: Primary Care

## 2020-01-05 ENCOUNTER — Telehealth: Payer: Self-pay | Admitting: Primary Care

## 2020-01-05 NOTE — Telephone Encounter (Signed)
Spoken and notified patient of Lawrence Parker comments. Patient verbalized understanding. Since Dr Paulla Fore is no longer with [facility], patient is schedule to see Dr Lorelei Pont on Thursday 01/06/2020

## 2020-01-05 NOTE — Telephone Encounter (Signed)
Patient's wife called today She stated that the patient fell a couple days ago and injured his knee. He has previously been referred to an orthopedic doctor. He stated his name is Dr Irene Shipper.. HE would prefer to go back and see him for his knee. I was unable to find a number for Dr Irene Shipper and looked into his referrals and could not find a referral or information   Do you happen to remember who you referred him to for orthopedics ?   Thanks!

## 2020-01-05 NOTE — Telephone Encounter (Signed)
He saw Dr. Paulla Fore in the past.  He can call over to [facility] to set up an appointment.  Otherwise he can see Dr. Lorelei Pont in our office.

## 2020-01-06 ENCOUNTER — Ambulatory Visit (INDEPENDENT_AMBULATORY_CARE_PROVIDER_SITE_OTHER): Payer: Medicare HMO | Admitting: Family Medicine

## 2020-01-06 ENCOUNTER — Encounter: Payer: Self-pay | Admitting: Family Medicine

## 2020-01-06 ENCOUNTER — Other Ambulatory Visit: Payer: Self-pay

## 2020-01-06 ENCOUNTER — Ambulatory Visit (INDEPENDENT_AMBULATORY_CARE_PROVIDER_SITE_OTHER)
Admission: RE | Admit: 2020-01-06 | Discharge: 2020-01-06 | Disposition: A | Discharge status: Home / Self Care | Payer: Medicare HMO | Source: Ambulatory Visit | Attending: Family Medicine | Admitting: Family Medicine

## 2020-01-06 VITALS — BP 140/70 | HR 75 | Temp 97.3°F | Ht 71.0 in | Wt 230.5 lb

## 2020-01-06 DIAGNOSIS — M25562 Pain in left knee: Secondary | ICD-10-CM

## 2020-01-06 DIAGNOSIS — M170 Bilateral primary osteoarthritis of knee: Secondary | ICD-10-CM

## 2020-01-06 DIAGNOSIS — W108XXA Fall (on) (from) other stairs and steps, initial encounter: Secondary | ICD-10-CM

## 2020-01-06 DIAGNOSIS — M25462 Effusion, left knee: Secondary | ICD-10-CM | POA: Diagnosis not present

## 2020-01-06 DIAGNOSIS — S8992XA Unspecified injury of left lower leg, initial encounter: Secondary | ICD-10-CM | POA: Diagnosis not present

## 2020-01-06 DIAGNOSIS — M1120 Other chondrocalcinosis, unspecified site: Secondary | ICD-10-CM | POA: Diagnosis not present

## 2020-01-06 NOTE — Progress Notes (Signed)
Lawrence Flaim T. Fumi Guadron, MD, [facility] Sports Medicine [facility] at [facility] [place], [address]  Phone: [phone]  FAX: [phone]  Lawrence Parker - 74 y.o. male  MRN [id number]  Date of Birth: Sep 19, 1945  Date: 01/06/2020  PCP: Pleas Koch, NP  Referral: Pleas Koch, NP  Chief Complaint  Patient presents with  . Fall    On Monday while uncovering pool  . Knee Pain    This visit occurred during the SARS-CoV-2 public health emergency.  Safety protocols were in place, including screening questions prior to the visit, additional usage of staff PPE, and extensive cleaning of exam room while observing appropriate contact time as indicated for disinfecting solutions.   Subjective:   Lawrence Parker is a 74 y.o. very pleasant male patient with Body mass index is 32.15 kg/m. who presents with the following:  4 days ago the patient was trying to get his pool ready for the spring and he fell and struck his knee directly on another stair.  He has had some significant swelling.  Since then he has been limping, but he has not needed any form of an assistive device.  He does have a significant abrasion anteriorly.  He does have a known history of her osteoarthritis.  Prior to this is not had any other recent traumas.  He has had his knee injected in the past with both corticosteroid and viscosupplementation.  4 d ago ws going to uncover it, and stepped into the steps.  Hit is knee.  Review of Systems is noted in the HPI, as appropriate   Objective:   BP 140/70   Pulse 75   Temp (!) 97.3 F (36.3 C) (Temporal)   Ht _0  (1.803 m)   Wt 230 lb 8 oz (104.6 kg)   SpO2 97%   BMI 32.15 kg/m   GEN: No acute distress; alert,appropriate. PULM: Breathing comfortably in no respiratory distress PSYCH: Normally interactive.   Left knee: Lacks 4 degrees of extension.  Flexion to 105 degrees.  Moderate  ballotable effusion.  Stable to varus and valgus stress.  Nontender at the tibia, fibula, distal femur, as well as the patella.  Patellar and quad tendons are intact.  Additional testing limited secondary to lack of flexion and extension due to effusion.  Radiology: DG Knee Complete 4 Views Left  Result Date: 01/06/2020 CLINICAL DATA:  Fall on stairs, direct trauma. EXAM: LEFT KNEE - COMPLETE 4+ VIEW COMPARISON:  Knee radiograph 11/29/2016 FINDINGS: No acute fracture or dislocation. Moderate tricompartmental osteoarthritis with peripheral spurring. Medial tibiofemoral joint space narrowing. There is bilateral tibiofemoral chondrocalcinosis. Moderate knee joint effusion but no lipohemarthrosis. Vascular calcifications. IMPRESSION: 1. No acute fracture or dislocation. 2. Moderate tricompartmental osteoarthritis, most prominent in the medial tibiofemoral compartment. Chondrocalcinosis. 3. Moderate knee joint effusion without lipohemarthrosis. Electronically Signed   By: Keith Rake M.D.   On: 01/06/2020 20:19     Assessment and Plan:     ICD-10-CM   1. Acute pain of left knee  M25.562 DG Knee Complete 4 Views Left  2. Fall (on) (from) other stairs and steps, initial encounter  W10.8XXA DG Knee Complete 4 Views Left  3. Primary osteoarthritis of knees, bilateral  M17.0    Acute pain secondary to direct trauma of his knee, anterior.  Rest, ice, anti-inflammatories, Tylenol.  His symptoms persist then I am happy to reevaluate him in 1 month.  Very significant osteoarthritis, tricompartmental.  Notable chondrocalcinosis, but he did have a prior knee aspirate which did not show any crystals.  Attempted to pull up and over read the  Follow-up: No follow-ups on file.  No orders of the defined types were placed in this encounter.  There are no discontinued medications. Orders Placed This Encounter  Procedures  . DG Knee Complete 4 Views Left    Signed,  Duaa Stelzner T. Dartha Rozzell,  MD   Outpatient Encounter Medications as of 01/06/2020  Medication Sig  . ACCU-CHEK FASTCLIX LANCETS MISC Check blood sugar twice a day and as directed. Dx E11.9  . Alcohol Swabs (B-D SINGLE USE SWABS REGULAR) PADS Use as needed to check blood sugar daily.  Marland Kitchen amLODipine (NORVASC) 10 MG tablet TAKE 1 TABLET EVERY DAY  . aspirin 325 MG tablet Take 325 mg by mouth daily.  . Blood Glucose Calibration (ACCU-CHEK AVIVA) SOLN Use as instructed.  . Blood Glucose Monitoring Suppl (ACCU-CHEK AVIVA PLUS) w/Device KIT Check blood sugar twice a day and as directed. Dx E11.9  . fluticasone (FLONASE) 50 MCG/ACT nasal spray USE 2 SPRAYS IN EACH NOSTRIL EVERY DAY  . gabapentin (NEURONTIN) 300 MG capsule Take 1 capsule (300 mg total) by mouth 3 (three) times daily.  Marland Kitchen glipiZIDE (GLUCOTROL) 10 MG tablet TAKE 1 TABLET BY MOUTH TWICE A DAY  . glucose blood (ACCU-CHEK AVIVA PLUS) test strip USE AS DIRECTED TO TEST BLOOD SUGAR TWICE DAILY  . hydrochlorothiazide (HYDRODIURIL) 25 MG tablet TAKE 1 TABLET EVERY MORNING FOR BLOOD PRESSURE  . Insulin Glargine (LANTUS SOLOSTAR) 100 UNIT/ML Solostar Pen INJECT 30 UNITS SUBCUTANEOUSLY IN THE MORNING AND 25 UNITS IN THE EVENING.  . Insulin Pen Needle 31G X 8 MM MISC Use as directed to injection insulin.  . Lancets (ACCU-CHEK SOFT TOUCH) lancets Check blood sugar twice a day and as directed. Dx E11.9  . lisinopril (ZESTRIL) 10 MG tablet TAKE 1 TABLET EVERY DAY  . metFORMIN (GLUCOPHAGE) 500 MG tablet TAKE 2 TABLETS TWICE DAILY WITH MEALS  . simvastatin (ZOCOR) 20 MG tablet TAKE 1 TABLET BY MOUTH EVERY EVENING WITH SUPPER   No facility-administered encounter medications on file as of 01/06/2020.

## 2020-01-07 ENCOUNTER — Encounter: Payer: Self-pay | Admitting: Family Medicine

## 2020-01-07 ENCOUNTER — Other Ambulatory Visit: Payer: Self-pay | Admitting: Primary Care

## 2020-01-07 DIAGNOSIS — E118 Type 2 diabetes mellitus with unspecified complications: Secondary | ICD-10-CM

## 2020-01-07 DIAGNOSIS — Z794 Long term (current) use of insulin: Secondary | ICD-10-CM

## 2020-01-26 ENCOUNTER — Other Ambulatory Visit: Payer: Self-pay | Admitting: Primary Care

## 2020-01-31 ENCOUNTER — Ambulatory Visit (INDEPENDENT_AMBULATORY_CARE_PROVIDER_SITE_OTHER): Payer: Medicare HMO | Admitting: Primary Care

## 2020-01-31 ENCOUNTER — Encounter: Payer: Self-pay | Admitting: Primary Care

## 2020-01-31 ENCOUNTER — Other Ambulatory Visit: Payer: Self-pay

## 2020-01-31 VITALS — BP 126/60 | HR 71 | Temp 97.4°F | Ht 71.0 in | Wt 223.0 lb

## 2020-01-31 DIAGNOSIS — E118 Type 2 diabetes mellitus with unspecified complications: Secondary | ICD-10-CM

## 2020-01-31 DIAGNOSIS — Z794 Long term (current) use of insulin: Secondary | ICD-10-CM | POA: Diagnosis not present

## 2020-01-31 DIAGNOSIS — E119 Type 2 diabetes mellitus without complications: Secondary | ICD-10-CM

## 2020-01-31 LAB — POCT GLYCOSYLATED HEMOGLOBIN (HGB A1C): Hemoglobin A1C: 10.7 % — AB (ref 4.0–5.6)

## 2020-01-31 MED ORDER — LANTUS SOLOSTAR 100 UNIT/ML ~~LOC~~ SOPN: PEN_INJECTOR | SUBCUTANEOUS | 1 refills | Status: DC

## 2020-01-31 NOTE — Patient Instructions (Signed)
We've increased the dose of your Lantus to 35 units in the morning and kept 25 units in the evening.  Start exercising. You should be getting 150 minutes of moderate intensity exercise weekly.  It is important that you improve your diet. Please limit carbohydrates in the form of white bread, rice, pasta, sweets, fast food, fried food, sugary drinks, etc. Increase your consumption of fresh fruits and vegetables, whole grains, lean protein.  Ensure you are consuming 64 ounces of water daily.  You will be contacted regarding your referral to the diabetic nutritionist.  Please let us know if you have not been contacted within two weeks.   Please schedule a follow up appointment in 3 months for diabetes check.  It was a pleasure to see you today!   Diabetes Mellitus and Nutrition, Adult When you have diabetes (diabetes mellitus), it is very important to have healthy eating habits because your blood sugar (glucose) levels are greatly affected by what you eat and drink. Eating healthy foods in the appropriate amounts, at about the same times every day, can help you:  Control your blood glucose.  Lower your risk of heart disease.  Improve your blood pressure.  Reach or maintain a healthy weight. Every person with diabetes is different, and each person has different needs for a meal plan. Your health care provider may recommend that you work with a diet and nutrition specialist (dietitian) to make a meal plan that is best for you. Your meal plan may vary depending on factors such as:  The calories you need.  The medicines you take.  Your weight.  Your blood glucose, blood pressure, and cholesterol levels.  Your activity level.  Other health conditions you have, such as heart or kidney disease. How do carbohydrates affect me? Carbohydrates, also called carbs, affect your blood glucose level more than any other type of food. Eating carbs naturally raises the amount of glucose in your  blood. Carb counting is a method for keeping track of how many carbs you eat. Counting carbs is important to keep your blood glucose at a healthy level, especially if you use insulin or take certain oral diabetes medicines. It is important to know how many carbs you can safely have in each meal. This is different for every person. Your dietitian can help you calculate how many carbs you should have at each meal and for each snack. Foods that contain carbs include:  Bread, cereal, rice, pasta, and crackers.  Potatoes and corn.  Peas, beans, and lentils.  Milk and yogurt.  Fruit and juice.  Desserts, such as cakes, cookies, ice cream, and candy. How does alcohol affect me? Alcohol can cause a sudden decrease in blood glucose (hypoglycemia), especially if you use insulin or take certain oral diabetes medicines. Hypoglycemia can be a life-threatening condition. Symptoms of hypoglycemia (sleepiness, dizziness, and confusion) are similar to symptoms of having too much alcohol. If your health care provider says that alcohol is safe for you, follow these guidelines:  Limit alcohol intake to no more than 1 drink per day for nonpregnant women and 2 drinks per day for men. One drink equals 12 oz of beer, 5 oz of wine, or 1 oz of hard liquor.  Do not drink on an empty stomach.  Keep yourself hydrated with water, diet soda, or unsweetened iced tea.  Keep in mind that regular soda, juice, and other mixers may contain a lot of sugar and must be counted as carbs. What are tips for  following this plan?  Reading food labels  Start by checking the serving size on the "Nutrition Facts" label of packaged foods and drinks. The amount of calories, carbs, fats, and other nutrients listed on the label is based on one serving of the item. Many items contain more than one serving per package.  Check the total grams (g) of carbs in one serving. You can calculate the number of servings of carbs in one serving by  dividing the total carbs by 15. For example, if a food has 30 g of total carbs, it would be equal to 2 servings of carbs.  Check the number of grams (g) of saturated and trans fats in one serving. Choose foods that have low or no amount of these fats.  Check the number of milligrams (mg) of salt (sodium) in one serving. Most people should limit total sodium intake to less than 2,300 mg per day.  Always check the nutrition information of foods labeled as "low-fat" or "nonfat". These foods may be higher in added sugar or refined carbs and should be avoided.  Talk to your dietitian to identify your daily goals for nutrients listed on the label. Shopping  Avoid buying canned, premade, or processed foods. These foods tend to be high in fat, sodium, and added sugar.  Shop around the outside edge of the grocery store. This includes fresh fruits and vegetables, bulk grains, fresh meats, and fresh dairy. Cooking  Use low-heat cooking methods, such as baking, instead of high-heat cooking methods like deep frying.  Cook using healthy oils, such as olive, canola, or sunflower oil.  Avoid cooking with butter, cream, or high-fat meats. Meal planning  Eat meals and snacks regularly, preferably at the same times every day. Avoid going long periods of time without eating.  Eat foods high in fiber, such as fresh fruits, vegetables, beans, and whole grains. Talk to your dietitian about how many servings of carbs you can eat at each meal.  Eat 4-6 ounces (oz) of lean protein each day, such as lean meat, chicken, fish, eggs, or tofu. One oz of lean protein is equal to: ? 1 oz of meat, chicken, or fish. ? 1 egg. ?  cup of tofu.  Eat some foods each day that contain healthy fats, such as avocado, nuts, seeds, and fish. Lifestyle  Check your blood glucose regularly.  Exercise regularly as told by your health care provider. This may include: ? 150 minutes of moderate-intensity or vigorous-intensity  exercise each week. This could be brisk walking, biking, or water aerobics. ? Stretching and doing strength exercises, such as yoga or weightlifting, at least 2 times a week.  Take medicines as told by your health care provider.  Do not use any products that contain nicotine or tobacco, such as cigarettes and e-cigarettes. If you need help quitting, ask your health care provider.  Work with a Social worker or diabetes educator to identify strategies to manage stress and any emotional and social challenges. Questions to ask a health care provider  Do I need to meet with a diabetes educator?  Do I need to meet with a dietitian?  What number can I call if I have questions?  When are the best times to check my blood glucose? Where to find more information:  American Diabetes Association: diabetes.org  Academy of Nutrition and Dietetics: www.eatright.[employer] of Diabetes and Digestive and Kidney Diseases (NIH): [employer] Summary  A healthy meal plan will help you control your blood glucose  and maintain a healthy lifestyle.  Working with a diet and nutrition specialist (dietitian) can help you make a meal plan that is best for you.  Keep in mind that carbohydrates (carbs) and alcohol have immediate effects on your blood glucose levels. It is important to count carbs and to use alcohol carefully. This information is not intended to replace advice given to you by your health care provider. Make sure you discuss any questions you have with your health care provider. Document Revised: 08/15/2017 Document Reviewed: 10/07/2016 Elsevier Patient Education  2020 [employer].

## 2020-01-31 NOTE — Assessment & Plan Note (Signed)
Uncontrolled with A1C of 10.7. Suspect this is secondary to sedentary lifestyle coupled with decrease in pancreatic function.  Strongly advised he work on exercise and a healthy diet. Referral to diabetic nutritionist placed. Increase Lantus to 35 units in the AM, continue 25 units in PM.  Strongly advised he check glucose readings at least twice daily, he verbalized understanding.  Follow up in 3 months. He will notify sooner if glucose readings are consistently at or above 200.

## 2020-01-31 NOTE — Progress Notes (Signed)
Subjective:    Patient ID: Lawrence Parker, male    DOB: 1946/07/15, 75 y.o.   MRN: [id number]  HPI  This visit occurred during the SARS-CoV-2 public health emergency.  Safety protocols were in place, including screening questions prior to the visit, additional usage of staff PPE, and extensive cleaning of exam room while observing appropriate contact time as indicated for disinfecting solutions.   Lawrence Parker is a 74 year old male with a history of hypertension, PAD, type 2 diabetes, hyperlipidemia who presents today for follow up of diabetes.  Current medications include: Glipizide 10 mg BID, Metformin 1000 mg BID, Lantus 30 units in the morning and 25 units in the evening.   He is actually injecting Lantus 27 units twice daily.   He is checking his blood glucose 1 times daily and is getting readings of:  AM fasting: 125-130 Bedtime: 125-130  Highest reading 150  Last A1C: 8.5 in February 2021, 10.7 in May 2021 Last Eye Exam: Declines and will not schedule due to Covid-19 Last Foot Exam: UTD Pneumonia Vaccination: Completed in 2017 ACE/ARB: Lisinopril  Statin: Simvastatin   BP Readings from Last 3 Encounters:  01/31/20 126/60  01/06/20 140/70  11/02/19 128/74    Wt Readings from Last 3 Encounters:  01/31/20 223 lb (101.2 kg)  01/06/20 230 lb 8 oz (104.6 kg)  11/02/19 235 lb 8 oz (106.8 kg)   Diet currently consists of:  Breakfast: Skips sometimes, oatmeal, eggs Lunch: Vegetables at restaurants, mac and cheese Dinner: Vegetables Snacks: He denies Desserts: Denies Beverages: Water  Exercise: He is not exercising.    Review of Systems  Eyes: Negative for visual disturbance.  Respiratory: Negative for shortness of breath.   Cardiovascular: Negative for chest pain.  Neurological: Negative for dizziness and headaches.       Past Medical History:  Diagnosis Date  . Anxiety    takes Xanax prn  . Arthritis    neck  . Back pain    deteriorating disc  .  Bladder neck obstruction   . Colon polyps   . Depression    hx of but doesn't take any medications  . Diabetes mellitus    takes Actos,Glipizide,Metformin,and Lantus  . Diverticula of colon   . History of kidney stones    also has one at present time   . Hyperlipidemia    takes Zocor daily  . Hypertension    takes Amlodipine daily and HCTZ daily  . Neck pain    cervical spondylosis  . Peyronie disease      Social History   Socioeconomic History  . Marital status: Widowed    Spouse name: Not on file  . Number of children: Not on file  . Years of education: Not on file  . Highest education level: Not on file  Occupational History  . Occupation: retired  Tobacco Use  . Smoking status: Former Smoker    Types: Cigarettes  . Smokeless tobacco: Never Used  . Tobacco comment: quit smoking 33yr ago  Substance and Sexual Activity  . Alcohol use: No  . Drug use: No  . Sexual activity: Not Currently  Other Topics Concern  . Not on file  Social History Narrative   Married.   2 children. 2 grandchildren.   Retired. Drove trucks for 43 years.   Enjoys traveling, spending time with family.   Social Determinants of Health   Financial Resource Strain: Low Risk   . Difficulty of Paying Living Expenses: Not  hard at all  Food Insecurity: No Food Insecurity  . Worried About Charity fundraiser in the Last Year: Never true  . Ran Out of Food in the Last Year: Never true  Transportation Needs: No Transportation Needs  . Lack of Transportation (Medical): No  . Lack of Transportation (Non-Medical): No  Physical Activity: Inactive  . Days of Exercise per Week: 0 days  . Minutes of Exercise per Session: 0 min  Stress: No Stress Concern Present  . Feeling of Stress : Not at all  Social Connections:   . Frequency of Communication with Friends and Family:   . Frequency of Social Gatherings with Friends and Family:   . Attends Religious Services:   . Active Member of Clubs or  Organizations:   . Attends Archivist Meetings:   Marland Kitchen Marital Status:   Intimate Partner Violence: Not At Risk  . Fear of Current or Ex-Partner: No  . Emotionally Abused: No  . Physically Abused: No  . Sexually Abused: No    Past Surgical History:  Procedure Laterality Date  . CERVICAL SPINE SURGERY    . CIRCUMCISION  20+yrs ago  . COLONOSCOPY    . NOSE SURGERY  20+yrs ago  . SHOULDER SURGERY     right 20+yrs ago    Family History  Problem Relation Age of Onset  . Cancer Mother   . Cancer Father   . Cancer Sister   . Diabetes Sister   . Cancer Sister   . Anesthesia problems Neg Hx   . Hypotension Neg Hx   . Malignant hyperthermia Neg Hx   . Pseudochol deficiency Neg Hx     No Known Allergies  Current Outpatient Medications on File Prior to Visit  Medication Sig Dispense Refill  . ACCU-CHEK FASTCLIX LANCETS MISC Check blood sugar twice a day and as directed. Dx E11.9 300 each 1  . Alcohol Swabs (B-D SINGLE USE SWABS REGULAR) PADS Use as needed to check blood sugar daily. 300 each 5  . amLODipine (NORVASC) 10 MG tablet TAKE 1 TABLET EVERY DAY 90 tablet 1  . aspirin 325 MG tablet Take 325 mg by mouth daily.    . Blood Glucose Calibration (ACCU-CHEK AVIVA) SOLN Use as instructed. 1 each 1  . Blood Glucose Monitoring Suppl (ACCU-CHEK AVIVA PLUS) w/Device KIT Check blood sugar twice a day and as directed. Dx E11.9 1 kit 0  . fluticasone (FLONASE) 50 MCG/ACT nasal spray USE 2 SPRAYS IN EACH NOSTRIL EVERY DAY 48 g 1  . gabapentin (NEURONTIN) 300 MG capsule Take 1 capsule (300 mg total) by mouth 3 (three) times daily. 270 capsule 0  . glipiZIDE (GLUCOTROL) 10 MG tablet TAKE 1 TABLET BY MOUTH TWICE A DAY 60 tablet 0  . glucose blood (ACCU-CHEK AVIVA PLUS) test strip USE AS DIRECTED TO TEST BLOOD SUGAR TWICE DAILY 300 strip 2  . hydrochlorothiazide (HYDRODIURIL) 25 MG tablet TAKE 1 TABLET EVERY MORNING FOR BLOOD PRESSURE 90 tablet 1  . Insulin Pen Needle 31G X 8 MM MISC  Use as directed to injection insulin. 300 each 2  . Lancets (ACCU-CHEK SOFT TOUCH) lancets Check blood sugar twice a day and as directed. Dx E11.9 300 each 2  . LANTUS SOLOSTAR 100 UNIT/ML Solostar Pen INJECT 30 UNITS SUBCUTANEOUSLY IN THE MORNING AND 25 UNITS IN THE EVENING. 45 mL 1  . lisinopril (ZESTRIL) 10 MG tablet TAKE 1 TABLET EVERY DAY 90 tablet 1  . metFORMIN (GLUCOPHAGE) 500 MG tablet TAKE  2 TABLETS TWICE DAILY WITH MEALS 360 tablet 1  . simvastatin (ZOCOR) 20 MG tablet Take 1 tablet (20 mg total) by mouth daily at 6 PM. 90 tablet 3   No current facility-administered medications on file prior to visit.    BP 126/60 (BP Location: Left Arm, Patient Position: Sitting, Cuff Size: Large)   Pulse 71   Temp (!) 97.4 F (36.3 C)   Ht _0  (1.803 m)   Wt 223 lb (101.2 kg)   SpO2 97%   BMI 31.10 kg/m    Objective:   Physical Exam  Constitutional: He appears well-nourished.  Cardiovascular: Normal rate and regular rhythm.  Respiratory: Effort normal and breath sounds normal.  Musculoskeletal:     Cervical back: Neck supple.  Skin: Skin is warm and dry.  Psychiatric: He has a normal mood and affect.           Assessment & Plan:

## 2020-02-15 ENCOUNTER — Encounter: Payer: Self-pay | Admitting: *Deleted

## 2020-02-15 ENCOUNTER — Other Ambulatory Visit: Payer: Self-pay

## 2020-02-15 ENCOUNTER — Encounter: Payer: Medicare HMO | Attending: Primary Care | Admitting: *Deleted

## 2020-02-15 VITALS — BP 130/60 | Ht 72.0 in | Wt 226.4 lb

## 2020-02-15 DIAGNOSIS — E119 Type 2 diabetes mellitus without complications: Secondary | ICD-10-CM | POA: Insufficient documentation

## 2020-02-15 DIAGNOSIS — Z713 Dietary counseling and surveillance: Secondary | ICD-10-CM | POA: Insufficient documentation

## 2020-02-15 DIAGNOSIS — Z794 Long term (current) use of insulin: Secondary | ICD-10-CM | POA: Diagnosis not present

## 2020-02-15 NOTE — Patient Instructions (Signed)
Check blood sugars 2 x day before breakfast and 2 hrs after supper every day Bring blood sugar records to MD appointment  Exercise:  Walk as tolerated  Eat 3 meals day,  1-2  snacks a day Space meals 4-6 hours apart  Make an eye doctor appointment  Carry fast acting glucose and a snack at all times Rotate injection sites Hold insulin pen in place for 5-10 seconds after injection Change pen needle at least daily  Call back for any questions

## 2020-02-15 NOTE — Progress Notes (Signed)
Diabetes Self-Management Education  Visit Type: First/Initial  Appt. Start Time: 0845 Appt. End Time: 5277  02/15/2020  [name], identified by name and date of birth, is a 74 y.o. male with a diagnosis of Diabetes: Type 2.   ASSESSMENT  Blood pressure 130/60, height 6' (1.829 m), weight 226 lb 6.4 oz (102.7 kg). Body mass index is 30.71 kg/m.  Diabetes Self-Management Education - 02/15/20 1214      Visit Information   Visit Type  First/Initial      Initial Visit   Diabetes Type  Type 2    Are you currently following a meal plan?  No    Are you taking your medications as prescribed?  Yes    Date Diagnosed  1990's      Health Coping   How would you rate your overall health?  Fair      Psychosocial Assessment   Patient Belief/Attitude about Diabetes  Other (comment)   "it doesn't bother mer"   Self-care barriers  None    Self-management support  Doctor's office    Patient Concerns  Nutrition/Meal planning;Weight Control;Monitoring;Glycemic Control    Special Needs  None    Preferred Learning Style  Hands on    Learning Readiness  Contemplating    How often do you need to have someone help you when you read instructions, pamphlets, or other written materials from your doctor or pharmacy?  3 - Sometimes   this nurse had to complete his paperwork during appt   What is the last grade level you completed in school?  9th      Pre-Education Assessment   Patient understands the diabetes disease and treatment process.  Needs Instruction    Patient understands incorporating nutritional management into lifestyle.  Needs Instruction    Patient undertands incorporating physical activity into lifestyle.  Needs Instruction    Patient understands using medications safely.  Needs Review    Patient understands monitoring blood glucose, interpreting and using results  Needs Review    Patient understands prevention, detection, and treatment of acute complications.  Needs  Instruction    Patient understands prevention, detection, and treatment of chronic complications.  Needs Review    Patient understands how to develop strategies to address psychosocial issues.  Needs Instruction    Patient understands how to develop strategies to promote health/change behavior.  Needs Instruction      Complications   Last HgB A1C per patient/outside source  10.7 %   01/31/2020   How often do you check your blood sugar?  3-4 times / week    Fasting Blood glucose range (mg/dL)  130-179;>200   Pt reports FBG 159 mg/dL today and 260 mg/dL yesterday.   Have you had a dilated eye exam in the past 12 months?  No    Have you had a dental exam in the past 12 months?  No    Are you checking your feet?  No      Dietary Intake   Breakfast  oatmeal or eggs, grits and 1/2 biscuit    Lunch  green breans, cabbage, mac-n-cheese, hamburger, chicken sandwich    Snack (afternoon)  sometimes he has an apple or peanut butter crackers    Dinner  chicken sandwich, potaotes, peas, beans, corn, greens, okra, occasional broccoli; salads with tomatoes, cuccumbers, carrots    Beverage(s)  water, unsweet tea      Exercise   Exercise Type  ADL's      Patient Education  Previous Diabetes Education  No   pt reports he may have come for 1 visit many years ago   Disease state   Definition of diabetes, type 1 and 2, and the diagnosis of diabetes;Factors that contribute to the development of diabetes    Nutrition management   Role of diet in the treatment of diabetes and the relationship between the three main macronutrients and blood glucose level;Food label reading, portion sizes and measuring food.;Reviewed blood glucose goals for pre and post meals and how to evaluate the patients' food intake on their blood glucose level.;Meal timing in regards to the patients' current diabetes medication.    Physical activity and exercise   Role of exercise on diabetes management, blood pressure control and cardiac  health.    Medications  Taught/reviewed insulin injection, site rotation, insulin storage and needle disposal.;Reviewed patients medication for diabetes, action, purpose, timing of dose and side effects.   pt doesn't rotate injection sites, changes his pen needle every few days and doesn't hold in place for 5-10 seconds after injection   Monitoring  Purpose and frequency of SMBG.;Taught/discussed recording of test results and interpretation of SMBG.;Identified appropriate SMBG and/or A1C goals.    Acute complications  Taught treatment of hypoglycemia - the 15 rule.   he doesn't carry fast acting glucose with him   Chronic complications  Relationship between chronic complications and blood glucose control    Psychosocial adjustment  Identified and addressed patients feelings and concerns about diabetes      Individualized Goals (developed by patient)   Reducing Risk  Other (comment)   improve blood sugars, prevent diabetes complications, lose weight     Outcomes   Expected Outcomes  Demonstrated limited interest in learning.  Expect minimal changes   Patient directed conversation away to other subjects during visit.   Program Status  Not Completed       Individualized Plan for Diabetes Self-Management Training:   Learning Objective:  Patient will have a greater understanding of diabetes self-management. Patient education plan is to attend individual and/or group sessions per assessed needs and concerns.   Plan:   Patient Instructions  Check blood sugars 2 x day before breakfast and 2 hrs after supper every day Bring blood sugar records to MD appointment Exercise:  Walk as tolerated Eat 3 meals day,  1-2  snacks a day Space meals 4-6 hours apart Make an eye doctor appointment Carry fast acting glucose and a snack at all times Rotate injection sites Hold insulin pen in place for 5-10 seconds after injection Change pen needle at least daily Call back for any questions  Expected  Outcomes:  Demonstrated limited interest in learning.  Expect minimal changes(Patient directed conversation away to other subjects during visit.)  Education material provided:  General Meal Planning Guidelines Simple Meal Plan Glucose tablets Symptoms, causes and treatments of Hypoglycemia  If problems or questions, patient to contact team via:  Johny Drilling, RN, CCM, [place] [phone]  Future DSME appointment:  The patient didn't want to return for further education at this time. He was offered Diabetes classes or an additional appointment with the dietitian.

## 2020-02-24 ENCOUNTER — Telehealth: Payer: Self-pay | Admitting: *Deleted

## 2020-02-24 DIAGNOSIS — E119 Type 2 diabetes mellitus without complications: Secondary | ICD-10-CM

## 2020-02-24 MED ORDER — GLIPIZIDE 10 MG PO TABS: 10.0000 mg | ORAL_TABLET | Freq: Two times a day (BID) | ORAL | 3 refills | Status: DC

## 2020-02-24 NOTE — Telephone Encounter (Signed)
Pt's wife left VM at Triage. Pt wants to know if he should still be taking glipizide. They see the med was on pt's med list on last AVS but when they got home they couldn't find a bottle for med. Last time it was filled on chart was 08/26/18. Wife said if pt is suppose to be taking med then please send new Rx to [facility] order pharmacy because they can't "locate" this med  Wife request [place] to call pt back with answer at [facility] # [phone]

## 2020-02-24 NOTE — Telephone Encounter (Signed)
Message left for patient to return my call.  

## 2020-02-24 NOTE — Telephone Encounter (Signed)
Please notify patient and his wife that he SHOULD be taking Glipizide twice daily. I'll send a new prescription to [place] as instructed.

## 2020-02-25 NOTE — Telephone Encounter (Signed)
Spoken and notified patient of [name]'s comments. Verbalized understanding. ° °

## 2020-02-26 ENCOUNTER — Other Ambulatory Visit: Payer: Self-pay | Admitting: Primary Care

## 2020-02-26 DIAGNOSIS — J302 Other seasonal allergic rhinitis: Secondary | ICD-10-CM

## 2020-03-06 ENCOUNTER — Other Ambulatory Visit: Payer: Self-pay | Admitting: Primary Care

## 2020-03-06 DIAGNOSIS — E119 Type 2 diabetes mellitus without complications: Secondary | ICD-10-CM

## 2020-03-06 DIAGNOSIS — Z794 Long term (current) use of insulin: Secondary | ICD-10-CM

## 2020-03-22 ENCOUNTER — Other Ambulatory Visit: Payer: Self-pay | Admitting: Primary Care

## 2020-03-22 DIAGNOSIS — I10 Essential (primary) hypertension: Secondary | ICD-10-CM

## 2020-03-27 ENCOUNTER — Other Ambulatory Visit: Payer: Self-pay | Admitting: Primary Care

## 2020-03-27 DIAGNOSIS — I10 Essential (primary) hypertension: Secondary | ICD-10-CM

## 2020-03-28 ENCOUNTER — Other Ambulatory Visit: Payer: Self-pay | Admitting: Primary Care

## 2020-03-28 DIAGNOSIS — M792 Neuralgia and neuritis, unspecified: Secondary | ICD-10-CM

## 2020-03-29 NOTE — Telephone Encounter (Signed)
Last prescribed on 11/02/2019 Last OV (follow up ) with Allie Bossier on 01/31/2020 Future OV scheduled on 05/02/2020

## 2020-03-29 NOTE — Telephone Encounter (Signed)
Refills sent to pharmacy. 

## 2020-04-20 DIAGNOSIS — M25562 Pain in left knee: Secondary | ICD-10-CM | POA: Diagnosis not present

## 2020-04-20 DIAGNOSIS — M1711 Unilateral primary osteoarthritis, right knee: Secondary | ICD-10-CM | POA: Diagnosis not present

## 2020-04-20 DIAGNOSIS — M25462 Effusion, left knee: Secondary | ICD-10-CM | POA: Diagnosis not present

## 2020-04-20 DIAGNOSIS — M1712 Unilateral primary osteoarthritis, left knee: Secondary | ICD-10-CM | POA: Diagnosis not present

## 2020-04-28 ENCOUNTER — Telehealth: Payer: Self-pay | Admitting: Primary Care

## 2020-04-28 NOTE — Telephone Encounter (Signed)
[facility] - Client TELEPHONE ADVICE RECORD [facility] Patient Name: Lawrence Parker Gender: Male DOB: 1945/09/19 Age: 74 Y 3 M 23 D Return Phone Number: [phone] (Primary) [place] [address] Client Lawrence Parker - Client Client Site [place] [facility] Physician Lawrence Parker - NP Contact Type Call Who Is Calling Patient / Member / Family / Caregiver Call Type Triage / Clinical Relationship To Patient Self Return Phone Number [phone] (Primary) Chief Complaint BREATHING - shortness of breath or sounds breathless Reason for Call Symptomatic / Request for [place] states that he tested positive for COVID and he has a bad cough and shortness of breath. Translation No Nurse Assessment Nurse: Lawrence Curt, RN, Lawrence Parker Date/Time (Eastern Time): 04/28/2020 12:09:31 PM Confirm and document reason for call. If symptomatic, describe symptoms. ---Caller states that he tested positive for COVID this morning at [facility] and he has a bad cough and "little bit" shortness of breath with activity. Has the patient had close contact with a person known or suspected to have the novel coronavirus illness OR traveled / lives in area with major community spread (including international travel) in the last 14 days from the onset of symptoms? * If Asymptomatic, screen for exposure and travel within the last 14 days. ---Yes Does the patient have any new or worsening symptoms? ---Yes Will a triage be completed? ---Yes Related visit to physician within the last 2 weeks? ---No Does the PT have any chronic conditions? (i.e. diabetes, asthma, this includes High risk factors for pregnancy, etc.) ---Yes List chronic conditions. ---Diabetes - 8/18 appt. Is this a behavioral health or substance abuse call? ---No Guidelines Guideline Title Affirmed Question Affirmed Notes  Nurse Date/Time (Eastern Time) COVID-19 - Diagnosed or Suspected [1] HIGH RISK patient (e.g., age > 6 years, diabetes, heart or lung disease, weak immune system) AND [2] new or worsening symptoms Lawrence Curt, RN, Lawrence Parker 04/28/2020 12:14:54 PM PLEASE NOTE: All timestamps contained within this report are represented as [place] Standard Time. CONFIDENTIALTY NOTICE: This fax transmission is intended only for the addressee. It contains information that is legally privileged, confidential or otherwise protected from use or disclosure. If you are not the intended recipient, you are strictly prohibited from reviewing, disclosing, copying using or disseminating any of this information or taking any action in reliance on or regarding this information. If you have received this fax in error, please notify us immediately by telephone so that we can arrange for its return to [place]. Phone: [phone], Toll-Free: [phone], Fax: [phone] Page: 2 of 2 Call Id: [id number] Lawrence Parker. Time Lawrence Parker Time) Disposition Final User 04/28/2020 12:05:02 PM Send to Urgent Queue Lawrence Parker 04/28/2020 12:31:27 PM Send To RN Personal Lawrence Curt, RN, Lawrence Parker 04/28/2020 12:29:42 PM Call PCP Now Yes Lawrence Curt, RN, Lawrence Parker Disagree/Comply Comply Caller Understands Yes PreDisposition Call Doctor Care Advice Given Per Guideline CALL PCP NOW: * You need to discuss this with your doctor (or NP/PA). CAUTION - DEXTROMETHORPHAN: * Do not try to completely suppress coughs that produce mucus and phlegm. Remember that coughing is helpful in bringing up mucus from the lungs and preventing pneumonia. CALL BACK IF: * You become worse. CARE ADVICE given per COVID-19 - DIAGNOSED OR SUSPECTED (Adult) guideline. * HOME REMEDY - HARD CANDY: Hard candy works just as well as medicine-flavored OTC cough drops. People who have diabetes should use sugar-free candy. * I'll page the on-call provider now. If you haven't heard from  the provider  (or me) within 30 minutes, call again. Comments User: Lawrence Obey, RN Date/Time Lawrence Parker Time): 04/28/2020 1:12:51 PM Called office, will send over report for a nurse to look over. Referrals Warm transfer to backline

## 2020-04-28 NOTE — Telephone Encounter (Signed)
Pt got a positive covid test from [facility] test this morning;04/28/20. Pt was exposed to pts son who was dx on 04/25/20 with + covid. Pt cannot ck temp now but feeling forehead pt does not feel hot. Pt started on 04/27/20 with dry cough, pt has a lot of chest congestion, pt is wheezing and has SOB upon exertion. Pt does not have taste or smell for a long time; (cannot give me time frame) No CP, H/A or dizziness, chills,weakness as usual,abd pain or diarrhea and no ST,runny nose or vomiting.  Pt will go to ED for eval of respiratory issues and any needed testing. Pt is not sure which ED he will go to but thinks he will go to [facility] FYI to Gentry Fitz NP and Glenda Chroman FNP who is in office.

## 2020-04-28 NOTE — Telephone Encounter (Signed)
Pt called to let you know he tested positive today home covid test kit today 8/13  His symptoms Bad cough sob   Sent to access nurse to be triaged

## 2020-04-30 NOTE — Telephone Encounter (Signed)
I do not see where he went to ER. Please call and check on him.

## 2020-05-01 NOTE — Telephone Encounter (Signed)
Noted.  Appreciate the follow-up.

## 2020-05-01 NOTE — Telephone Encounter (Addendum)
Patient stated that he did not go to the ED. He did not want to sit there for hours, he stay home and rest. He feels like overall okay. Still congestion and have the dry cough.   Also cancel patient's appointment on 05/03/2020

## 2020-05-02 ENCOUNTER — Ambulatory Visit: Payer: Medicare HMO | Admitting: Primary Care

## 2020-05-04 ENCOUNTER — Ambulatory Visit: Payer: Medicare HMO | Admitting: Primary Care

## 2020-05-13 ENCOUNTER — Other Ambulatory Visit: Payer: Self-pay | Admitting: Primary Care

## 2020-05-24 ENCOUNTER — Other Ambulatory Visit: Payer: Self-pay | Admitting: Primary Care

## 2020-05-24 DIAGNOSIS — E118 Type 2 diabetes mellitus with unspecified complications: Secondary | ICD-10-CM

## 2020-05-24 DIAGNOSIS — Z794 Long term (current) use of insulin: Secondary | ICD-10-CM

## 2020-07-03 ENCOUNTER — Other Ambulatory Visit: Payer: Self-pay | Admitting: Primary Care

## 2020-07-03 DIAGNOSIS — J302 Other seasonal allergic rhinitis: Secondary | ICD-10-CM

## 2020-07-13 DIAGNOSIS — M25462 Effusion, left knee: Secondary | ICD-10-CM | POA: Diagnosis not present

## 2020-07-13 DIAGNOSIS — M25461 Effusion, right knee: Secondary | ICD-10-CM | POA: Diagnosis not present

## 2020-07-13 DIAGNOSIS — M170 Bilateral primary osteoarthritis of knee: Secondary | ICD-10-CM | POA: Diagnosis not present

## 2020-08-01 ENCOUNTER — Other Ambulatory Visit: Payer: Self-pay | Admitting: Primary Care

## 2020-08-01 DIAGNOSIS — Z794 Long term (current) use of insulin: Secondary | ICD-10-CM

## 2020-08-01 DIAGNOSIS — E119 Type 2 diabetes mellitus without complications: Secondary | ICD-10-CM

## 2020-08-01 DIAGNOSIS — E118 Type 2 diabetes mellitus with unspecified complications: Secondary | ICD-10-CM

## 2020-08-30 ENCOUNTER — Other Ambulatory Visit: Payer: Self-pay | Admitting: Primary Care

## 2020-08-30 DIAGNOSIS — I10 Essential (primary) hypertension: Secondary | ICD-10-CM

## 2020-09-27 ENCOUNTER — Other Ambulatory Visit: Payer: Self-pay | Admitting: Primary Care

## 2020-09-27 DIAGNOSIS — M170 Bilateral primary osteoarthritis of knee: Secondary | ICD-10-CM | POA: Diagnosis not present

## 2020-10-04 DIAGNOSIS — M170 Bilateral primary osteoarthritis of knee: Secondary | ICD-10-CM | POA: Diagnosis not present

## 2020-10-11 DIAGNOSIS — M170 Bilateral primary osteoarthritis of knee: Secondary | ICD-10-CM | POA: Diagnosis not present

## 2020-10-17 ENCOUNTER — Other Ambulatory Visit: Payer: Self-pay | Admitting: Primary Care

## 2020-10-17 DIAGNOSIS — Z794 Long term (current) use of insulin: Secondary | ICD-10-CM

## 2020-10-17 DIAGNOSIS — E118 Type 2 diabetes mellitus with unspecified complications: Secondary | ICD-10-CM

## 2020-10-17 NOTE — Telephone Encounter (Signed)
Pharmacy requests refill on: Lantus Solostar 100 unit/mL   LAST REFILL: 08/03/2020 (Q-60, R-0) LAST OV: 01/31/2020 NEXT OV: Not Scheduled  PHARMACY: [facility]  Hgb A1C (01/31/2020): 10.7

## 2020-11-09 ENCOUNTER — Other Ambulatory Visit: Payer: Self-pay | Admitting: Primary Care

## 2020-11-09 DIAGNOSIS — E785 Hyperlipidemia, unspecified: Secondary | ICD-10-CM

## 2020-11-10 NOTE — Telephone Encounter (Signed)
Overdue for diabetes follow up and general follow up. Please schedule CPE/Followup.  He will need this for further refills.

## 2020-11-13 NOTE — Telephone Encounter (Signed)
Called patient to schedule DM follow up. Scheduled for 3/4.

## 2020-11-17 ENCOUNTER — Ambulatory Visit (INDEPENDENT_AMBULATORY_CARE_PROVIDER_SITE_OTHER): Payer: Medicare HMO | Admitting: Primary Care

## 2020-11-17 ENCOUNTER — Encounter: Payer: Self-pay | Admitting: Primary Care

## 2020-11-17 ENCOUNTER — Other Ambulatory Visit: Payer: Self-pay

## 2020-11-17 VITALS — BP 122/58 | HR 62 | Temp 97.6°F | Ht 72.0 in | Wt 255.8 lb

## 2020-11-17 DIAGNOSIS — Z794 Long term (current) use of insulin: Secondary | ICD-10-CM | POA: Diagnosis not present

## 2020-11-17 DIAGNOSIS — M792 Neuralgia and neuritis, unspecified: Secondary | ICD-10-CM

## 2020-11-17 DIAGNOSIS — Z23 Encounter for immunization: Secondary | ICD-10-CM | POA: Diagnosis not present

## 2020-11-17 DIAGNOSIS — E119 Type 2 diabetes mellitus without complications: Secondary | ICD-10-CM | POA: Diagnosis not present

## 2020-11-17 DIAGNOSIS — E118 Type 2 diabetes mellitus with unspecified complications: Secondary | ICD-10-CM

## 2020-11-17 LAB — POCT GLYCOSYLATED HEMOGLOBIN (HGB A1C): Hemoglobin A1C: 7.4 % — AB (ref 4.0–5.6)

## 2020-11-17 MED ORDER — GABAPENTIN 300 MG PO CAPS: 300.0000 mg | ORAL_CAPSULE | Freq: Three times a day (TID) | ORAL | 1 refills | Status: DC

## 2020-11-17 MED ORDER — LANTUS SOLOSTAR 100 UNIT/ML ~~LOC~~ SOPN: 25.0000 [IU] | PEN_INJECTOR | Freq: Two times a day (BID) | SUBCUTANEOUS | 1 refills | Status: DC

## 2020-11-17 NOTE — Progress Notes (Signed)
Subjective:    Patient ID: Lawrence Parker, male    DOB: 01-Mar-1946, 75 y.o.   MRN: [id number]  HPI  This visit occurred during the SARS-CoV-2 public health emergency.  Safety protocols were in place, including screening questions prior to the visit, additional usage of staff PPE, and extensive cleaning of exam room while observing appropriate contact time as indicated for disinfecting solutions.   Lawrence Parker is a 75 year old male with a history of hypertension, PAD, type 2 diabetes, hyperlipidemia, decreased renal function, osteoarthritis who presents today for diabetes follow up.   Current medications include: Lantus 35 units in the morning and 25 units in the evening, glipizide 10 mg twice daily, Metformin 1000 mg twice daily.   He's actually been injecting Lantus 25 units BID.   He is checking his blood glucose 1 times daily and is getting readings of low 100's.  He endorses a poor diet and is not exercising.   Last A1C: 10.7 in May 2021, 7.4 today Last Eye Exam: Due, he will schedule Last Foot Exam: Due Pneumonia Vaccination: 2019 ACE/ARB: Lisinopril Statin: Simvastatin   BP Readings from Last 3 Encounters:  11/17/20 (!) 122/58  02/15/20 130/60  01/31/20 126/60      Review of Systems  Respiratory: Negative for shortness of breath.   Cardiovascular: Negative for chest pain.  Neurological: Negative for dizziness and headaches.       Past Medical History:  Diagnosis Date  . Anxiety    takes Xanax prn  . Arthritis    neck  . Back pain    deteriorating disc  . Bladder neck obstruction   . Colon polyps   . Depression    hx of but doesn't take any medications  . Diabetes mellitus    takes Actos,Glipizide,Metformin,and Lantus  . Diverticula of colon   . History of kidney stones    also has one at present time   . Hyperlipidemia    takes Zocor daily  . Hypertension    takes Amlodipine daily and HCTZ daily  . Neck pain    cervical spondylosis  . Peyronie  disease      Social History   Socioeconomic History  . Marital status: Widowed    Spouse name: Not on file  . Number of children: Not on file  . Years of education: Not on file  . Highest education level: Not on file  Occupational History  . Occupation: retired  Tobacco Use  . Smoking status: Former Smoker    Packs/day: 1.00    Types: Cigarettes  . Smokeless tobacco: Never Used  . Tobacco comment: quit smoking 37yr ago  Vaping Use  . Vaping Use: Never used  Substance and Sexual Activity  . Alcohol use: No  . Drug use: No  . Sexual activity: Not Currently  Other Topics Concern  . Not on file  Social History Narrative   Married.   2 children. 2 grandchildren.   Retired. Drove trucks for 43 years.   Enjoys traveling, spending time with family.   Social Determinants of Health   Financial Resource Strain: Not on file  Food Insecurity: Not on file  Transportation Needs: Not on file  Physical Activity: Not on file  Stress: Not on file  Social Connections: Not on file  Intimate Partner Violence: Not on file    Past Surgical History:  Procedure Laterality Date  . CERVICAL SPINE SURGERY    . CIRCUMCISION  20+yrs ago  . COLONOSCOPY    .  NOSE SURGERY  20+yrs ago  . SHOULDER SURGERY     right 20+yrs ago    Family History  Problem Relation Age of Onset  . Cancer Mother   . Diabetes Mother   . Cancer Father   . Cancer Sister   . Diabetes Sister   . Cancer Sister   . Diabetes Brother   . Anesthesia problems Neg Hx   . Hypotension Neg Hx   . Malignant hyperthermia Neg Hx   . Pseudochol deficiency Neg Hx     No Known Allergies  Current Outpatient Medications on File Prior to Visit  Medication Sig Dispense Refill  . ACCU-CHEK FASTCLIX LANCETS MISC Check blood sugar twice a day and as directed. Dx E11.9 300 each 1  . Alcohol Swabs (B-D SINGLE USE SWABS REGULAR) PADS USE AS NEEDED  TO CHECK BLOOD SUGAR DAILY 300 each 5  . amLODipine (NORVASC) 10 MG tablet TAKE 1  TABLET EVERY DAY 90 tablet 1  . aspirin 325 MG tablet Take 325 mg by mouth daily.    . Blood Glucose Calibration (ACCU-CHEK AVIVA) SOLN Use as instructed. 1 each 1  . Blood Glucose Monitoring Suppl (ACCU-CHEK AVIVA PLUS) w/Device KIT Check blood sugar twice a day and as directed. Dx E11.9 1 kit 0  . fluticasone (FLONASE) 50 MCG/ACT nasal spray USE 2 SPRAYS IN EACH NOSTRIL EVERY DAY 48 g 1  . gabapentin (NEURONTIN) 300 MG capsule TAKE 1 CAPSULE (300 MG TOTAL) BY MOUTH 3 (THREE) TIMES DAILY. 270 capsule 1  . glipiZIDE (GLUCOTROL) 10 MG tablet Take 1 tablet (10 mg total) by mouth 2 (two) times daily. For diabetes. 180 tablet 3  . glucose blood (ACCU-CHEK AVIVA PLUS) test strip TEST BLOOD SUGAR TWICE DAILY AS DIRECTED 200 strip 5  . hydrochlorothiazide (HYDRODIURIL) 25 MG tablet TAKE 1 TABLET EVERY MORNING FOR BLOOD PRESSURE 90 tablet 0  . Insulin Pen Needle 31G X 8 MM MISC Use as directed to injection insulin. 300 each 2  . Lancets (ACCU-CHEK SOFT TOUCH) lancets Check blood sugar twice a day and as directed. Dx E11.9 300 each 2  . LANTUS SOLOSTAR 100 UNIT/ML Solostar Pen INJECT 35 UNITS IN THE MORNING AND 25 UNITS IN THE EVENING. 60 mL 0  . lisinopril (ZESTRIL) 10 MG tablet TAKE 1 TABLET EVERY DAY 90 tablet 1  . metFORMIN (GLUCOPHAGE) 500 MG tablet TAKE 2 TABLETS TWICE DAILY WITH MEALS 360 tablet 1  . simvastatin (ZOCOR) 20 MG tablet Take 1 tablet (20 mg total) by mouth daily. For cholesterol. 90 tablet 0   No current facility-administered medications on file prior to visit.    BP (!) 122/58   Pulse 62   Temp 97.6 F (36.4 C) (Temporal)   Ht 6' (1.829 m)   Wt 255 lb 12 oz (116 kg)   SpO2 98%   BMI 34.69 kg/m    Objective:   Physical Exam Constitutional:      Appearance: He is well-nourished.  Cardiovascular:     Rate and Rhythm: Normal rate and regular rhythm.  Pulmonary:     Effort: Pulmonary effort is normal.     Breath sounds: Normal breath sounds.  Musculoskeletal:      Cervical back: Neck supple.  Skin:    General: Skin is warm and dry.  Psychiatric:        Mood and Affect: Mood and affect normal.            Assessment & Plan:

## 2020-11-17 NOTE — Patient Instructions (Addendum)
Continue injecting 25 units of Lantus twice daily. Continue glipizide 10 mg twice daily for diabetes. Continue Metformin 2 tablets twice daily for diabetes.  Continue to monitor your blood sugars.  Start exercising. You should be getting 150 minutes of moderate intensity exercise weekly.  It is important that you improve your diet. Please limit carbohydrates in the form of white bread, rice, pasta, sweets, fast food, fried food, sugary drinks, etc. Increase your consumption of fresh fruits and vegetables, whole grains, lean protein.  Ensure you are consuming 64 ounces of water daily.  Please schedule a follow up appointment in 3 months for your complete annual physical.  It was a pleasure to see you today!    Influenza (Flu) Vaccine (Inactivated or Recombinant): What You Need to Know 1. Why get vaccinated? Influenza vaccine can prevent influenza (flu). Flu is a contagious disease that spreads around the [place] every year, usually between October and May. Anyone can get the flu, but it is more dangerous for some people. Infants and young children, people 72 years and older, pregnant people, and people with certain health conditions or a weakened immune system are at greatest risk of flu complications. Pneumonia, bronchitis, sinus infections, and ear infections are examples of flu-related complications. If you have a medical condition, such as heart disease, cancer, or diabetes, flu can make it worse. Flu can cause fever and chills, sore throat, muscle aches, fatigue, cough, headache, and runny or stuffy nose. Some people may have vomiting and diarrhea, though this is more common in children than adults. In an average year, thousands of people in the [place] die from flu, and many more are hospitalized. Flu vaccine prevents millions of illnesses and flu-related visits to the doctor each year. 2. Influenza vaccines CDC recommends everyone 6 months and older get vaccinated every  flu season. Children 6 months through 54 years of age may need 2 doses during a single flu season. Everyone else needs only 1 dose each flu season. It takes about 2 weeks for protection to develop after vaccination. There are many flu viruses, and they are always changing. Each year a new flu vaccine is made to protect against the influenza viruses believed to be likely to cause disease in the upcoming flu season. Even when the vaccine doesn't exactly match these viruses, it may still provide some protection. Influenza vaccine does not cause flu. Influenza vaccine may be given at the same time as other vaccines. 3. Talk with your health care provider Tell your vaccination provider if the person getting the vaccine:  Has had an allergic reaction after a previous dose of influenza vaccine, or has any severe, life-threatening allergies  Has ever had Guillain-Barr Syndrome (also called "GBS") In some cases, your health care provider may decide to postpone influenza vaccination until a future visit. Influenza vaccine can be administered at any time during pregnancy. People who are or will be pregnant during influenza season should receive inactivated influenza vaccine. People with minor illnesses, such as a cold, may be vaccinated. People who are moderately or severely ill should usually wait until they recover before getting influenza vaccine. Your health care provider can give you more information. 4. Risks of a vaccine reaction  Soreness, redness, and swelling where the shot is given, fever, muscle aches, and headache can happen after influenza vaccination.  There may be a very small increased risk of Guillain-Barr Syndrome (GBS) after inactivated influenza vaccine (the flu shot). Young children who get the flu shot along  with pneumococcal vaccine (PCV13) and/or DTaP vaccine at the same time might be slightly more likely to have a seizure caused by fever. Tell your health care provider if a child  who is getting flu vaccine has ever had a seizure. People sometimes faint after medical procedures, including vaccination. Tell your provider if you feel dizzy or have vision changes or ringing in the ears. As with any medicine, there is a very remote chance of a vaccine causing a severe allergic reaction, other serious injury, or death. 5. What if there is a serious problem? An allergic reaction could occur after the vaccinated person leaves the clinic. If you see signs of a severe allergic reaction (hives, swelling of the face and throat, difficulty breathing, a fast heartbeat, dizziness, or weakness), call 9-1-1 and get the person to the nearest hospital. For other signs that concern you, call your health care provider. Adverse reactions should be reported to the Vaccine Adverse Event Reporting System (VAERS). Your health care provider will usually file this report, or you can do it yourself. Visit the VAERS website at www.vaers.SamedayNews.es or call 785-765-2626. VAERS is only for reporting reactions, and VAERS staff members do not give medical advice. 6. The National Vaccine Injury Compensation Program The [employer] Vaccine Injury Compensation Program (VICP) is a federal program that was created to compensate people who may have been injured by certain vaccines. Claims regarding alleged injury or death due to vaccination have a time limit for filing, which may be as short as two years. Visit the VICP website at GoldCloset.com.ee or call 352-420-1845 to learn about the program and about filing a claim. 7. How can I learn more?  Ask your health care provider.  Call your local or state health department.  Visit the website of the Food and Drug Administration (FDA) for vaccine package inserts and additional information at TraderRating.uy.  Contact the Centers for Disease Control and Prevention (CDC): ? Call (253)705-6987 (1-800-CDC-INFO) or ? Visit CDC's  website at https://gibson.com/. Vaccine Information Statement Inactivated Influenza Vaccine (04/21/2020) This information is not intended to replace advice given to you by your health care provider. Make sure you discuss any questions you have with your health care provider. Document Revised: 06/08/2020 Document Reviewed: 06/08/2020 Elsevier Patient Education  2021 [employer].

## 2020-11-17 NOTE — Addendum Note (Signed)
Addended by: Pleas Koch on: 11/17/2020 08:44 AM   Modules accepted: Orders

## 2020-11-17 NOTE — Assessment & Plan Note (Signed)
Improved with A1c today of 7.4.  Strongly advised to work on weight loss through diet and exercise. Continue Lantus 25 units twice daily, will change prescription in system.  Continue glipizide 10 mg twice daily and Metformin 1000 mg twice daily.  Foot exam today. Strongly advised that he schedule his eye exam. Managed on statin and ACE.  Follow-up in 3 months.

## 2020-11-30 ENCOUNTER — Other Ambulatory Visit: Payer: Self-pay | Admitting: Primary Care

## 2020-11-30 DIAGNOSIS — I10 Essential (primary) hypertension: Secondary | ICD-10-CM

## 2020-12-01 ENCOUNTER — Other Ambulatory Visit: Payer: Self-pay | Admitting: Primary Care

## 2020-12-01 DIAGNOSIS — J302 Other seasonal allergic rhinitis: Secondary | ICD-10-CM

## 2020-12-19 ENCOUNTER — Other Ambulatory Visit: Payer: Self-pay | Admitting: Primary Care

## 2020-12-19 DIAGNOSIS — E119 Type 2 diabetes mellitus without complications: Secondary | ICD-10-CM

## 2021-01-17 ENCOUNTER — Other Ambulatory Visit: Payer: Self-pay | Admitting: Primary Care

## 2021-01-17 DIAGNOSIS — E785 Hyperlipidemia, unspecified: Secondary | ICD-10-CM

## 2021-02-22 ENCOUNTER — Ambulatory Visit (INDEPENDENT_AMBULATORY_CARE_PROVIDER_SITE_OTHER): Payer: Medicare HMO | Admitting: Primary Care

## 2021-02-22 ENCOUNTER — Other Ambulatory Visit: Payer: Self-pay

## 2021-02-22 ENCOUNTER — Encounter: Payer: Self-pay | Admitting: Primary Care

## 2021-02-22 VITALS — BP 124/62 | HR 82 | Temp 97.6°F | Ht 72.0 in | Wt 255.0 lb

## 2021-02-22 DIAGNOSIS — Z0000 Encounter for general adult medical examination without abnormal findings: Secondary | ICD-10-CM

## 2021-02-22 DIAGNOSIS — Z23 Encounter for immunization: Secondary | ICD-10-CM | POA: Diagnosis not present

## 2021-02-22 DIAGNOSIS — E785 Hyperlipidemia, unspecified: Secondary | ICD-10-CM

## 2021-02-22 DIAGNOSIS — Z794 Long term (current) use of insulin: Secondary | ICD-10-CM

## 2021-02-22 DIAGNOSIS — R0600 Dyspnea, unspecified: Secondary | ICD-10-CM | POA: Diagnosis not present

## 2021-02-22 DIAGNOSIS — E119 Type 2 diabetes mellitus without complications: Secondary | ICD-10-CM

## 2021-02-22 DIAGNOSIS — I10 Essential (primary) hypertension: Secondary | ICD-10-CM | POA: Diagnosis not present

## 2021-02-22 DIAGNOSIS — Z125 Encounter for screening for malignant neoplasm of prostate: Secondary | ICD-10-CM | POA: Diagnosis not present

## 2021-02-22 DIAGNOSIS — M792 Neuralgia and neuritis, unspecified: Secondary | ICD-10-CM | POA: Diagnosis not present

## 2021-02-22 DIAGNOSIS — R600 Localized edema: Secondary | ICD-10-CM | POA: Diagnosis not present

## 2021-02-22 DIAGNOSIS — N289 Disorder of kidney and ureter, unspecified: Secondary | ICD-10-CM | POA: Diagnosis not present

## 2021-02-22 DIAGNOSIS — R0609 Other forms of dyspnea: Secondary | ICD-10-CM | POA: Insufficient documentation

## 2021-02-22 LAB — COMPREHENSIVE METABOLIC PANEL
ALT: 15 U/L (ref 0–53)
AST: 19 U/L (ref 0–37)
Albumin: 4.4 g/dL (ref 3.5–5.2)
Alkaline Phosphatase: 41 U/L (ref 39–117)
BUN: 16 mg/dL (ref 6–23)
CO2: 28 mEq/L (ref 19–32)
Calcium: 9.4 mg/dL (ref 8.4–10.5)
Chloride: 103 mEq/L (ref 96–112)
Creatinine, Ser: 1.24 mg/dL (ref 0.40–1.50)
GFR: 57.43 mL/min — ABNORMAL LOW (ref >60.00)
Glucose, Bld: 93 mg/dL (ref 70–99)
Potassium: 4.6 mEq/L (ref 3.5–5.1)
Sodium: 140 mEq/L (ref 135–145)
Total Bilirubin: 0.7 mg/dL (ref 0.2–1.2)
Total Protein: 6.9 g/dL (ref 6.0–8.3)

## 2021-02-22 LAB — LIPID PANEL
Cholesterol: 120 mg/dL (ref 0–200)
HDL: 50.5 mg/dL (ref >39.00)
LDL Cholesterol: 57 mg/dL (ref 0–99)
NonHDL: 69.61
Total CHOL/HDL Ratio: 2
Triglycerides: 63 mg/dL (ref 0.0–149.0)
VLDL: 12.6 mg/dL (ref 0.0–40.0)

## 2021-02-22 LAB — CBC
HCT: 36.8 % — ABNORMAL LOW (ref 39.0–52.0)
Hemoglobin: 12.4 g/dL — ABNORMAL LOW (ref 13.0–17.0)
MCHC: 33.8 g/dL (ref 30.0–36.0)
MCV: 86.2 fl (ref 78.0–100.0)
Platelets: 156 10*3/uL (ref 150.0–400.0)
RBC: 4.26 Mil/uL (ref 4.22–5.81)
RDW: 14.4 % (ref 11.5–15.5)
WBC: 8.8 10*3/uL (ref 4.0–10.5)

## 2021-02-22 LAB — BRAIN NATRIURETIC PEPTIDE: Pro B Natriuretic peptide (BNP): 24 pg/mL (ref 0.0–100.0)

## 2021-02-22 LAB — HEMOGLOBIN A1C: Hgb A1c MFr Bld: 8 % — ABNORMAL HIGH (ref 4.6–6.5)

## 2021-02-22 LAB — PSA, MEDICARE: PSA: 0.84 ng/ml (ref 0.10–4.00)

## 2021-02-22 MED ORDER — ZOSTER VAC RECOMB ADJUVANTED 50 MCG/0.5ML IM SUSR: 0.5000 mL | Freq: Once | INTRAMUSCULAR | 1 refills | Status: AC

## 2021-02-22 NOTE — Assessment & Plan Note (Addendum)
Chronic over the last 2 years, is now requesting handicap placard.  Exam today overall stable. No murmur. Chronic lower extremity edema. Lungs clear. Checking BNP and other labs. No recent chest xray on file.  Will provide handicap placard. Await results.

## 2021-02-22 NOTE — Assessment & Plan Note (Signed)
Compliant to simvastatin 20 mg, continue same. Repeat lipid panel pending.

## 2021-02-22 NOTE — Assessment & Plan Note (Signed)
Repeat A1C pending.  Glucose levels appear to be under decent control at 120's-130's.  Continue Glipizide 10 mg BID, metformin 500 mg BID, and Lantus 25 units BID.   Discussed to schedule an eye exam. Pneumonia vaccine UTD. He would like to try Trulicity or Ozempic for weight loss benefit.   Await results.

## 2021-02-22 NOTE — Patient Instructions (Signed)
Stop by the lab prior to leaving today. I will notify you of your results once received.   Schedule your eye doctor exam.  Take the shingles vaccine to your pharmacy for administration.   Please schedule a follow up visit for 6 months for diabetes check.  It was a pleasure to see you today!  Preventive Care 74 Years and Older, Male Preventive care refers to lifestyle choices and visits with your health care provider that can promote health and wellness. This includes: A yearly physical exam. This is also called an annual wellness visit. Regular dental and eye exams. Immunizations. Screening for certain conditions. Healthy lifestyle choices, such as: Eating a healthy diet. Getting regular exercise. Not using drugs or products that contain nicotine and tobacco. Limiting alcohol use. What can I expect for my preventive care visit? Physical exam Your health care provider will check your: Height and weight. These may be used to calculate your BMI (body mass index). BMI is a measurement that tells if you are at a healthy weight. Heart rate and blood pressure. Body temperature. Skin for abnormal spots. Counseling Your health care provider may ask you questions about your: Past medical problems. Family's medical history. Alcohol, tobacco, and drug use. Emotional well-being. Home life and relationship well-being. Sexual activity. Diet, exercise, and sleep habits. History of falls. Memory and ability to understand (cognition). Work and work Statistician. Access to firearms. What immunizations do I need? Vaccines are usually given at various ages, according to a schedule. Your health care provider will recommend vaccines for you based on your age, medical history, and lifestyle or other factors, such as travel or where you work.   What tests do I need? Blood tests Lipid and cholesterol levels. These may be checked every 5 years, or more often depending on your overall  health. Hepatitis C test. Hepatitis B test. Screening Lung cancer screening. You may have this screening every year starting at age 69 if you have a 30-pack-year history of smoking and currently smoke or have quit within the past 15 years. Colorectal cancer screening. All adults should have this screening starting at age 68 and continuing until age 25. Your health care provider may recommend screening at age 41 if you are at increased risk. You will have tests every 1-10 years, depending on your results and the type of screening test. Prostate cancer screening. Recommendations will vary depending on your family history and other risks. Genital exam to check for testicular cancer or hernias. Diabetes screening. This is done by checking your blood sugar (glucose) after you have not eaten for a while (fasting). You may have this done every 1-3 years. Abdominal aortic aneurysm (AAA) screening. You may need this if you are a current or former smoker. STD (sexually transmitted disease) testing, if you are at risk. Follow these instructions at home: Eating and drinking Eat a diet that includes fresh fruits and vegetables, whole grains, lean protein, and low-fat dairy products. Limit your intake of foods with high amounts of sugar, saturated fats, and salt. Take vitamin and mineral supplements as recommended by your health care provider. Do not drink alcohol if your health care provider tells you not to drink. If you drink alcohol: Limit how much you have to 0-2 drinks a day. Be aware of how much alcohol is in your drink. In the [place] one drink equals one 12 oz bottle of beer (355 mL), one 5 oz glass of wine (148 mL), or one 1 oz glass of hard  liquor (44 mL).   Lifestyle Take daily care of your teeth and gums. Brush your teeth every morning and night with fluoride toothpaste. Floss one time each day. Stay active. Exercise for at least 30 minutes 5 or more days each week. Do not use any products  that contain nicotine or tobacco, such as cigarettes, e-cigarettes, and chewing tobacco. If you need help quitting, ask your health care provider. Do not use drugs. If you are sexually active, practice safe sex. Use a condom or other form of protection to prevent STIs (sexually transmitted infections). Talk with your health care provider about taking a low-dose aspirin or statin. Find healthy ways to cope with stress, such as: Meditation, yoga, or listening to music. Journaling. Talking to a trusted person. Spending time with friends and family. Safety Always wear your seat belt while driving or riding in a vehicle. Do not drive: If you have been drinking alcohol. Do not ride with someone who has been drinking. When you are tired or distracted. While texting. Wear a helmet and other protective equipment during sports activities. If you have firearms in your house, make sure you follow all gun safety procedures. What's next? Visit your health care provider once a year for an annual wellness visit. Ask your health care provider how often you should have your eyes and teeth checked. Stay up to date on all vaccines. This information is not intended to replace advice given to you by your health care provider. Make sure you discuss any questions you have with your health care provider. Document Revised: 06/01/2019 Document Reviewed: 08/27/2018 Elsevier Patient Education  2021 [employer].

## 2021-02-22 NOTE — Assessment & Plan Note (Signed)
Noted on exam today without pitting. Chronic. Could be secondary to amlodipine.  Checking BNP and other labs today.

## 2021-02-22 NOTE — Assessment & Plan Note (Signed)
Shingrix due, Rx provided today. PSA due and pending. Colonoscopy date is unclear, will try to find records.  Discussed the importance of a healthy diet and regular exercise in order for weight loss, and to reduce the risk of further co-morbidity.  Exam today as noted. Labs pending

## 2021-02-22 NOTE — Assessment & Plan Note (Addendum)
Well controlled in the office today, continue amlodipine 10 mg, HCTZ 25 mg, lisinopril 10 mg daily.   CMP pending.

## 2021-02-22 NOTE — Assessment & Plan Note (Signed)
Repeat renal function pending.   Little water intake, encouraged to increase intake.  Little NSAID use.

## 2021-02-22 NOTE — Assessment & Plan Note (Signed)
Overall doing well on gabapentin 300 mg for which he takes BID mostly. Continue same.

## 2021-02-22 NOTE — Progress Notes (Signed)
Subjective:    Patient ID: Lawrence Parker, male    DOB: [birth year], 75 y.o.   MRN: [id number]  HPI  Lawrence Parker is a very pleasant 75 y.o. male who presents today for complete physical.  He would also like a handicap placard due to shortness of breath with walking long distances, chronic since Covid-19, endorses being sedentary over the last several years. He also needs a cart for stability to walk in the grocery store. He does have chronic lower extremity swelling that isn't worse. He denies chest pain and cough.   Immunizations: -Influenza: Completed this last season  -Covid-19: 2 vaccines -Shingles: Never completed  -Pneumonia: Prevnar 13 in 2017, pneumovax in 2019   Diet: [place].  Exercise: No regular exercise.  Eye exam: No recent exam  Dental exam: No recent exam   Colonoscopy: Unsure, he believes it was in 2013, due in 2023 PSA: Due    BP Readings from Last 3 Encounters:  02/22/21 124/62  11/17/20 (!) 122/58  02/15/20 130/60      Review of Systems  Constitutional:  Negative for unexpected weight change.  HENT:  Negative for rhinorrhea.   Respiratory:  Positive for shortness of breath. Negative for cough.        See HPI  Cardiovascular:  Negative for chest pain.  Gastrointestinal:  Negative for constipation and diarrhea.  Genitourinary:  Negative for difficulty urinating.  Musculoskeletal:  Positive for arthralgias.  Skin:  Negative for rash.  Allergic/Immunologic: Negative for environmental allergies.  Neurological:  Negative for dizziness and headaches.  Psychiatric/Behavioral:  The patient is not nervous/anxious.         Past Medical History:  Diagnosis Date   Anxiety    takes Xanax prn   Arthritis    neck   Back pain    deteriorating disc   Bladder neck obstruction    Colon polyps    Depression    hx of but doesn't take any medications   Diabetes mellitus    takes Actos,Glipizide,Metformin,and Lantus   Diverticula of colon     History of kidney stones    also has one at present time    Hyperlipidemia    takes Zocor daily   Hypertension    takes Amlodipine daily and HCTZ daily   Neck pain    cervical spondylosis   Peyronie disease     Social History   Socioeconomic History   Marital status: Widowed    Spouse name: Not on file   Number of children: Not on file   Years of education: Not on file   Highest education level: Not on file  Occupational History   Occupation: retired  Tobacco Use   Smoking status: Former    Packs/day: 1.00    Pack years: 0.00    Types: Cigarettes   Smokeless tobacco: Never   Tobacco comments:    quit smoking 73yr ago  Vaping Use   Vaping Use: Never used  Substance and Sexual Activity   Alcohol use: No   Drug use: No   Sexual activity: Not Currently  Other Topics Concern   Not on file  Social History Narrative   Married.   2 children. 2 grandchildren.   Retired. Drove trucks for 43 years.   Enjoys traveling, spending time with family.   Social Determinants of Health   Financial Resource Strain: Not on file  Food Insecurity: Not on file  Transportation Needs: Not on file  Physical Activity: Not on  file  Stress: Not on file  Social Connections: Not on file  Intimate Partner Violence: Not on file    Past Surgical History:  Procedure Laterality Date   CERVICAL SPINE SURGERY     CIRCUMCISION  20+yrs ago   COLONOSCOPY     NOSE SURGERY  20+yrs ago   SHOULDER SURGERY     right 20+yrs ago    Family History  Problem Relation Age of Onset   Cancer Mother    Diabetes Mother    Cancer Father    Cancer Sister    Diabetes Sister    Cancer Sister    Diabetes Brother    Anesthesia problems Neg Hx    Hypotension Neg Hx    Malignant hyperthermia Neg Hx    Pseudochol deficiency Neg Hx     No Known Allergies  Current Outpatient Medications on File Prior to Visit  Medication Sig Dispense Refill   ACCU-CHEK FASTCLIX LANCETS MISC Check blood sugar twice a  day and as directed. Dx E11.9 300 each 1   Alcohol Swabs (B-D SINGLE USE SWABS REGULAR) PADS USE AS NEEDED  TO CHECK BLOOD SUGAR DAILY 300 each 5   amLODipine (NORVASC) 10 MG tablet Take 1 tablet (10 mg total) by mouth daily. For blood pressure. 90 tablet 0   aspirin 325 MG tablet Take 325 mg by mouth daily.     Blood Glucose Calibration (ACCU-CHEK AVIVA) SOLN Use as instructed. 1 each 1   Blood Glucose Monitoring Suppl (ACCU-CHEK AVIVA PLUS) w/Device KIT Check blood sugar twice a day and as directed. Dx E11.9 1 kit 0   fluticasone (FLONASE) 50 MCG/ACT nasal spray USE 2 SPRAYS IN EACH NOSTRIL EVERY DAY 48 g 1   gabapentin (NEURONTIN) 300 MG capsule Take 1 capsule (300 mg total) by mouth 3 (three) times daily. For pain. 270 capsule 1   glipiZIDE (GLUCOTROL) 10 MG tablet TAKE 1 TABLET (10 MG TOTAL) BY MOUTH 2 (TWO) TIMES DAILY. FOR DIABETES. 180 tablet 0   glucose blood (ACCU-CHEK AVIVA PLUS) test strip TEST BLOOD SUGAR TWICE DAILY AS DIRECTED 200 strip 5   hydrochlorothiazide (HYDRODIURIL) 25 MG tablet TAKE 1 TABLET EVERY MORNING FOR BLOOD PRESSURE (NEED MD APPOINTMENT) 90 tablet 0   insulin glargine (LANTUS SOLOSTAR) 100 UNIT/ML Solostar Pen Inject 25 Units into the skin 2 (two) times daily. For diabetes. 60 mL 1   Insulin Pen Needle 31G X 8 MM MISC Use as directed to injection insulin. 300 each 2   Lancets (ACCU-CHEK SOFT TOUCH) lancets Check blood sugar twice a day and as directed. Dx E11.9 300 each 2   lisinopril (ZESTRIL) 10 MG tablet TAKE 1 TABLET EVERY DAY 90 tablet 1   metFORMIN (GLUCOPHAGE) 500 MG tablet TAKE 2 TABLETS TWICE DAILY WITH MEALS 360 tablet 1   simvastatin (ZOCOR) 20 MG tablet TAKE 1 TABLET EVERY DAY FOR CHOLESTEROL (APPOINTMENT IS NEEDED FOR REFILLS) 90 tablet 0   No current facility-administered medications on file prior to visit.    BP 124/62   Pulse 82   Temp 97.6 F (36.4 C) (Temporal)   Ht 6' (1.829 m)   Wt 255 lb (115.7 kg)   SpO2 98%   BMI 34.58 kg/m   Objective:   Physical Exam HENT:     Right Ear: Tympanic membrane and ear canal normal.     Left Ear: Tympanic membrane and ear canal normal.     Nose: Nose normal.     Right Sinus: No maxillary sinus  tenderness or frontal sinus tenderness.     Left Sinus: No maxillary sinus tenderness or frontal sinus tenderness.  Eyes:     Conjunctiva/sclera: Conjunctivae normal.     Pupils: Pupils are equal, round, and reactive to light.  Neck:     Thyroid: No thyromegaly.     Vascular: No carotid bruit.  Cardiovascular:     Rate and Rhythm: Normal rate and regular rhythm.     Heart sounds: Normal heart sounds.  Pulmonary:     Effort: Pulmonary effort is normal.     Breath sounds: Normal breath sounds. No wheezing or rales.  Abdominal:     General: Bowel sounds are normal.     Palpations: Abdomen is soft.     Tenderness: There is no abdominal tenderness.  Musculoskeletal:     Cervical back: Neck supple.     Comments: Decrease in ROM to lumbar back. Ambulates without assistance, does appear slower but is stable.  Skin:    General: Skin is warm and dry.  Neurological:     Mental Status: He is alert and oriented to person, place, and time.     Cranial Nerves: No cranial nerve deficit.     Deep Tendon Reflexes: Reflexes are normal and symmetric.  Psychiatric:        Mood and Affect: Mood normal.          Assessment & Plan:      This visit occurred during the SARS-CoV-2 public health emergency.  Safety protocols were in place, including screening questions prior to the visit, additional usage of staff PPE, and extensive cleaning of exam room while observing appropriate contact time as indicated for disinfecting solutions.

## 2021-02-23 ENCOUNTER — Telehealth: Payer: Self-pay | Admitting: Primary Care

## 2021-02-23 DIAGNOSIS — N289 Disorder of kidney and ureter, unspecified: Secondary | ICD-10-CM

## 2021-02-23 DIAGNOSIS — Z794 Long term (current) use of insulin: Secondary | ICD-10-CM

## 2021-02-23 DIAGNOSIS — E119 Type 2 diabetes mellitus without complications: Secondary | ICD-10-CM

## 2021-02-23 NOTE — Telephone Encounter (Signed)
[name] called in and wanted to get Anda Kraft to call him he didn't give me any information

## 2021-02-26 NOTE — Telephone Encounter (Signed)
Called patient states that he seen lab results and would like to start Ozempic or Trulicity. He would like [facility]. Verified all other meds with patient as listed on lab results. Is taking all as directed.

## 2021-02-27 ENCOUNTER — Other Ambulatory Visit: Payer: Self-pay

## 2021-02-27 DIAGNOSIS — E119 Type 2 diabetes mellitus without complications: Secondary | ICD-10-CM

## 2021-02-27 DIAGNOSIS — Z794 Long term (current) use of insulin: Secondary | ICD-10-CM

## 2021-02-27 MED ORDER — TRULICITY 0.75 MG/0.5ML ~~LOC~~ SOAJ: 0.7500 mg | SUBCUTANEOUS | 0 refills | Status: DC

## 2021-02-27 NOTE — Addendum Note (Signed)
Addended by: Pleas Koch on: 02/27/2021 07:05 AM   Modules accepted: Orders

## 2021-02-27 NOTE — Telephone Encounter (Signed)
Called patient had 6 mo follow up. Have made 3 month lab for a1c. Order for a1c placed did you want to add anything else?

## 2021-02-27 NOTE — Telephone Encounter (Signed)
Noted. Labs ordered and pending.

## 2021-02-27 NOTE — Addendum Note (Signed)
Addended by: Pleas Koch on: 02/27/2021 10:25 AM   Modules accepted: Orders

## 2021-02-27 NOTE — Telephone Encounter (Signed)
Noted. Rx for Trulicity sent in electronically.  Needs repeat A1C in 3 months (September), then office visit in 6 months (December)

## 2021-02-28 NOTE — Telephone Encounter (Signed)
Called patient reminded that we talked yesterday. He will call if any questions.

## 2021-02-28 NOTE — Telephone Encounter (Signed)
Patient called in stating he never heard back from anyone and would like a returned phone call.

## 2021-03-01 ENCOUNTER — Other Ambulatory Visit: Payer: Self-pay | Admitting: Primary Care

## 2021-03-01 DIAGNOSIS — I10 Essential (primary) hypertension: Secondary | ICD-10-CM

## 2021-05-14 ENCOUNTER — Telehealth: Payer: Self-pay | Admitting: Primary Care

## 2021-05-14 NOTE — Telephone Encounter (Signed)
Patient states when went to reorder was going to be twice as much for refill. Wanted to now what else he could change to.

## 2021-05-14 NOTE — Telephone Encounter (Signed)
Pt called the TRULICITY has now become way to expensive and wanted to know what the alternative would be

## 2021-05-15 NOTE — Telephone Encounter (Signed)
Have patient call his insurance company and find out what happened.   Is he in the donut hole with medicare? Have him ask his insurance about other options such as: -[place]  Have him update [place] ASAP.

## 2021-05-15 NOTE — Telephone Encounter (Signed)
Called patient will call insurance gave list of options. Will call back as soon as they have talked to them.

## 2021-05-29 DIAGNOSIS — H52223 Regular astigmatism, bilateral: Secondary | ICD-10-CM | POA: Diagnosis not present

## 2021-05-30 ENCOUNTER — Other Ambulatory Visit (INDEPENDENT_AMBULATORY_CARE_PROVIDER_SITE_OTHER): Payer: Medicare HMO

## 2021-05-30 ENCOUNTER — Other Ambulatory Visit: Payer: Medicare HMO

## 2021-05-30 ENCOUNTER — Other Ambulatory Visit: Payer: Self-pay

## 2021-05-30 DIAGNOSIS — Z794 Long term (current) use of insulin: Secondary | ICD-10-CM | POA: Diagnosis not present

## 2021-05-30 DIAGNOSIS — N289 Disorder of kidney and ureter, unspecified: Secondary | ICD-10-CM

## 2021-05-30 DIAGNOSIS — E119 Type 2 diabetes mellitus without complications: Secondary | ICD-10-CM | POA: Diagnosis not present

## 2021-05-30 LAB — BASIC METABOLIC PANEL
BUN: 12 mg/dL (ref 6–23)
CO2: 27 mEq/L (ref 19–32)
Calcium: 9.2 mg/dL (ref 8.4–10.5)
Chloride: 104 mEq/L (ref 96–112)
Creatinine, Ser: 1.05 mg/dL (ref 0.40–1.50)
GFR: 69.98 mL/min (ref >60.00)
Glucose, Bld: 112 mg/dL — ABNORMAL HIGH (ref 70–99)
Potassium: 3.5 mEq/L (ref 3.5–5.1)
Sodium: 140 mEq/L (ref 135–145)

## 2021-05-30 LAB — HEMOGLOBIN A1C: Hgb A1c MFr Bld: 8.7 % — ABNORMAL HIGH (ref 4.6–6.5)

## 2021-05-31 ENCOUNTER — Telehealth: Payer: Self-pay | Admitting: Primary Care

## 2021-05-31 NOTE — Telephone Encounter (Signed)
Left message to return call to our office.  

## 2021-05-31 NOTE — Telephone Encounter (Signed)
Pt called  stating that Lawrence Parker office call lvm for him to call back. Please give him a call back

## 2021-06-01 NOTE — Telephone Encounter (Signed)
Returning phone call °

## 2021-06-02 ENCOUNTER — Ambulatory Visit: Payer: Medicare HMO

## 2021-06-04 NOTE — Telephone Encounter (Signed)
This is being completed in result notes.

## 2021-06-05 NOTE — Telephone Encounter (Signed)
Returning phone call °

## 2021-06-05 NOTE — Telephone Encounter (Signed)
Spoke with patient. Lab result notes.

## 2021-06-06 ENCOUNTER — Ambulatory Visit (INDEPENDENT_AMBULATORY_CARE_PROVIDER_SITE_OTHER): Payer: Medicare HMO | Admitting: Internal Medicine

## 2021-06-06 ENCOUNTER — Telehealth: Payer: Self-pay | Admitting: Primary Care

## 2021-06-06 ENCOUNTER — Other Ambulatory Visit: Payer: Self-pay

## 2021-06-06 ENCOUNTER — Encounter: Payer: Self-pay | Admitting: Internal Medicine

## 2021-06-06 DIAGNOSIS — R519 Headache, unspecified: Secondary | ICD-10-CM

## 2021-06-06 HISTORY — DX: Headache, unspecified: R51.9

## 2021-06-06 NOTE — Progress Notes (Signed)
Subjective:    Patient ID: Lawrence Parker, male    DOB: [birth year], 75 y.o.   MRN: [id number]  HPI Here due to headache This visit occurred during the SARS-CoV-2 public health emergency.  Safety protocols were in place, including screening questions prior to the visit, additional usage of staff PPE, and extensive cleaning of exam room while observing appropriate contact time as indicated for disinfecting solutions.   Having a frontal headache---for about 2 months Seems fairly steady---"aggravating" Not severe but doesn't go away Better if he lies down---awakens okay then starts  No dizziness Has had some vision change--new glasses but they are blurry ("like they are too strong") HA not associated with the glasses No focal weakness, facial droop, aphasia No jaw claudication  Drinks coffee-- 2-3 cups in AM Drinks half a gallon of unsweetened tea a day--and some pepsi  Uses tylenol --not really helpful No regular use   Did check BP at drug [facility]---357 systolic (but had headache then)  Current Outpatient Medications on File Prior to Visit  Medication Sig Dispense Refill   ACCU-CHEK FASTCLIX LANCETS MISC Check blood sugar twice a day and as directed. Dx E11.9 300 each 1   Alcohol Swabs (B-D SINGLE USE SWABS REGULAR) PADS USE AS NEEDED  TO CHECK BLOOD SUGAR DAILY 300 each 5   amLODipine (NORVASC) 10 MG tablet TAKE 1 TABLET EVERY DAY FOR BLOOD PRESSURE 90 tablet 3   aspirin 325 MG tablet Take 325 mg by mouth daily.     Blood Glucose Calibration (ACCU-CHEK AVIVA) SOLN Use as instructed. 1 each 1   Blood Glucose Monitoring Suppl (ACCU-CHEK AVIVA PLUS) w/Device KIT Check blood sugar twice a day and as directed. Dx E11.9 1 kit 0   fluticasone (FLONASE) 50 MCG/ACT nasal spray USE 2 SPRAYS IN EACH NOSTRIL EVERY DAY 48 g 1   gabapentin (NEURONTIN) 300 MG capsule Take 1 capsule (300 mg total) by mouth 3 (three) times daily. For pain. 270 capsule 1   glipiZIDE (GLUCOTROL) 10 MG tablet TAKE 1  TABLET (10 MG TOTAL) BY MOUTH 2 (TWO) TIMES DAILY. FOR DIABETES. 180 tablet 0   glucose blood (ACCU-CHEK AVIVA PLUS) test strip TEST BLOOD SUGAR TWICE DAILY AS DIRECTED 200 strip 5   hydrochlorothiazide (HYDRODIURIL) 25 MG tablet TAKE 1 TABLET EVERY MORNING FOR BLOOD PRESSURE 90 tablet 3   insulin glargine (LANTUS SOLOSTAR) 100 UNIT/ML Solostar Pen Inject 25 Units into the skin 2 (two) times daily. For diabetes. 60 mL 1   Insulin Pen Needle 31G X 8 MM MISC Use as directed to injection insulin. 300 each 2   Lancets (ACCU-CHEK SOFT TOUCH) lancets Check blood sugar twice a day and as directed. Dx E11.9 300 each 2   lisinopril (ZESTRIL) 10 MG tablet TAKE 1 TABLET EVERY DAY 90 tablet 1   metFORMIN (GLUCOPHAGE) 500 MG tablet TAKE 2 TABLETS TWICE DAILY WITH MEALS 360 tablet 1   simvastatin (ZOCOR) 20 MG tablet TAKE 1 TABLET EVERY DAY FOR CHOLESTEROL (APPOINTMENT IS NEEDED FOR REFILLS) 90 tablet 0   Dulaglutide (TRULICITY) 0.17 BL/3.9QZ SOPN Inject 0.75 mg into the skin once a week. For diabetes. (Patient not taking: Reported on 06/06/2021) 6 mL 0   No current facility-administered medications on file prior to visit.    No Known Allergies  Past Medical History:  Diagnosis Date   Anxiety    takes Xanax prn   Arthritis    neck   Back pain    deteriorating disc   Bladder  neck obstruction    Colon polyps    Depression    hx of but doesn't take any medications   Diabetes mellitus    takes Actos,Glipizide,Metformin,and Lantus   Diverticula of colon    History of kidney stones    also has one at present time    Hyperlipidemia    takes Zocor daily   Hypertension    takes Amlodipine daily and HCTZ daily   Neck pain    cervical spondylosis   Peyronie disease     Past Surgical History:  Procedure Laterality Date   CERVICAL SPINE SURGERY     CIRCUMCISION  20+yrs ago   COLONOSCOPY     NOSE SURGERY  20+yrs ago   SHOULDER SURGERY     right 20+yrs ago    Family History  Problem Relation  Age of Onset   Cancer Mother    Diabetes Mother    Cancer Father    Cancer Sister    Diabetes Sister    Cancer Sister    Diabetes Brother    Anesthesia problems Neg Hx    Hypotension Neg Hx    Malignant hyperthermia Neg Hx    Pseudochol deficiency Neg Hx     Social History   Socioeconomic History   Marital status: Married    Spouse name: Not on file   Number of children: Not on file   Years of education: Not on file   Highest education level: Not on file  Occupational History   Occupation: retired  Tobacco Use   Smoking status: Former    Packs/day: 1.00    Types: Cigarettes   Smokeless tobacco: Never   Tobacco comments:    quit smoking 61yr ago  Vaping Use   Vaping Use: Never used  Substance and Sexual Activity   Alcohol use: No   Drug use: No   Sexual activity: Not Currently  Other Topics Concern   Not on file  Social History Narrative   Married.   2 children. 2 grandchildren.   Retired. Drove trucks for 43 years.   Enjoys traveling, spending time with family.   Social Determinants of Health   Financial Resource Strain: Not on file  Food Insecurity: Not on file  Transportation Needs: Not on file  Physical Activity: Not on file  Stress: Not on file  Social Connections: Not on file  Intimate Partner Violence: Not on file   Review of Systems Not sleeping well--relatively new Gets up early to take granddaughter to school      Objective:   Physical Exam Constitutional:      Appearance: He is well-developed.  Cardiovascular:     Rate and Rhythm: Normal rate and regular rhythm.     Heart sounds: Normal heart sounds. No murmur heard.   No gallop.  Pulmonary:     Effort: Pulmonary effort is normal.     Breath sounds: Normal breath sounds. No wheezing or rales.  Musculoskeletal:     Right lower leg: No edema.     Left lower leg: No edema.  Neurological:     Mental Status: He is alert and oriented to person, place, and time.     Cranial Nerves:  Cranial nerves are intact.     Motor: No weakness or abnormal muscle tone.     Coordination: Romberg sign negative. Coordination normal.     Gait: Gait normal.           Assessment & Plan:

## 2021-06-06 NOTE — Assessment & Plan Note (Signed)
Wide differential----BP is okay No neurologic symptoms to suggest intracranial lesion--will hold off on brain imaging Not temporal arteritis  I think the best bet is caffeine related Asked hm to stop all caffeine after morning coffee (may help sleep as well) If persists--consider headache specialist

## 2021-06-06 NOTE — Patient Instructions (Signed)
You can have your morning coffee--but make sure your beverages are all caffeine free after that. If the headache persists after a few weeks, I would set you up to see a headache specialist.

## 2021-06-06 NOTE — Telephone Encounter (Signed)
Sharyn Lull,  Does this patient qualify for patient assistance for Trulicity?  Thanks! Allie Bossier, NP-C

## 2021-06-06 NOTE — Telephone Encounter (Signed)
-----   Message from [place] sent at 06/05/2021 11:22 AM EDT ----- Spoke with patient today. Patient is in donut hole now and Trulicity injection for 3 months supply will be $1200, patient can not do this. Last injection was about a week ago and he does not have anymore. I confirmed that he is taking the rest of his diabetic medications as stated in the chart. He does state he has been using Lantus only about 30 units a day on some days while on Trulicity injections because his sugars would be under 100. Now sugars are just under 200 and he is back to using Lantus at 25 units twice daily. Patient then states he has been having headaches for 1 month daily, come and go. Has taking Aleve with not much of a relief. Took Ibuprofen yesterday and it seems to help better. No other sx with headache-pt scheduled to see Dr Silvio Pate tomorrow for headaches 06/05/21

## 2021-06-06 NOTE — Telephone Encounter (Signed)
Yes, spoke to patient. He report income is less than 400% FPL and he would like to apply. Patient would like [place] to mail and he will call me when he receives it in the mail.  Will have my assistant mail the forms to patient.  Debbora Dus, PharmD Clinical Pharmacist [facility] at [facility] [phone]

## 2021-06-06 NOTE — Telephone Encounter (Signed)
Thank you Michelle.  

## 2021-06-12 NOTE — Progress Notes (Addendum)
Completed patient assistance form for Trulicity 0.35 mg one weekly 4 month supply with no refills to Lawrence Parker on 06/12/2021. Application will be printed and mailed to patient on 06/13/21.  Lawrence Parker, PharmD Clinical Pharmacist [facility] at [facility] [phone]

## 2021-06-14 ENCOUNTER — Other Ambulatory Visit: Payer: Self-pay | Admitting: Primary Care

## 2021-06-14 DIAGNOSIS — E119 Type 2 diabetes mellitus without complications: Secondary | ICD-10-CM

## 2021-06-20 DIAGNOSIS — E113311 Type 2 diabetes mellitus with moderate nonproliferative diabetic retinopathy with macular edema, right eye: Secondary | ICD-10-CM | POA: Diagnosis not present

## 2021-07-03 DIAGNOSIS — H43813 Vitreous degeneration, bilateral: Secondary | ICD-10-CM | POA: Diagnosis not present

## 2021-07-03 DIAGNOSIS — H3582 Retinal ischemia: Secondary | ICD-10-CM | POA: Diagnosis not present

## 2021-07-03 DIAGNOSIS — E113393 Type 2 diabetes mellitus with moderate nonproliferative diabetic retinopathy without macular edema, bilateral: Secondary | ICD-10-CM | POA: Diagnosis not present

## 2021-07-03 DIAGNOSIS — H35371 Puckering of macula, right eye: Secondary | ICD-10-CM | POA: Diagnosis not present

## 2021-07-04 ENCOUNTER — Other Ambulatory Visit: Payer: Self-pay | Admitting: Primary Care

## 2021-07-04 ENCOUNTER — Telehealth: Payer: Self-pay

## 2021-07-04 DIAGNOSIS — J302 Other seasonal allergic rhinitis: Secondary | ICD-10-CM

## 2021-07-04 NOTE — Progress Notes (Addendum)
    Chronic Care Management Pharmacy Assistant   Name: Lawrence Parker  MRN: [id number] DOB: [birth year]   Reason for Encounter: CCM (Patient Assistance - [place])     07/10/2021 - Called patient; he verified that with him and his wife they make over the $73,240 income limit. He does not want to pay for Trulicity. Patient is going to call insurance company to see what other alternatives would be good for him.  Time Spent: 5 minutes  07/04/2021 - Patient returned my call; states they make too much to be eligible.   07/04/21 - Called patient to verify he received PAP forms. Left Message  06/25/21 - Called patient to verify he received PAP forms; Left Message  06/13/21 - PAP forms mailed to patient  06/07/21 - Patient Assistance forms for [place] completed and Uploaded  Debbora Dus, CPP notified  Marijean Niemann, [place] [phone]   Time Spent: 10 Minutes

## 2021-07-05 NOTE — Telephone Encounter (Signed)
Reviewed income requirements with patient prior to starting the application (income must be < 400% FPL - $73,240 for family of 2) and he stated they would be within this limitation. Is patient able to afford medication without assistance or is he still wanting to change medications? Has he checked with insurance for more affordable options?   Debbora Dus, PharmD Clinical Pharmacist [facility] at [facility] [phone]

## 2021-07-10 ENCOUNTER — Ambulatory Visit (INDEPENDENT_AMBULATORY_CARE_PROVIDER_SITE_OTHER): Payer: Medicare HMO

## 2021-07-10 VITALS — Ht 72.0 in | Wt 220.0 lb

## 2021-07-10 DIAGNOSIS — Z0000 Encounter for general adult medical examination without abnormal findings: Secondary | ICD-10-CM

## 2021-07-10 NOTE — Progress Notes (Signed)
Subjective:   Lawrence Parker is a 75 y.o. male who presents for Medicare Annual/Subsequent preventive examination. I connected with Drema Balzarine today by telephone and verified that I am speaking with the correct person using two identifiers. Location patient: home Location provider: work Persons participating in the virtual visit: patient, Marine scientist.    I discussed the limitations, risks, security and privacy concerns of performing an evaluation and management service by telephone and the availability of in person appointments. I also discussed with the patient that there may be a patient responsible charge related to this service. The patient expressed understanding and verbally consented to this telephonic visit.    Interactive audio and video telecommunications were attempted between this provider and patient, however failed, due to patient having technical difficulties OR patient did not have access to video capability.  We continued and completed visit with audio only.  Some vital signs may be absent or patient reported.   Time Spent with patient on telephone encounter: 45 minutes   Review of Systems     Cardiac Risk Factors include: diabetes mellitus;advanced age (>32mn, >>66women);dyslipidemia;hypertension     Objective:    Today's Vitals   07/10/21 1201  Weight: 220 lb (99.8 kg)  Height: 6' (1.829 m)   Body mass index is 29.84 kg/m.  Advanced Directives 07/10/2021 02/15/2020 04/23/2019 04/20/2018 11/15/2011 11/08/2011  Does Patient Have a Medical Advance Directive? No No No Yes Patient does not have advance directive Patient does not have advance directive;Patient would like information  Type of Advance Directive - - - [place] - -  Copy of [place]? - - - No - copy requested - -  Would patient like information on creating a medical advance directive? Yes (MAU/Ambulatory/Procedural Areas - Information given) No -  Patient declined - - - Advance directive packet given  Pre-existing out of facility DNR order (yellow form or pink MOST form) - - - - No No    Current Medications (verified) Outpatient Encounter Medications as of 07/10/2021  Medication Sig   ACCU-CHEK FASTCLIX LANCETS MISC Check blood sugar twice a day and as directed. Dx E11.9   Alcohol Swabs (B-D SINGLE USE SWABS REGULAR) PADS USE AS NEEDED  TO CHECK BLOOD SUGAR DAILY   amLODipine (NORVASC) 10 MG tablet TAKE 1 TABLET EVERY DAY FOR BLOOD PRESSURE   aspirin 325 MG tablet Take 325 mg by mouth daily.   Blood Glucose Calibration (ACCU-CHEK AVIVA) SOLN Use as instructed.   Blood Glucose Monitoring Suppl (ACCU-CHEK AVIVA PLUS) w/Device KIT Check blood sugar twice a day and as directed. Dx E11.9   Dulaglutide (TRULICITY) 03.24MMW/1.0UVSOPN Inject 0.75 mg into the skin once a week. For diabetes.   fluticasone (FLONASE) 50 MCG/ACT nasal spray USE 2 SPRAYS IN EACH NOSTRIL EVERY DAY   gabapentin (NEURONTIN) 300 MG capsule Take 1 capsule (300 mg total) by mouth 3 (three) times daily. For pain.   glipiZIDE (GLUCOTROL) 10 MG tablet TAKE 1 TABLET TWICE DAILY FOR DIABETES   glucose blood (ACCU-CHEK AVIVA PLUS) test strip TEST BLOOD SUGAR TWICE DAILY AS DIRECTED   hydrochlorothiazide (HYDRODIURIL) 25 MG tablet TAKE 1 TABLET EVERY MORNING FOR BLOOD PRESSURE   insulin glargine (LANTUS SOLOSTAR) 100 UNIT/ML Solostar Pen Inject 25 Units into the skin 2 (two) times daily. For diabetes.   Insulin Pen Needle 31G X 8 MM MISC Use as directed to injection insulin.   Lancets (ACCU-CHEK SOFT TOUCH) lancets Check blood  sugar twice a day and as directed. Dx E11.9   lisinopril (ZESTRIL) 10 MG tablet TAKE 1 TABLET EVERY DAY   metFORMIN (GLUCOPHAGE) 500 MG tablet TAKE 2 TABLETS TWICE DAILY WITH MEALS   simvastatin (ZOCOR) 20 MG tablet TAKE 1 TABLET EVERY DAY FOR CHOLESTEROL (APPOINTMENT IS NEEDED FOR REFILLS)   No facility-administered encounter medications on file as of  07/10/2021.    Allergies (verified) Patient has no known allergies.   History: Past Medical History:  Diagnosis Date   Anxiety    takes Xanax prn   Arthritis    neck   Back pain    deteriorating disc   Bladder neck obstruction    Colon polyps    Depression    hx of but doesn't take any medications   Diabetes mellitus    takes Actos,Glipizide,Metformin,and Lantus   Diverticula of colon    History of kidney stones    also has one at present time    Hyperlipidemia    takes Zocor daily   Hypertension    takes Amlodipine daily and HCTZ daily   Neck pain    cervical spondylosis   Peyronie disease    Past Surgical History:  Procedure Laterality Date   CERVICAL SPINE SURGERY     CIRCUMCISION  20+yrs ago   COLONOSCOPY     NOSE SURGERY  20+yrs ago   SHOULDER SURGERY     right 20+yrs ago   Family History  Problem Relation Age of Onset   Cancer Mother    Diabetes Mother    Cancer Father    Cancer Sister    Diabetes Sister    Cancer Sister    Diabetes Brother    Anesthesia problems Neg Hx    Hypotension Neg Hx    Malignant hyperthermia Neg Hx    Pseudochol deficiency Neg Hx    Social History   Socioeconomic History   Marital status: Married    Spouse name: Not on file   Number of children: Not on file   Years of education: Not on file   Highest education level: Not on file  Occupational History   Occupation: retired  Tobacco Use   Smoking status: Former    Packs/day: 1.00    Types: Cigarettes   Smokeless tobacco: Never   Tobacco comments:    quit smoking 84yr ago  Vaping Use   Vaping Use: Never used  Substance and Sexual Activity   Alcohol use: No   Drug use: No   Sexual activity: Not Currently  Other Topics Concern   Not on file  Social History Narrative   Married.   2 children. 2 grandchildren.   Retired. Drove trucks for 43 years.   Enjoys traveling, spending time with family.   Social Determinants of Health   Financial Resource Strain:  Low Risk    Difficulty of Paying Living Expenses: Not hard at all  Food Insecurity: No Food Insecurity   Worried About RCharity fundraiserin the Last Year: Never true   RMount Vernonin the Last Year: Never true  Transportation Needs: No Transportation Needs   Lack of Transportation (Medical): No   Lack of Transportation (Non-Medical): No  Physical Activity: Sufficiently Active   Days of Exercise per Week: 3 days   Minutes of Exercise per Session: 50 min  Stress: No Stress Concern Present   Feeling of Stress : Not at all  Social Connections: Moderately Isolated   Frequency of Communication with Friends  and Family: More than three times a week   Frequency of Social Gatherings with Friends and Family: More than three times a week   Attends Religious Services: Never   Marine scientist or Organizations: No   Attends Music therapist: Never   Marital Status: Married    Tobacco Counseling Counseling given: Not Answered Tobacco comments: quit smoking 13yr ago   Clinical Intake:  Pre-visit preparation completed: Yes  Pain : No/denies pain     BMI - recorded: 29.84 Nutritional Status: BMI 25 -29 Overweight Nutritional Risks: None Diabetes: Yes CBG done?: No (telephone visit) Did pt. bring in CBG monitor from home?: No  How often do you need to have someone help you when you read instructions, pamphlets, or other written materials from your doctor or pharmacy?: 1 - Never  Diabetes:  Is the patient diabetic?  Yes  If diabetic, was a CBG obtained today?  No ,  , visit completed over the phone. Did the patient bring in their glucometer from home?  No  , visit completed over the phone. How often do you monitor your CBG's? Daily .   Financial Strains and Diabetes Management:  Are you having any financial strains with the device, your supplies or your medication? No .  Does the patient want to be seen by Chronic Care Management for management of their  diabetes?  No  Would the patient like to be referred to a Nutritionist or for Diabetic Management?  No   Diabetic Exams:  Diabetic Eye Exam: Patient states exam Completed 06/2021. Pt has been advised about the importance in completing this exam.  Diabetic Foot Exam:   Completed 11/17/20, has been advised about the importance in completing this exam. Pt is scheduled for appointment with PCP.   Interpreter Needed?: No  Information entered by :: TOrrin BrighamLPN   Activities of Daily Living In your present state of health, do you have any difficulty performing the following activities: 07/10/2021 02/22/2021  Hearing? N N  Vision? N N  Difficulty concentrating or making decisions? N N  Walking or climbing stairs? N N  Dressing or bathing? N N  Doing errands, shopping? N N  Preparing Food and eating ? N -  Using the Toilet? N -  In the past six months, have you accidently leaked urine? N -  Do you have problems with loss of bowel control? N -  Managing your Medications? N -  Managing your Finances? N -  Housekeeping or managing your Housekeeping? N -  Some recent data might be hidden    Patient Care Team: CPleas Koch NP as PCP - General (Internal Medicine)  Indicate any recent Medical Services you may have received from other than [facility] providers in the past year (date may be approximate).     Assessment:   This is a routine wellness examination for Lawrence Parker.  Hearing/Vision screen Hearing Screening - Comments:: No issues [facility] - Comments[facility]   Dietary issues and exercise activities discussed: Current Exercise Habits: Home exercise routine, Type of exercise: Other - see comments;walking (ride bike), Time (Minutes): 50, Frequency (Times/Week): 3, Weekly Exercise (Minutes/Week): 150, Intensity: Moderate   Goals Addressed             This Visit's Progress    Patient Stated       Would like to lose more weight and  decrease blood sugar numbers.       Depression  Screen PHQ 2/9 Scores 07/10/2021 02/22/2021 02/15/2020 04/23/2019 04/20/2018 04/15/2017  PHQ - 2 Score 0 1 0 0 0 0  PHQ- 9 Score - 7 - 0 0 -    Fall Risk Fall Risk  07/10/2021 02/22/2021 02/15/2020 04/23/2019 04/20/2018  Falls in the past year? 0 0 1 0 No  Number falls in past yr: 0 0 0 - -  Injury with Fall? 0 0 1 - -  Comment - - pt reports he hurt his left knee with fall - no surgery - -  Risk for fall due to : No Fall Risks - History of fall(s);Impaired balance/gait Medication side effect -  Follow up Falls prevention discussed - Falls prevention discussed Falls evaluation completed;Falls prevention discussed -    FALL RISK PREVENTION PERTAINING TO THE HOME:  Any stairs in or around the home? Yes  If so, are there any without handrails? Yes  Home free of loose throw rugs in walkways, pet beds, electrical cords, etc? Yes  Adequate lighting in your home to reduce risk of falls? Yes   ASSISTIVE DEVICES UTILIZED TO PREVENT FALLS:  Life alert? No  Use of a cane, walker or w/c? No  Grab bars in the bathroom? No  Shower chair or bench in shower? No  Elevated toilet seat or a handicapped toilet? No   TIMED UP AND GO:  Was the test performed? No . Visit completed over the phone.   Cognitive Function: Normal cognitive status assessed by direct observation by this Nurse Health Advisor. No abnormalities found.   MMSE - Mini Mental State Exam 04/23/2019 04/20/2018  Orientation to time 5 5  Orientation to Place 5 5  Registration 3 3  Attention/ Calculation 0 0  Attention/Calculation-comments could not spell word -  Recall 3 3  Language- name 2 objects 0 0  Language- repeat 1 1  Language- follow 3 step command 0 3  Language- read & follow direction 0 0  Write a sentence 0 0  Copy design 0 0  Total score 17 20        Immunizations Immunization History  Administered Date(s) Administered   Fluad Quad(high Dose 65+) 08/02/2019, 11/17/2020    Influenza,inj,Quad PF,6+ Mos 07/15/2016, 10/16/2017, 08/06/2018   PFIZER(Purple Top)SARS-COV-2 Vaccination 10/18/2019, 11/15/2019   Pneumococcal Conjugate-13 07/15/2016   Pneumococcal Polysaccharide-23 11/16/2011, 04/20/2018    TDAP status: Due, Education has been provided regarding the importance of this vaccine. Advised may receive this vaccine at local pharmacy or Health Dept. Aware to provide a copy of the vaccination record if obtained from local pharmacy or Health Dept. Verbalized acceptance and understanding.  Flu Vaccine status: Due, Education has been provided regarding the importance of this vaccine. Advised may receive this vaccine at local pharmacy or Health Dept. Aware to provide a copy of the vaccination record if obtained from local pharmacy or Health Dept. Verbalized acceptance and understanding.  Pneumococcal vaccine status: Up to date  Covid-19 vaccine status: Declined, Education has been provided regarding the importance of this vaccine but patient still declined. Advised may receive this vaccine at local pharmacy or Health Dept.or vaccine clinic. Aware to provide a copy of the vaccination record if obtained from local pharmacy or Health Dept. Verbalized acceptance and understanding.  Qualifies for Shingles Vaccine? Yes   Zostavax completed No   Shingrix Completed No, patient states first dose was given 3 months ago.    Screening Tests Health Maintenance  Topic Date Due   TETANUS/TDAP  Never done   Zoster  Vaccines- Shingrix (1 of 2) Never done   OPHTHALMOLOGY EXAM  01/15/2019   INFLUENZA VACCINE  04/16/2021   COLONOSCOPY (Pts 45-34yr Insurance coverage will need to be confirmed)  09/16/2021   FOOT EXAM  11/17/2021   HEMOGLOBIN A1C  11/27/2021   Pneumonia Vaccine 75 Years old  Completed   Hepatitis C Screening  Completed   HPV VACCINES  Aged Out   COVID-19 Vaccine  Discontinued    Health Maintenance  Health Maintenance Due  Topic Date Due   TETANUS/TDAP   Never done   Zoster Vaccines- Shingrix (1 of 2) Never done   OPHTHALMOLOGY EXAM  01/15/2019   INFLUENZA VACCINE  04/16/2021    Colorectal Cancer Screening: Per patient completed 09/17/11, due 09/16/21.  Lung Cancer Screening: (Low Dose CT Chest recommended if Age 964-80years, 30 pack-year currently smoking OR have quit w/in 15years.) does not qualify.    Additional Screening:  Hepatitis C Screening: Completed 05/16/17  Vision Screening: Recommended annual ophthalmology exams for early detection of glaucoma and other disorders of the eye. Is the patient up to date with their annual eye exam?  Yes , patient states exam completed 06/2021 Who is the provider or what is the name of the office in which the patient attends annual eye exams? [facility]  Dental Screening: Recommended annual dental exams for proper oral hygiene  Community Resource Referral / Chronic Care Management: CRR required this visit?  No   CCM required this visit?  No      Plan:     I have personally reviewed and noted the following in the patient's chart:   Medical and social history Use of alcohol, tobacco or illicit drugs  Current medications and supplements including opioid prescriptions. Patient is not currently taking opioid prescriptions. Functional ability and status Nutritional status Physical activity Advanced directives List of other physicians Hospitalizations, surgeries, and ER visits in previous 12 months Vitals Screenings to include cognitive, depression, and falls Referrals and appointments  In addition, I have reviewed and discussed with patient certain preventive protocols, quality metrics, and best practice recommendations. A written personalized care plan for preventive services as well as general preventive health recommendations were provided to patient. Due to this being a telephonic visit, the after visit summary with patients personalized plan was offered to patient via mail or  my-chart. Patient would like to access on my-chart/ per request,     TLoma Messing LPN   [phone]  Nurse Health Advisor  Nurse Notes: None

## 2021-07-10 NOTE — Patient Instructions (Signed)
[name] , Thank you for taking time to complete your Medicare Wellness Visit. I appreciate your ongoing commitment to your health goals. Please review the following plan we discussed and let me know if I can assist you in the future.   Screening recommendations/referrals: Colonoscopy: Per over conversation, completed 09/17/11, Due 1/1/ 2023 Recommended yearly ophthalmology/optometry visit for glaucoma screening and checkup Recommended yearly dental visit for hygiene and checkup  Vaccinations: Influenza vaccine: Due-May obtain vaccine at our office or your local pharmacy. Pneumococcal vaccine: Up to date  Tdap vaccine: Due-May obtain vaccine at your local pharmacy. Shingles vaccine: Per our conversation, 1st dose was done 3 months ago, please bring vaccine information to your next visit.   Covid-19: Declined today local pharmacy to schedule if you change your mind   Advanced directives:  information available @ office  Conditions/risks identified: none  Next appointment: Follow up in one year for your annual wellness visit. 07/16/22 @ 10:30am, this will be a telephone visit.  Preventive Care 13 Years and Older, Male Preventive care refers to lifestyle choices and visits with your health care provider that can promote health and wellness. What does preventive care include? A yearly physical exam. This is also called an annual well check. Dental exams once or twice a year. Routine eye exams. Ask your health care provider how often you should have your eyes checked. Personal lifestyle choices, including: Daily care of your teeth and gums. Regular physical activity. Eating a healthy diet. Avoiding tobacco and drug use. Limiting alcohol use. Practicing safe sex. Taking low doses of aspirin every day. Taking vitamin and mineral supplements as recommended by your health care provider. What happens during an annual well check? The services and screenings done by your health care provider  during your annual well check will depend on your age, overall health, lifestyle risk factors, and family history of disease. Counseling  Your health care provider may ask you questions about your: Alcohol use. Tobacco use. Drug use. Emotional well-being. Home and relationship well-being. Sexual activity. Eating habits. History of falls. Memory and ability to understand (cognition). Work and work Statistician. Screening  You may have the following tests or measurements: Height, weight, and BMI. Blood pressure. Lipid and cholesterol levels. These may be checked every 5 years, or more frequently if you are over 72 years old. Skin check. Lung cancer screening. You may have this screening every year starting at age 48 if you have a 30-pack-year history of smoking and currently smoke or have quit within the past 15 years. Fecal occult blood test (FOBT) of the stool. You may have this test every year starting at age 44. Flexible sigmoidoscopy or colonoscopy. You may have a sigmoidoscopy every 5 years or a colonoscopy every 10 years starting at age 98. Prostate cancer screening. Recommendations will vary depending on your family history and other risks. Hepatitis C blood test. Hepatitis B blood test. Sexually transmitted disease (STD) testing. Diabetes screening. This is done by checking your blood sugar (glucose) after you have not eaten for a while (fasting). You may have this done every 1-3 years. Abdominal aortic aneurysm (AAA) screening. You may need this if you are a current or former smoker. Osteoporosis. You may be screened starting at age 43 if you are at high risk. Talk with your health care provider about your test results, treatment options, and if necessary, the need for more tests. Vaccines  Your health care provider may recommend certain vaccines, such as: Influenza vaccine. This is  recommended every year. Tetanus, diphtheria, and acellular pertussis (Tdap, Td) vaccine. You  may need a Td booster every 10 years. Zoster vaccine. You may need this after age 93. Pneumococcal 13-valent conjugate (PCV13) vaccine. One dose is recommended after age 31. Pneumococcal polysaccharide (PPSV23) vaccine. One dose is recommended after age 63. Talk to your health care provider about which screenings and vaccines you need and how often you need them. This information is not intended to replace advice given to you by your health care provider. Make sure you discuss any questions you have with your health care provider. Document Released: 09/29/2015 Document Revised: 05/22/2016 Document Reviewed: 07/04/2015 Elsevier Interactive Patient Education  2017 [employer] Prevention in the Home Falls can cause injuries. They can happen to people of all ages. There are many things you can do to make your home safe and to help prevent falls. What can I do on the outside of my home? Regularly fix the edges of walkways and driveways and fix any cracks. Remove anything that might make you trip as you walk through a door, such as a raised step or threshold. Trim any bushes or trees on the path to your home. Use bright outdoor lighting. Clear any walking paths of anything that might make someone trip, such as rocks or tools. Regularly check to see if handrails are loose or broken. Make sure that both sides of any steps have handrails. Any raised decks and porches should have guardrails on the edges. Have any leaves, snow, or ice cleared regularly. Use sand or salt on walking paths during winter. Clean up any spills in your garage right away. This includes oil or grease spills. What can I do in the bathroom? Use night lights. Install grab bars by the toilet and in the tub and shower. Do not use towel bars as grab bars. Use non-skid mats or decals in the tub or shower. If you need to sit down in the shower, use a plastic, non-slip stool. Keep the floor dry. Clean up any water that spills  on the floor as soon as it happens. Remove soap buildup in the tub or shower regularly. Attach bath mats securely with double-sided non-slip rug tape. Do not have throw rugs and other things on the floor that can make you trip. What can I do in the bedroom? Use night lights. Make sure that you have a light by your bed that is easy to reach. Do not use any sheets or blankets that are too big for your bed. They should not hang down onto the floor. Have a firm chair that has side arms. You can use this for support while you get dressed. Do not have throw rugs and other things on the floor that can make you trip. What can I do in the kitchen? Clean up any spills right away. Avoid walking on wet floors. Keep items that you use a lot in easy-to-reach places. If you need to reach something above you, use a strong step stool that has a grab bar. Keep electrical cords out of the way. Do not use floor polish or wax that makes floors slippery. If you must use wax, use non-skid floor wax. Do not have throw rugs and other things on the floor that can make you trip. What can I do with my stairs? Do not leave any items on the stairs. Make sure that there are handrails on both sides of the stairs and use them. Fix handrails that are  broken or loose. Make sure that handrails are as long as the stairways. Check any carpeting to make sure that it is firmly attached to the stairs. Fix any carpet that is loose or worn. Avoid having throw rugs at the top or bottom of the stairs. If you do have throw rugs, attach them to the floor with carpet tape. Make sure that you have a light switch at the top of the stairs and the bottom of the stairs. If you do not have them, ask someone to add them for you. What else can I do to help prevent falls? Wear shoes that: Do not have high heels. Have rubber bottoms. Are comfortable and fit you well. Are closed at the toe. Do not wear sandals. If you use a stepladder: Make  sure that it is fully opened. Do not climb a closed stepladder. Make sure that both sides of the stepladder are locked into place. Ask someone to hold it for you, if possible. Clearly mark and make sure that you can see: Any grab bars or handrails. First and last steps. Where the edge of each step is. Use tools that help you move around (mobility aids) if they are needed. These include: Canes. Walkers. Scooters. Crutches. Turn on the lights when you go into a dark area. Replace any light bulbs as soon as they burn out. Set up your furniture so you have a clear path. Avoid moving your furniture around. If any of your floors are uneven, fix them. If there are any pets around you, be aware of where they are. Review your medicines with your doctor. Some medicines can make you feel dizzy. This can increase your chance of falling. Ask your doctor what other things that you can do to help prevent falls. This information is not intended to replace advice given to you by your health care provider. Make sure you discuss any questions you have with your health care provider. Document Released: 06/29/2009 Document Revised: 02/08/2016 Document Reviewed: 10/07/2014 Elsevier Interactive Patient Education  2017 [employer].

## 2021-07-19 ENCOUNTER — Other Ambulatory Visit: Payer: Self-pay

## 2021-07-19 DIAGNOSIS — E118 Type 2 diabetes mellitus with unspecified complications: Secondary | ICD-10-CM

## 2021-07-19 MED ORDER — LANTUS SOLOSTAR 100 UNIT/ML ~~LOC~~ SOPN: 25.0000 [IU] | PEN_INJECTOR | Freq: Two times a day (BID) | SUBCUTANEOUS | 0 refills | Status: DC

## 2021-07-25 ENCOUNTER — Other Ambulatory Visit: Payer: Self-pay | Admitting: Primary Care

## 2021-07-25 ENCOUNTER — Other Ambulatory Visit: Payer: Self-pay | Admitting: Family Medicine

## 2021-07-25 DIAGNOSIS — E785 Hyperlipidemia, unspecified: Secondary | ICD-10-CM

## 2021-07-25 DIAGNOSIS — Z794 Long term (current) use of insulin: Secondary | ICD-10-CM

## 2021-07-25 DIAGNOSIS — I10 Essential (primary) hypertension: Secondary | ICD-10-CM

## 2021-07-25 DIAGNOSIS — E119 Type 2 diabetes mellitus without complications: Secondary | ICD-10-CM

## 2021-08-24 ENCOUNTER — Other Ambulatory Visit: Payer: Self-pay

## 2021-08-24 ENCOUNTER — Encounter: Payer: Self-pay | Admitting: Primary Care

## 2021-08-24 ENCOUNTER — Ambulatory Visit (INDEPENDENT_AMBULATORY_CARE_PROVIDER_SITE_OTHER): Payer: Medicare HMO | Admitting: Primary Care

## 2021-08-24 VITALS — BP 126/78 | HR 82 | Temp 97.4°F | Ht 72.0 in | Wt 216.0 lb

## 2021-08-24 DIAGNOSIS — Z23 Encounter for immunization: Secondary | ICD-10-CM | POA: Diagnosis not present

## 2021-08-24 DIAGNOSIS — E118 Type 2 diabetes mellitus with unspecified complications: Secondary | ICD-10-CM

## 2021-08-24 DIAGNOSIS — Z794 Long term (current) use of insulin: Secondary | ICD-10-CM

## 2021-08-24 DIAGNOSIS — E119 Type 2 diabetes mellitus without complications: Secondary | ICD-10-CM

## 2021-08-24 LAB — POCT GLYCOSYLATED HEMOGLOBIN (HGB A1C): Hemoglobin A1C: 9.7 % — AB (ref 4.0–5.6)

## 2021-08-24 MED ORDER — LANTUS SOLOSTAR 100 UNIT/ML ~~LOC~~ SOPN: PEN_INJECTOR | SUBCUTANEOUS | 0 refills | Status: DC

## 2021-08-24 NOTE — Progress Notes (Signed)
Subjective:    Patient ID: Lawrence Parker, male    DOB: 1946-02-17, 75 y.o.   MRN: [id number]  HPI  Lawrence Parker is a very pleasant 75 y.o. male with a history of hypertension, PAD, type 2 diabetes, hyperlipidemia, decreased renal function who presents today for follow up of diabetes.  Current medications include: Metformin 1000 mg BID, Glipizide 10 mg BID, Lantus 25 units BID, Trulicity 6.31 mg weekly.  He is not taking Trulicity as he cannot afford it. He may be able to resume in early 2023 as he is switching insurance companies.   She is checking her blood glucose 1 times daily and is getting readings of:  AM fasting: 120's  Last A1C: 8.7 in September 2022, today 9.7 Last Eye Exam: UTD Last Foot Exam: UTD Pneumonia Vaccination: 2019 Urine Microalbumin: None. ACE-I Statin: Simvastatin   Dietary changes since last visit: Reducing portion sizes.    Exercise: Exercise bike infrequently.   BP Readings from Last 3 Encounters:  08/24/21 126/78  06/06/21 132/70  02/22/21 124/62       Review of Systems  Eyes:  Negative for visual disturbance.  Respiratory:  Negative for shortness of breath.   Cardiovascular:  Negative for chest pain.  Endocrine: Positive for polyuria.  Neurological:  Negative for numbness.        Past Medical History:  Diagnosis Date   Anxiety    takes Xanax prn   Arthritis    neck   Back pain    deteriorating disc   Bladder neck obstruction    Colon polyps    Depression    hx of but doesn't take any medications   Diabetes mellitus    takes Actos,Glipizide,Metformin,and Lantus   Diverticula of colon    History of kidney stones    also has one at present time    Hyperlipidemia    takes Zocor daily   Hypertension    takes Amlodipine daily and HCTZ daily   Neck pain    cervical spondylosis   Peyronie disease     Social History   Socioeconomic History   Marital status: Married    Spouse name: Not on file   Number of  children: Not on file   Years of education: Not on file   Highest education level: Not on file  Occupational History   Occupation: retired  Tobacco Use   Smoking status: Former    Packs/day: 1.00    Types: Cigarettes   Smokeless tobacco: Never   Tobacco comments:    quit smoking 56yr ago  Vaping Use   Vaping Use: Never used  Substance and Sexual Activity   Alcohol use: No   Drug use: No   Sexual activity: Not Currently  Other Topics Concern   Not on file  Social History Narrative   Married.   2 children. 2 grandchildren.   Retired. Drove trucks for 43 years.   Enjoys traveling, spending time with family.   Social Determinants of Health   Financial Resource Strain: Low Risk    Difficulty of Paying Living Expenses: Not hard at all  Food Insecurity: No Food Insecurity   Worried About RCharity fundraiserin the Last Year: Never true   RAdelin the Last Year: Never true  Transportation Needs: No Transportation Needs   Lack of Transportation (Medical): No   Lack of Transportation (Non-Medical): No  Physical Activity: Sufficiently Active   Days of Exercise per Week: 3  days   Minutes of Exercise per Session: 50 min  Stress: No Stress Concern Present   Feeling of Stress : Not at all  Social Connections: Moderately Isolated   Frequency of Communication with Friends and Family: More than three times a week   Frequency of Social Gatherings with Friends and Family: More than three times a week   Attends Religious Services: Never   Marine scientist or Organizations: No   Attends Music therapist: Never   Marital Status: Married  Human resources officer Violence: Not At Risk   Fear of Current or Ex-Partner: No   Emotionally Abused: No   Physically Abused: No   Sexually Abused: No    Past Surgical History:  Procedure Laterality Date   CERVICAL SPINE SURGERY     CIRCUMCISION  20+yrs ago   COLONOSCOPY     NOSE SURGERY  20+yrs ago   SHOULDER SURGERY      right 20+yrs ago    Family History  Problem Relation Age of Onset   Cancer Mother    Diabetes Mother    Cancer Father    Cancer Sister    Diabetes Sister    Cancer Sister    Diabetes Brother    Anesthesia problems Neg Hx    Hypotension Neg Hx    Malignant hyperthermia Neg Hx    Pseudochol deficiency Neg Hx     No Known Allergies  Current Outpatient Medications on File Prior to Visit  Medication Sig Dispense Refill   Alcohol Swabs (DROPSAFE ALCOHOL PREP) 70 % PADS USE AS DIRECTED AS NEEDED 400 each 0   amLODipine (NORVASC) 10 MG tablet TAKE 1 TABLET EVERY DAY FOR BLOOD PRESSURE 90 tablet 3   aspirin 325 MG tablet Take 325 mg by mouth daily.     Blood Glucose Calibration (ACCU-CHEK AVIVA) SOLN Use as instructed. 1 each 1   Blood Glucose Monitoring Suppl (ACCU-CHEK AVIVA PLUS) w/Device KIT Check blood sugar twice a day and as directed. Dx E11.9 1 kit 0   fluticasone (FLONASE) 50 MCG/ACT nasal spray USE 2 SPRAYS IN EACH NOSTRIL EVERY DAY 48 g 1   gabapentin (NEURONTIN) 300 MG capsule Take 1 capsule (300 mg total) by mouth 3 (three) times daily. For pain. 270 capsule 1   glipiZIDE (GLUCOTROL) 10 MG tablet TAKE 1 TABLET TWICE DAILY FOR DIABETES 180 tablet 1   glucose blood (ACCU-CHEK AVIVA PLUS) test strip TEST BLOOD SUGAR TWICE DAILY AS DIRECTED 200 strip 5   hydrochlorothiazide (HYDRODIURIL) 25 MG tablet TAKE 1 TABLET EVERY MORNING FOR BLOOD PRESSURE 90 tablet 3   Insulin Pen Needle 31G X 8 MM MISC Use as directed to injection insulin. 300 each 2   Lancets (ACCU-CHEK SOFT TOUCH) lancets Check blood sugar twice a day and as directed. Dx E11.9 300 each 2   lisinopril (ZESTRIL) 10 MG tablet TAKE 1 TABLET EVERY DAY 90 tablet 1   metFORMIN (GLUCOPHAGE) 500 MG tablet Take 2 tablets (1,000 mg total) by mouth 2 (two) times daily with a meal. For diabetes. 360 tablet 0   simvastatin (ZOCOR) 20 MG tablet Take 1 tablet (20 mg total) by mouth daily. For cholesterol. 90 tablet 1   No current  facility-administered medications on file prior to visit.    BP 126/78   Pulse 82   Temp (!) 97.4 F (36.3 C) (Temporal)   Ht 6' (1.829 m)   Wt 216 lb (98 kg)   BMI 29.29 kg/m  Objective:  Physical Exam Cardiovascular:     Rate and Rhythm: Normal rate and regular rhythm.  Pulmonary:     Effort: Pulmonary effort is normal.     Breath sounds: Normal breath sounds. No wheezing or rales.  Musculoskeletal:     Cervical back: Neck supple.  Skin:    General: Skin is warm and dry.  Neurological:     Mental Status: He is alert and oriented to person, place, and time.          Assessment & Plan:      This visit occurred during the SARS-CoV-2 public health emergency.  Safety protocols were in place, including screening questions prior to the visit, additional usage of staff PPE, and extensive cleaning of exam room while observing appropriate contact time as indicated for disinfecting solutions.

## 2021-08-24 NOTE — Assessment & Plan Note (Signed)
Uncontrolled with increase in A1C to 9.7.  Discussed the need to check glucose readings throughout the day.   Increase Lantus to 30 units in AM and continue 25 units in PM.   Continue Metformin 1000 mg BID, glipizide 10 mg BID.  He will resume Trulicity after the new year and will notify us when ready.   Follow up in 6 weeks with glucose logs.

## 2021-08-24 NOTE — Patient Instructions (Signed)
Start checking your blood sugar levels at least twice daily.  Appropriate times to check your blood sugar levels are:  -Before any meal (breakfast, lunch, dinner) -Two hours after any meal (breakfast, lunch, dinner) -Bedtime  Record your readings and notify me if you continue to consistently run at or above 200 after a few weeks.  We increased your Lantus to 30 units in the morning and continued 25 units in the evening for diabetes.  Message me on MyChart when you are ready to start Trulicity.   We will see you in 6 weeks.  Diabetes Mellitus and Nutrition, Adult When you have diabetes, or diabetes mellitus, it is very important to have healthy eating habits because your blood sugar (glucose) levels are greatly affected by what you eat and drink. Eating healthy foods in the right amounts, at about the same times every day, can help you: Manage your blood glucose. Lower your risk of heart disease. Improve your blood pressure. Reach or maintain a healthy weight. What can affect my meal plan? Every person with diabetes is different, and each person has different needs for a meal plan. Your health care provider may recommend that you work with a dietitian to make a meal plan that is best for you. Your meal plan may vary depending on factors such as: The calories you need. The medicines you take. Your weight. Your blood glucose, blood pressure, and cholesterol levels. Your activity level. Other health conditions you have, such as heart or kidney disease. How do carbohydrates affect me? Carbohydrates, also called carbs, affect your blood glucose level more than any other type of food. Eating carbs raises the amount of glucose in your blood. It is important to know how many carbs you can safely have in each meal. This is different for every person. Your dietitian can help you calculate how many carbs you should have at each meal and for each snack. How does alcohol affect me? Alcohol can  cause a decrease in blood glucose (hypoglycemia), especially if you use insulin or take certain diabetes medicines by mouth. Hypoglycemia can be a life-threatening condition. Symptoms of hypoglycemia, such as sleepiness, dizziness, and confusion, are similar to symptoms of having too much alcohol. Do not drink alcohol if: Your health care provider tells you not to drink. You are pregnant, may be pregnant, or are planning to become pregnant. If you drink alcohol: Limit how much you have to: 0-1 drink a day for women. 0-2 drinks a day for men. Know how much alcohol is in your drink. In the [place] one drink equals one 12 oz bottle of beer (355 mL), one 5 oz glass of wine (148 mL), or one 1 oz glass of hard liquor (44 mL). Keep yourself hydrated with water, diet soda, or unsweetened iced tea. Keep in mind that regular soda, juice, and other mixers may contain a lot of sugar and must be counted as carbs. What are tips for following this plan? Reading food labels Start by checking the serving size on the Nutrition Facts label of packaged foods and drinks. The number of calories and the amount of carbs, fats, and other nutrients listed on the label are based on one serving of the item. Many items contain more than one serving per package. Check the total grams (g) of carbs in one serving. Check the number of grams of saturated fats and trans fats in one serving. Choose foods that have a low amount or none of these fats. Check the number  of milligrams (mg) of salt (sodium) in one serving. Most people should limit total sodium intake to less than 2,300 mg per day. Always check the nutrition information of foods labeled as "low-fat" or "nonfat." These foods may be higher in added sugar or refined carbs and should be avoided. Talk to your dietitian to identify your daily goals for nutrients listed on the label. Shopping Avoid buying canned, pre-made, or processed foods. These foods tend to be high in fat,  sodium, and added sugar. Shop around the outside edge of the grocery store. This is where you will most often find fresh fruits and vegetables, bulk grains, fresh meats, and fresh dairy products. Cooking Use low-heat cooking methods, such as baking, instead of high-heat cooking methods, such as deep frying. Cook using healthy oils, such as olive, canola, or sunflower oil. Avoid cooking with butter, cream, or high-fat meats. Meal planning Eat meals and snacks regularly, preferably at the same times every day. Avoid going long periods of time without eating. Eat foods that are high in fiber, such as fresh fruits, vegetables, beans, and whole grains. Eat 4-6 oz (112-168 g) of lean protein each day, such as lean meat, chicken, fish, eggs, or tofu. One ounce (oz) (28 g) of lean protein is equal to: 1 oz (28 g) of meat, chicken, or fish. 1 egg.  cup (62 g) of tofu. Eat some foods each day that contain healthy fats, such as avocado, nuts, seeds, and fish. What foods should I eat? Fruits Berries. Apples. Oranges. Peaches. Apricots. Plums. Grapes. Mangoes. Papayas. Pomegranates. Kiwi. Cherries. Vegetables Leafy greens, including lettuce, spinach, kale, chard, collard greens, mustard greens, and cabbage. Beets. Cauliflower. Broccoli. Carrots. Green beans. Tomatoes. Peppers. Onions. Cucumbers. Brussels sprouts. Grains Whole grains, such as whole-wheat or whole-grain bread, crackers, tortillas, cereal, and pasta. Unsweetened oatmeal. Quinoa. Brown or wild rice. Meats and other proteins Seafood. Poultry without skin. Lean cuts of poultry and beef. Tofu. Nuts. Seeds. Dairy Low-fat or fat-free dairy products such as milk, yogurt, and cheese. The items listed above may not be a complete list of foods and beverages you can eat and drink. Contact a dietitian for more information. What foods should I avoid? Fruits Fruits canned with syrup. Vegetables Canned vegetables. Frozen vegetables with butter or  cream sauce. Grains Refined white flour and flour products such as bread, pasta, snack foods, and cereals. Avoid all processed foods. Meats and other proteins Fatty cuts of meat. Poultry with skin. Breaded or fried meats. Processed meat. Avoid saturated fats. Dairy Full-fat yogurt, cheese, or milk. Beverages Sweetened drinks, such as soda or iced tea. The items listed above may not be a complete list of foods and beverages you should avoid. Contact a dietitian for more information. Questions to ask a health care provider Do I need to meet with a certified diabetes care and education specialist? Do I need to meet with a dietitian? What number can I call if I have questions? When are the best times to check my blood glucose? Where to find more information: American Diabetes Association: diabetes.org Academy of Nutrition and Dietetics: eatright.[employer] of Diabetes and Digestive and Kidney Diseases: [employer] Association of Diabetes Care & Education Specialists: diabeteseducator.org Summary It is important to have healthy eating habits because your blood sugar (glucose) levels are greatly affected by what you eat and drink. It is important to use alcohol carefully. A healthy meal plan will help you manage your blood glucose and lower your risk of heart disease. Your health  care provider may recommend that you work with a dietitian to make a meal plan that is best for you. This information is not intended to replace advice given to you by your health care provider. Make sure you discuss any questions you have with your health care provider. Document Revised: 04/05/2020 Document Reviewed: 04/05/2020 Elsevier Patient Education  [place].

## 2021-10-05 ENCOUNTER — Ambulatory Visit (INDEPENDENT_AMBULATORY_CARE_PROVIDER_SITE_OTHER): Payer: HMO | Admitting: Primary Care

## 2021-10-05 ENCOUNTER — Other Ambulatory Visit: Payer: Self-pay

## 2021-10-05 VITALS — BP 138/64 | HR 99 | Temp 97.6°F | Ht 72.0 in | Wt 218.4 lb

## 2021-10-05 DIAGNOSIS — E119 Type 2 diabetes mellitus without complications: Secondary | ICD-10-CM

## 2021-10-05 DIAGNOSIS — Z794 Long term (current) use of insulin: Secondary | ICD-10-CM | POA: Diagnosis not present

## 2021-10-05 DIAGNOSIS — E118 Type 2 diabetes mellitus with unspecified complications: Secondary | ICD-10-CM

## 2021-10-05 LAB — POCT CBG (FASTING - GLUCOSE)-MANUAL ENTRY: Glucose Fasting, POC: 89 mg/dL (ref 70–99)

## 2021-10-05 MED ORDER — METFORMIN HCL 500 MG PO TABS: 1000.0000 mg | ORAL_TABLET | Freq: Two times a day (BID) | ORAL | 1 refills | Status: DC

## 2021-10-05 MED ORDER — INSULIN PEN NEEDLE 31G X 8 MM MISC: 3 refills | Status: AC

## 2021-10-05 NOTE — Assessment & Plan Note (Addendum)
Overall seems to be improving, but this is difficult to tell for sure as he has now glucose readings with him today and is only checking glucose in the morning fasting.  Discussed to start rotating times of glucose checks and to notify me if he is consistently at or above 150.    Continue to work on diet.  Continue Lantus 30 units in AM and 25 units in PM. Continue metformin XR 1000 mg BID. He will notify when he starts Trulicity. Offered to see if he qualifies for patient assistance, he declines.   Follow up in 2 months.

## 2021-10-05 NOTE — Progress Notes (Signed)
Subjective:    Patient ID: Lawrence Parker, male    DOB: [birth year], 76 y.o.   MRN: [id number]  HPI  Lawrence Parker is a very pleasant 76 y.o. male with a history of hypertension, type 2 diabetes, hyperlipidemia, PAD who presents today for follow up of diabetes.  Current medications include: Metformin XR 1000 mg BID, Glipizide 10 mg BID, Lantus 30 units in AM and 25 units nightly, Trulicity 0.53 mg weekly.  During his last visit he endorsed not using Trulicity due to cost. He was going to resume in early 2023. Today he endorses that he has not resumed Trulicity due to cost.   He is checking his blood glucose 1 times daily (only fasting in the morning) and is getting readings of low 100's.   Highest reading was 146.  He does not have his glucose logs or his meter today.   Last A1C: 9.7 in December 2022. Glucose reading today in office: 89 Last Eye Exam: UTD Last Foot Exam: UTD Pneumonia Vaccination: 2019 Urine Microalbumin: None. Ace-I Statin: Simvastatin   Dietary changes since last visit: He has been cutting back portion sizes.    Exercise: None.   Wt Readings from Last 3 Encounters:  10/05/21 218 lb 7 oz (99.1 kg)  08/24/21 216 lb (98 kg)  07/10/21 220 lb (99.8 kg)   BP Readings from Last 3 Encounters:  10/05/21 138/64  08/24/21 126/78  06/06/21 132/70       Review of Systems  Respiratory:  Negative for shortness of breath.   Cardiovascular:  Negative for chest pain.  Neurological:  Negative for dizziness, numbness and headaches.        Past Medical History:  Diagnosis Date   Anxiety    takes Xanax prn   Arthritis    neck   Back pain    deteriorating disc   Bladder neck obstruction    Colon polyps    Depression    hx of but doesn't take any medications   Diabetes mellitus    takes Actos,Glipizide,Metformin,and Lantus   Diverticula of colon    History of kidney stones    also has one at present time    Hyperlipidemia    takes Zocor daily    Hypertension    takes Amlodipine daily and HCTZ daily   Neck pain    cervical spondylosis   Peyronie disease     Social History   Socioeconomic History   Marital status: Married    Spouse name: Not on file   Number of children: Not on file   Years of education: Not on file   Highest education level: Not on file  Occupational History   Occupation: retired  Tobacco Use   Smoking status: Former    Packs/day: 1.00    Types: Cigarettes   Smokeless tobacco: Never   Tobacco comments:    quit smoking 13yr ago  Vaping Use   Vaping Use: Never used  Substance and Sexual Activity   Alcohol use: No   Drug use: No   Sexual activity: Not Currently  Other Topics Concern   Not on file  Social History Narrative   Married.   2 children. 2 grandchildren.   Retired. Drove trucks for 43 years.   Enjoys traveling, spending time with family.   Social Determinants of Health   Financial Resource Strain: Low Risk    Difficulty of Paying Living Expenses: Not hard at all  Food Insecurity: No Food Insecurity  Worried About Charity fundraiser in the Last Year: Never true   [place] in the Last Year: Never true  Transportation Needs: No Transportation Needs   Lack of Transportation (Medical): No   Lack of Transportation (Non-Medical): No  Physical Activity: Sufficiently Active   Days of Exercise per Week: 3 days   Minutes of Exercise per Session: 50 min  Stress: No Stress Concern Present   Feeling of Stress : Not at all  Social Connections: Moderately Isolated   Frequency of Communication with Friends and Family: More than three times a week   Frequency of Social Gatherings with Friends and Family: More than three times a week   Attends Religious Services: Never   Marine scientist or Organizations: No   Attends Music therapist: Never   Marital Status: Married  Human resources officer Violence: Not At Risk   Fear of Current or Ex-Partner: No   Emotionally Abused:  No   Physically Abused: No   Sexually Abused: No    Past Surgical History:  Procedure Laterality Date   CERVICAL SPINE SURGERY     CIRCUMCISION  20+yrs ago   COLONOSCOPY     NOSE SURGERY  20+yrs ago   SHOULDER SURGERY     right 20+yrs ago    Family History  Problem Relation Age of Onset   Cancer Mother    Diabetes Mother    Cancer Father    Cancer Sister    Diabetes Sister    Cancer Sister    Diabetes Brother    Anesthesia problems Neg Hx    Hypotension Neg Hx    Malignant hyperthermia Neg Hx    Pseudochol deficiency Neg Hx     No Known Allergies  Current Outpatient Medications on File Prior to Visit  Medication Sig Dispense Refill   Alcohol Swabs (DROPSAFE ALCOHOL PREP) 70 % PADS USE AS DIRECTED AS NEEDED 400 each 0   amLODipine (NORVASC) 10 MG tablet TAKE 1 TABLET EVERY DAY FOR BLOOD PRESSURE 90 tablet 3   aspirin 325 MG tablet Take 325 mg by mouth daily.     Blood Glucose Calibration (ACCU-CHEK AVIVA) SOLN Use as instructed. 1 each 1   Blood Glucose Monitoring Suppl (ACCU-CHEK AVIVA PLUS) w/Device KIT Check blood sugar twice a day and as directed. Dx E11.9 1 kit 0   fluticasone (FLONASE) 50 MCG/ACT nasal spray USE 2 SPRAYS IN EACH NOSTRIL EVERY DAY 48 g 1   gabapentin (NEURONTIN) 300 MG capsule Take 1 capsule (300 mg total) by mouth 3 (three) times daily. For pain. 270 capsule 1   glipiZIDE (GLUCOTROL) 10 MG tablet TAKE 1 TABLET TWICE DAILY FOR DIABETES 180 tablet 1   glucose blood (ACCU-CHEK AVIVA PLUS) test strip TEST BLOOD SUGAR TWICE DAILY AS DIRECTED 200 strip 5   hydrochlorothiazide (HYDRODIURIL) 25 MG tablet TAKE 1 TABLET EVERY MORNING FOR BLOOD PRESSURE 90 tablet 3   insulin glargine (LANTUS SOLOSTAR) 100 UNIT/ML Solostar Pen Inject 30 units in the morning and 25 units in the evening for diabetes. 60 mL 0   Insulin Pen Needle 31G X 8 MM MISC Use as directed to injection insulin. 300 each 2   Lancets (ACCU-CHEK SOFT TOUCH) lancets Check blood sugar twice a day  and as directed. Dx E11.9 300 each 2   lisinopril (ZESTRIL) 10 MG tablet TAKE 1 TABLET EVERY DAY 90 tablet 1   simvastatin (ZOCOR) 20 MG tablet Take 1 tablet (20 mg total) by mouth  daily. For cholesterol. 90 tablet 1   No current facility-administered medications on file prior to visit.    BP 138/64 (BP Location: Right Arm, Patient Position: Sitting, Cuff Size: Normal)    Pulse 99    Temp 97.6 F (36.4 C) (Temporal)    Ht 6' (1.829 m)    Wt 218 lb 7 oz (99.1 kg)    SpO2 96%    BMI 29.63 kg/m  Objective:   Physical Exam Cardiovascular:     Rate and Rhythm: Normal rate and regular rhythm.  Pulmonary:     Effort: Pulmonary effort is normal.     Breath sounds: Normal breath sounds. No wheezing or rales.  Musculoskeletal:     Cervical back: Neck supple.  Skin:    General: Skin is warm and dry.  Neurological:     Mental Status: He is alert and oriented to person, place, and time.          Assessment & Plan:      This visit occurred during the SARS-CoV-2 public health emergency.  Safety protocols were in place, including screening questions prior to the visit, additional usage of staff PPE, and extensive cleaning of exam room while observing appropriate contact time as indicated for disinfecting solutions.

## 2021-10-05 NOTE — Addendum Note (Signed)
Addended by: Pleas Koch on: 10/05/2021 09:27 AM   Modules accepted: Orders

## 2021-10-05 NOTE — Patient Instructions (Signed)
Start checking your blood sugar levels at different times of the day. Appropriate times to check your blood sugar levels are:  -Before any meal (breakfast, lunch, dinner) -Two hours after any meal (breakfast, lunch, dinner) -Bedtime  Record your readings and notify me if you continue to consistently run at or above 150 after a few weeks.  The pharmacist will call you regarding patient assistance and Trulicity.   Continue to work on [employer].  Schedule a follow up visit for 2 months.  It was a pleasure to see you today!

## 2021-10-09 DIAGNOSIS — H35371 Puckering of macula, right eye: Secondary | ICD-10-CM | POA: Diagnosis not present

## 2021-10-09 DIAGNOSIS — H43813 Vitreous degeneration, bilateral: Secondary | ICD-10-CM | POA: Diagnosis not present

## 2021-10-09 DIAGNOSIS — E113313 Type 2 diabetes mellitus with moderate nonproliferative diabetic retinopathy with macular edema, bilateral: Secondary | ICD-10-CM | POA: Diagnosis not present

## 2021-10-09 DIAGNOSIS — H40051 Ocular hypertension, right eye: Secondary | ICD-10-CM | POA: Diagnosis not present

## 2021-11-19 DIAGNOSIS — H43813 Vitreous degeneration, bilateral: Secondary | ICD-10-CM | POA: Diagnosis not present

## 2021-11-19 DIAGNOSIS — H35371 Puckering of macula, right eye: Secondary | ICD-10-CM | POA: Diagnosis not present

## 2021-11-19 DIAGNOSIS — H40051 Ocular hypertension, right eye: Secondary | ICD-10-CM | POA: Diagnosis not present

## 2021-11-19 DIAGNOSIS — E113313 Type 2 diabetes mellitus with moderate nonproliferative diabetic retinopathy with macular edema, bilateral: Secondary | ICD-10-CM | POA: Diagnosis not present

## 2021-12-04 ENCOUNTER — Ambulatory Visit: Payer: HMO | Admitting: Primary Care

## 2021-12-07 ENCOUNTER — Encounter: Payer: Self-pay | Admitting: Primary Care

## 2021-12-07 ENCOUNTER — Other Ambulatory Visit: Payer: Self-pay

## 2021-12-07 ENCOUNTER — Ambulatory Visit (INDEPENDENT_AMBULATORY_CARE_PROVIDER_SITE_OTHER): Payer: HMO | Admitting: Primary Care

## 2021-12-07 VITALS — BP 140/62 | HR 65 | Ht 72.0 in | Wt 214.2 lb

## 2021-12-07 DIAGNOSIS — E118 Type 2 diabetes mellitus with unspecified complications: Secondary | ICD-10-CM | POA: Diagnosis not present

## 2021-12-07 DIAGNOSIS — Z794 Long term (current) use of insulin: Secondary | ICD-10-CM | POA: Diagnosis not present

## 2021-12-07 DIAGNOSIS — M792 Neuralgia and neuritis, unspecified: Secondary | ICD-10-CM | POA: Diagnosis not present

## 2021-12-07 DIAGNOSIS — I10 Essential (primary) hypertension: Secondary | ICD-10-CM | POA: Diagnosis not present

## 2021-12-07 DIAGNOSIS — E1165 Type 2 diabetes mellitus with hyperglycemia: Secondary | ICD-10-CM

## 2021-12-07 DIAGNOSIS — E785 Hyperlipidemia, unspecified: Secondary | ICD-10-CM

## 2021-12-07 DIAGNOSIS — E119 Type 2 diabetes mellitus without complications: Secondary | ICD-10-CM

## 2021-12-07 LAB — POCT GLYCOSYLATED HEMOGLOBIN (HGB A1C): Hemoglobin A1C: 8.5 % — AB (ref 4.0–5.6)

## 2021-12-07 MED ORDER — SIMVASTATIN 20 MG PO TABS: 20.0000 mg | ORAL_TABLET | Freq: Every day | ORAL | 0 refills | Status: DC

## 2021-12-07 MED ORDER — TIRZEPATIDE 2.5 MG/0.5ML ~~LOC~~ SOAJ: 2.5000 mg | SUBCUTANEOUS | 0 refills | Status: DC

## 2021-12-07 MED ORDER — AMLODIPINE BESYLATE 10 MG PO TABS: ORAL_TABLET | ORAL | 0 refills | Status: DC

## 2021-12-07 MED ORDER — GLIPIZIDE 10 MG PO TABS: ORAL_TABLET | ORAL | 0 refills | Status: DC

## 2021-12-07 MED ORDER — HYDROCHLOROTHIAZIDE 25 MG PO TABS: ORAL_TABLET | ORAL | 0 refills | Status: DC

## 2021-12-07 MED ORDER — METFORMIN HCL 500 MG PO TABS: 1000.0000 mg | ORAL_TABLET | Freq: Two times a day (BID) | ORAL | 0 refills | Status: DC

## 2021-12-07 MED ORDER — GABAPENTIN 300 MG PO CAPS: 300.0000 mg | ORAL_CAPSULE | Freq: Three times a day (TID) | ORAL | 0 refills | Status: DC

## 2021-12-07 NOTE — Progress Notes (Signed)
? ?Subjective:  ? ? Patient ID: Lawrence Parker, male    DOB: 20-May-1946, 76 y.o.   MRN: [id number] ? ?HPI ? ?[name] is a very pleasant 76 y.o. male with a history of uncontrolled type 2 diabetes, hyperlipidemia, hypertension, PAD, decreased renal function who presents today for follow-up of diabetes. He is also needing several [facility]. No longer using [facility]. ? ?Current medications include: Glipizide 10 mg twice daily, metformin 1000 mg twice daily, Lantus insulin 30 units in the morning and 25 units at night.  ? ?He's actually been injecting 25 units of Lantus twice daily.  ? ?Previously prescribed Trulicity, but this has been too costly. He's not used Truclicity 3 months.  ? ?He is checking his blood glucose 1 times daily and is getting readings of ? ?Last A1C: 9.7, in December 2022, 8.5 today ?Last Eye Exam: Due ?Last Foot Exam: UTD ?Pneumonia Vaccination: 2019 ?Urine Microalbumin: None. Managed on ACE-I ?Statin: Simvastatin ? ?Dietary changes since last visit: None. He's not made any changes in his diet.  ? ? ?Exercise: No exercise.  ? ?BP Readings from Last 3 Encounters:  ?12/07/21 140/62  ?10/05/21 138/64  ?08/24/21 126/78  ? ? ? ? ? ? ? ?Review of Systems  ?Eyes:  Negative for visual disturbance.  ?Respiratory:  Negative for shortness of breath.   ?Cardiovascular:  Negative for chest pain.  ?Neurological:  Negative for dizziness and numbness.  ? ?   ? ? ?Past Medical History:  ?Diagnosis Date  ? Anxiety   ? takes Xanax prn  ? Arthritis   ? neck  ? Back pain   ? deteriorating disc  ? Bladder neck obstruction   ? Colon polyps   ? Depression   ? hx of but doesn't take any medications  ? Diabetes mellitus   ? takes Actos,Glipizide,Metformin,and Lantus  ? Diverticula of colon   ? History of kidney stones   ? also has one at present time   ? Hyperlipidemia   ? takes Zocor daily  ? Hypertension   ? takes Amlodipine daily and HCTZ daily  ? Neck pain   ? cervical spondylosis   ? Peyronie disease   ? ? ?Social History  ? ?Socioeconomic History  ? Marital status: Married  ?  Spouse name: Not on file  ? Number of children: Not on file  ? Years of education: Not on file  ? Highest education level: Not on file  ?Occupational History  ? Occupation: retired  ?Tobacco Use  ? Smoking status: Former  ?  Packs/day: 1.00  ?  Types: Cigarettes  ? Smokeless tobacco: Never  ? Tobacco comments:  ?  quit smoking 6yr ago  ?Vaping Use  ? Vaping Use: Never used  ?Substance and Sexual Activity  ? Alcohol use: No  ? Drug use: No  ? Sexual activity: Not Currently  ?Other Topics Concern  ? Not on file  ?Social History Narrative  ? Married.  ? 2 children. 2 grandchildren.  ? Retired. Drove trucks for 43 years.  ? Enjoys traveling, spending time with family.  ? ?Social Determinants of Health  ? ?Financial Resource Strain: Low Risk   ? Difficulty of Paying Living Expenses: Not hard at all  ?Food Insecurity: No Food Insecurity  ? Worried About RCharity fundraiserin the Last Year: Never true  ? Ran Out of Food in the Last Year: Never true  ?Transportation Needs: No Transportation Needs  ? Lack  of Transportation (Medical): No  ? Lack of Transportation (Non-Medical): No  ?Physical Activity: Sufficiently Active  ? Days of Exercise per Week: 3 days  ? Minutes of Exercise per Session: 50 min  ?Stress: No Stress Concern Present  ? Feeling of Stress : Not at all  ?Social Connections: Moderately Isolated  ? Frequency of Communication with Friends and Family: More than three times a week  ? Frequency of Social Gatherings with Friends and Family: More than three times a week  ? Attends Religious Services: Never  ? Active Member of Clubs or Organizations: No  ? Attends Archivist Meetings: Never  ? Marital Status: Married  ?Intimate Partner Violence: Not At Risk  ? Fear of Current or Ex-Partner: No  ? Emotionally Abused: No  ? Physically Abused: No  ? Sexually Abused: No  ? ? ?Past Surgical History:  ?Procedure  Laterality Date  ? CERVICAL SPINE SURGERY    ? CIRCUMCISION  20+yrs ago  ? COLONOSCOPY    ? NOSE SURGERY  20+yrs ago  ? SHOULDER SURGERY    ? right 20+yrs ago  ? ? ?Family History  ?Problem Relation Age of Onset  ? Cancer Mother   ? Diabetes Mother   ? Cancer Father   ? Cancer Sister   ? Diabetes Sister   ? Cancer Sister   ? Diabetes Brother   ? Anesthesia problems Neg Hx   ? Hypotension Neg Hx   ? Malignant hyperthermia Neg Hx   ? Pseudochol deficiency Neg Hx   ? ? ?No Known Allergies ? ?Current Outpatient Medications on File Prior to Visit  ?Medication Sig Dispense Refill  ? Alcohol Swabs (DROPSAFE ALCOHOL PREP) 70 % PADS USE AS DIRECTED AS NEEDED 400 each 0  ? amLODipine (NORVASC) 10 MG tablet TAKE 1 TABLET EVERY DAY FOR BLOOD PRESSURE 90 tablet 3  ? aspirin 325 MG tablet Take 325 mg by mouth daily.    ? Blood Glucose Calibration (ACCU-CHEK AVIVA) SOLN Use as instructed. 1 each 1  ? Blood Glucose Monitoring Suppl (ACCU-CHEK AVIVA PLUS) w/Device KIT Check blood sugar twice a day and as directed. Dx E11.9 1 kit 0  ? fluticasone (FLONASE) 50 MCG/ACT nasal spray USE 2 SPRAYS IN EACH NOSTRIL EVERY DAY 48 g 1  ? gabapentin (NEURONTIN) 300 MG capsule Take 1 capsule (300 mg total) by mouth 3 (three) times daily. For pain. 270 capsule 1  ? glipiZIDE (GLUCOTROL) 10 MG tablet TAKE 1 TABLET TWICE DAILY FOR DIABETES 180 tablet 1  ? glucose blood (ACCU-CHEK AVIVA PLUS) test strip TEST BLOOD SUGAR TWICE DAILY AS DIRECTED 200 strip 5  ? hydrochlorothiazide (HYDRODIURIL) 25 MG tablet TAKE 1 TABLET EVERY MORNING FOR BLOOD PRESSURE 90 tablet 3  ? insulin glargine (LANTUS SOLOSTAR) 100 UNIT/ML Solostar Pen Inject 30 units in the morning and 25 units in the evening for diabetes. 60 mL 0  ? Insulin Pen Needle 31G X 8 MM MISC Use as directed to injection insulin. 200 each 3  ? Lancets (ACCU-CHEK SOFT TOUCH) lancets Check blood sugar twice a day and as directed. Dx E11.9 300 each 2  ? lisinopril (ZESTRIL) 10 MG tablet TAKE 1 TABLET  EVERY DAY 90 tablet 1  ? metFORMIN (GLUCOPHAGE) 500 MG tablet Take 2 tablets (1,000 mg total) by mouth 2 (two) times daily with a meal. For diabetes. 360 tablet 1  ? simvastatin (ZOCOR) 20 MG tablet Take 1 tablet (20 mg total) by mouth daily. For cholesterol. 90 tablet 1  ? ?  No current facility-administered medications on file prior to visit.  ? ? ?BP 140/62   Pulse 65   Ht 6' (1.829 m)   Wt 214 lb 3.2 oz (97.2 kg)   SpO2 96%   BMI 29.05 kg/m?  ?Objective:  ? Physical Exam ?Cardiovascular:  ?   Rate and Rhythm: Normal rate and regular rhythm.  ?Pulmonary:  ?   Effort: Pulmonary effort is normal.  ?   Breath sounds: Normal breath sounds. No wheezing or rales.  ?Musculoskeletal:  ?   Cervical back: Neck supple.  ?Skin: ?   General: Skin is warm and dry.  ?Neurological:  ?   Mental Status: He is alert and oriented to person, place, and time.  ? ? ? ? ? ?   ?Assessment & Plan:  ? ? ? ? ?This visit occurred during the SARS-CoV-2 public health emergency.  Safety protocols were in place, including screening questions prior to the visit, additional usage of staff PPE, and extensive cleaning of exam room while observing appropriate contact time as indicated for disinfecting solutions.  ?

## 2021-12-07 NOTE — Assessment & Plan Note (Signed)
Above goal today and borderline during last visit.  ? ?It does not appear that he is taking lisinopril, he does not have his medication bottles with him today. ? ?Continue amlodipine 10 mg daily, HCTZ 25 mg daily.   ? ?I asked that he check his medication bottles at home to see if he has lisinopril.  I also asked that he obtain a blood pressure cuff so that he can monitor BP at home. ?

## 2021-12-07 NOTE — Assessment & Plan Note (Signed)
Improved but still above goal with A1c today of 8.5. ? ?He did not adjust his insulin as we discussed last visit. ? ?Continue glipizide 10 mg twice daily, metformin 1000 mg twice daily. ? ?Add Mounjaro 2.5 mg weekly, coupon card provided today.  Will increase to 0.5 mg weekly after 1 month, patient will call to notify me when he is ready for this dose increase.  We discussed potential side effects of Mounjaro. ? ?Continue Lantus 25 units twice daily for now as we will be adding Mounjaro. ? ?Encouraged him to rotate times of checks for his glucose readings, commended him on checking daily.  New glucose log sheets provided today ? ?Follow-up in 3 months. ?

## 2021-12-07 NOTE — Patient Instructions (Signed)
Start Mounjaro medication for diabetes.  Inject the contents of 1 pen into the skin once a week. ? ?We will start with Mounjaro 2.5 mg weekly for a total of 4 weeks, call me when you use your last pen so that I can increase your dose to 5 mg thereafter. ? ?Continue to check your blood sugars, rotate the time of day that you check.  Notify me if you can continue to see blood sugar readings consistently above 150. ? ?Check your bottles at home to see if you are taking lisinopril for blood pressure. ? ?Schedule your physical for 3 months. ? ?It was a pleasure to see you today! ? ? ?

## 2022-01-09 ENCOUNTER — Other Ambulatory Visit: Payer: Self-pay | Admitting: Primary Care

## 2022-01-09 DIAGNOSIS — E1165 Type 2 diabetes mellitus with hyperglycemia: Secondary | ICD-10-CM

## 2022-01-11 NOTE — Telephone Encounter (Signed)
Left message to call back  

## 2022-01-14 NOTE — Telephone Encounter (Signed)
[place] [facility] - Client ?Nonclinical Telephone Record  ?[facility]? ?Client [facility] [facility] - Client ?Client Site [place] ?Provider Alma Friendly - NP ?Contact Type Call ?Who Is Calling Patient / Member / Family / Caregiver ?Caller Name Cauy Melody ?Caller Phone Number [phone] ?Call Type Message Only Information Provided ?Reason for Call Returning a Call from the Office ?Initial Comment Caller states that they are returning a phone call. ?Additional Comment Office hours provided. ?Disp. Time Disposition Final User ?01/11/2022 6:10:08 PM General Information Provided Yes [place], [place] ?Call Closed By: Mitchell Heir ?Transaction Date/Time: 01/11/2022 6:07:25 PM (ET ?

## 2022-01-14 NOTE — Telephone Encounter (Signed)
Patient called back states that he feels like it is working at dose that he is on. Would like to stay at this dose one more month. I have called in one month refill at patient request.  ?

## 2022-02-18 ENCOUNTER — Telehealth: Payer: Self-pay

## 2022-02-18 DIAGNOSIS — E1165 Type 2 diabetes mellitus with hyperglycemia: Secondary | ICD-10-CM

## 2022-02-18 NOTE — Telephone Encounter (Signed)
MEDICATION:tirzepatide (MOUNJARO) 2.5 MG/0.5ML Pen  PHARMACY: [id number] [place], [place] - [address]  Comments: Patient does not have any left.   **Let patient know to contact pharmacy at the end of the day to make sure medication is ready. **  ** Please notify patient to allow 48-72 hours to process**  **Encourage patient to contact the pharmacy for refills or they can [facility]*

## 2022-02-19 MED ORDER — TIRZEPATIDE 5 MG/0.5ML ~~LOC~~ SOAJ: 5.0000 mg | SUBCUTANEOUS | 0 refills | Status: DC

## 2022-02-19 NOTE — Addendum Note (Signed)
Addended by: Pleas Koch on: 02/19/2022 01:38 PM   Modules accepted: Orders

## 2022-02-19 NOTE — Telephone Encounter (Addendum)
Please notify patient that I will be sending in an increased dose of his Mounjaro diabetes medication.  He will start 5 mg once weekly.  We will see him back in a few weeks

## 2022-02-19 NOTE — Telephone Encounter (Signed)
Called patient informed of change in dose will call if any questions.

## 2022-03-04 ENCOUNTER — Other Ambulatory Visit: Payer: Self-pay | Admitting: Primary Care

## 2022-03-04 DIAGNOSIS — E119 Type 2 diabetes mellitus without complications: Secondary | ICD-10-CM

## 2022-03-04 DIAGNOSIS — I10 Essential (primary) hypertension: Secondary | ICD-10-CM

## 2022-03-04 DIAGNOSIS — E785 Hyperlipidemia, unspecified: Secondary | ICD-10-CM

## 2022-03-12 ENCOUNTER — Ambulatory Visit (INDEPENDENT_AMBULATORY_CARE_PROVIDER_SITE_OTHER): Payer: HMO | Admitting: Primary Care

## 2022-03-12 VITALS — BP 140/82 | HR 74 | Temp 97.6°F | Ht 72.0 in | Wt 207.0 lb

## 2022-03-12 DIAGNOSIS — E785 Hyperlipidemia, unspecified: Secondary | ICD-10-CM

## 2022-03-12 DIAGNOSIS — I10 Essential (primary) hypertension: Secondary | ICD-10-CM

## 2022-03-12 DIAGNOSIS — Z0000 Encounter for general adult medical examination without abnormal findings: Secondary | ICD-10-CM

## 2022-03-12 DIAGNOSIS — R600 Localized edema: Secondary | ICD-10-CM

## 2022-03-12 DIAGNOSIS — Z794 Long term (current) use of insulin: Secondary | ICD-10-CM

## 2022-03-12 DIAGNOSIS — M1712 Unilateral primary osteoarthritis, left knee: Secondary | ICD-10-CM | POA: Diagnosis not present

## 2022-03-12 DIAGNOSIS — E1165 Type 2 diabetes mellitus with hyperglycemia: Secondary | ICD-10-CM

## 2022-03-12 DIAGNOSIS — M792 Neuralgia and neuritis, unspecified: Secondary | ICD-10-CM

## 2022-03-12 DIAGNOSIS — G479 Sleep disorder, unspecified: Secondary | ICD-10-CM | POA: Diagnosis not present

## 2022-03-12 LAB — LIPID PANEL
Cholesterol: 102 mg/dL (ref 0–200)
HDL: 38.7 mg/dL — ABNORMAL LOW (ref >39.00)
LDL Cholesterol: 35 mg/dL (ref 0–99)
NonHDL: 62.83
Total CHOL/HDL Ratio: 3
Triglycerides: 141 mg/dL (ref 0.0–149.0)
VLDL: 28.2 mg/dL (ref 0.0–40.0)

## 2022-03-12 LAB — CBC
HCT: 38.7 % — ABNORMAL LOW (ref 39.0–52.0)
Hemoglobin: 13.2 g/dL (ref 13.0–17.0)
MCHC: 34 g/dL (ref 30.0–36.0)
MCV: 85 fl (ref 78.0–100.0)
Platelets: 193 10*3/uL (ref 150.0–400.0)
RBC: 4.56 Mil/uL (ref 4.22–5.81)
RDW: 13.8 % (ref 11.5–15.5)
WBC: 7.3 10*3/uL (ref 4.0–10.5)

## 2022-03-12 LAB — COMPREHENSIVE METABOLIC PANEL
ALT: 15 U/L (ref 0–53)
AST: 16 U/L (ref 0–37)
Albumin: 4.4 g/dL (ref 3.5–5.2)
Alkaline Phosphatase: 44 U/L (ref 39–117)
BUN: 10 mg/dL (ref 6–23)
CO2: 30 mEq/L (ref 19–32)
Calcium: 9.4 mg/dL (ref 8.4–10.5)
Chloride: 102 mEq/L (ref 96–112)
Creatinine, Ser: 0.99 mg/dL (ref 0.40–1.50)
GFR: 74.69 mL/min (ref >60.00)
Glucose, Bld: 81 mg/dL (ref 70–99)
Potassium: 3.6 mEq/L (ref 3.5–5.1)
Sodium: 140 mEq/L (ref 135–145)
Total Bilirubin: 0.9 mg/dL (ref 0.2–1.2)
Total Protein: 6.8 g/dL (ref 6.0–8.3)

## 2022-03-12 LAB — HEMOGLOBIN A1C: Hgb A1c MFr Bld: 8 % — ABNORMAL HIGH (ref 4.6–6.5)

## 2022-03-12 NOTE — Assessment & Plan Note (Signed)
Overall improved.  Discontinued gabapentin.

## 2022-03-12 NOTE — Assessment & Plan Note (Signed)
Continue simvastatin 20 mg daily. Repeat lipid panel pending. 

## 2022-03-12 NOTE — Assessment & Plan Note (Signed)
Controlled, none seen on exam.  Continue to monitor.

## 2022-03-14 ENCOUNTER — Other Ambulatory Visit: Payer: Self-pay | Admitting: Primary Care

## 2022-03-14 ENCOUNTER — Telehealth: Payer: Self-pay

## 2022-03-14 DIAGNOSIS — E1165 Type 2 diabetes mellitus with hyperglycemia: Secondary | ICD-10-CM

## 2022-03-14 MED ORDER — TIRZEPATIDE 7.5 MG/0.5ML ~~LOC~~ SOAJ: 7.5000 mg | SUBCUTANEOUS | 0 refills | Status: DC

## 2022-03-14 NOTE — Telephone Encounter (Signed)
See lab note for documentation.

## 2022-03-14 NOTE — Telephone Encounter (Signed)
Patient returned phone call about lab results.  

## 2022-04-03 DIAGNOSIS — H40051 Ocular hypertension, right eye: Secondary | ICD-10-CM | POA: Diagnosis not present

## 2022-04-03 DIAGNOSIS — Z961 Presence of intraocular lens: Secondary | ICD-10-CM | POA: Diagnosis not present

## 2022-04-09 DIAGNOSIS — H40051 Ocular hypertension, right eye: Secondary | ICD-10-CM | POA: Diagnosis not present

## 2022-04-09 DIAGNOSIS — E113313 Type 2 diabetes mellitus with moderate nonproliferative diabetic retinopathy with macular edema, bilateral: Secondary | ICD-10-CM | POA: Diagnosis not present

## 2022-04-09 DIAGNOSIS — H43813 Vitreous degeneration, bilateral: Secondary | ICD-10-CM | POA: Diagnosis not present

## 2022-04-09 DIAGNOSIS — H35371 Puckering of macula, right eye: Secondary | ICD-10-CM | POA: Diagnosis not present

## 2022-06-11 ENCOUNTER — Other Ambulatory Visit: Payer: Self-pay | Admitting: Primary Care

## 2022-06-11 DIAGNOSIS — E119 Type 2 diabetes mellitus without complications: Secondary | ICD-10-CM

## 2022-06-11 DIAGNOSIS — E785 Hyperlipidemia, unspecified: Secondary | ICD-10-CM

## 2022-06-14 ENCOUNTER — Ambulatory Visit (INDEPENDENT_AMBULATORY_CARE_PROVIDER_SITE_OTHER): Payer: HMO | Admitting: Primary Care

## 2022-06-14 ENCOUNTER — Encounter: Payer: Self-pay | Admitting: Primary Care

## 2022-06-14 VITALS — BP 138/76 | HR 67 | Temp 97.6°F | Ht 72.0 in | Wt 203.0 lb

## 2022-06-14 DIAGNOSIS — E1165 Type 2 diabetes mellitus with hyperglycemia: Secondary | ICD-10-CM | POA: Diagnosis not present

## 2022-06-14 DIAGNOSIS — Z23 Encounter for immunization: Secondary | ICD-10-CM

## 2022-06-14 DIAGNOSIS — Z1211 Encounter for screening for malignant neoplasm of colon: Secondary | ICD-10-CM

## 2022-06-14 DIAGNOSIS — Z794 Long term (current) use of insulin: Secondary | ICD-10-CM

## 2022-06-14 LAB — POCT GLYCOSYLATED HEMOGLOBIN (HGB A1C): Hemoglobin A1C: 7.5 % — AB (ref 4.0–5.6)

## 2022-06-14 LAB — MICROALBUMIN / CREATININE URINE RATIO
Creatinine,U: 78.8 mg/dL
Microalb Creat Ratio: 17.9 mg/g (ref 0.0–30.0)
Microalb, Ur: 14.1 mg/dL — ABNORMAL HIGH (ref 0.0–1.9)

## 2022-06-14 NOTE — Patient Instructions (Addendum)
Keep up the great work!  You will be contacted regarding your referral to GI for the colonoscopy.  Please let us know if you have not been contacted within two weeks.   Please schedule a follow up visit for 6 months for a diabetes check.  It was a pleasure to see you today!

## 2022-06-14 NOTE — Assessment & Plan Note (Signed)
Improving with A1C of 7.5!  Commended him on smaller portion sizes, Mounjaro is helping.  Continue metformin 1000 mg BID, glipizide 10 mg BID, Mounjaro 7.5 mg weekly, Lantus 30 units in AM and 25 units in PM.  Foot exam today. Urine microalbumin due and pending. Pneumonia vaccine UTD. Managed on statin.  Follow up in 6 months.

## 2022-06-14 NOTE — Progress Notes (Signed)
Subjective:    Patient ID: Lawrence Parker, male    DOB: 20-Sep-1945, 76 y.o.   MRN: [id number]  Diabetes Pertinent negatives for hypoglycemia include no dizziness. Pertinent negatives for diabetes include no chest pain.    Lawrence Parker is a very pleasant 76 y.o. male with history of hypertension, PAD, type 2 diabetes, hyperlipidemia, decreased renal function who presents today for follow-up of diabetes.  Current medications include: Glipizide 10 mg twice daily, metformin 1000 mg twice daily, Mounjaro 7.5 mg weekly, Lantus insulin 30 units in the morning and 25 units in the evening.   He is checking his blood glucose 1 times daily and is getting readings of:  AM fasting: low 100's  Last A1C: 8.0 in June 2023, 7.5 today  Last Eye Exam: Due Last Foot Exam: Due Pneumonia Vaccination: 2019 Urine Microalbumin: Due Statin: Simvastatin  Dietary changes since last visit: Reduction in appetite, smaller portion sizes.    Exercise: Bicycling a few times weekly   Wt Readings from Last 3 Encounters:  06/14/22 203 lb (92.1 kg)  03/12/22 207 lb (93.9 kg)  12/07/21 214 lb 3.2 oz (97.2 kg)       Review of Systems  Respiratory:  Negative for shortness of breath.   Cardiovascular:  Negative for chest pain.  Neurological:  Negative for dizziness and numbness.         Past Medical History:  Diagnosis Date   Anxiety    takes Xanax prn   Arthritis    neck   Back pain    deteriorating disc   Bladder neck obstruction    Colon polyps    Depression    hx of but doesn't take any medications   Diabetes mellitus    takes Actos,Glipizide,Metformin,and Lantus   Diverticula of colon    History of kidney stones    also has one at present time    Hyperlipidemia    takes Zocor daily   Hypertension    takes Amlodipine daily and HCTZ daily   Neck pain    cervical spondylosis   Peyronie disease     Social History   Socioeconomic History   Marital status: Married    Spouse  name: Not on file   Number of children: Not on file   Years of education: Not on file   Highest education level: Not on file  Occupational History   Occupation: retired  Tobacco Use   Smoking status: Former    Packs/day: 1.00    Types: Cigarettes   Smokeless tobacco: Never   Tobacco comments:    quit smoking 42yr ago  Vaping Use   Vaping Use: Never used  Substance and Sexual Activity   Alcohol use: No   Drug use: No   Sexual activity: Not Currently  Other Topics Concern   Not on file  Social History Narrative   Married.   2 children. 2 grandchildren.   Retired. Drove trucks for 43 years.   Enjoys traveling, spending time with family.   Social Determinants of Health   Financial Resource Strain: Low Risk  (07/10/2021)   Overall Financial Resource Strain (CARDIA)    Difficulty of Paying Living Expenses: Not hard at all  Food Insecurity: No Food Insecurity (07/10/2021)   Hunger Vital Sign    Worried About Running Out of Food in the Last Year: Never true    Ran Out of Food in the Last Year: Never true  Transportation Needs: No Transportation Needs (07/10/2021)  PRAPARE - Hydrologist (Medical): No    Lack of Transportation (Non-Medical): No  Physical Activity: Sufficiently Active (07/10/2021)   Exercise Vital Sign    Days of Exercise per Week: 3 days    Minutes of Exercise per Session: 50 min  Stress: No Stress Concern Present (07/10/2021)   [place]    Feeling of Stress : Not at all  Social Connections: Moderately Isolated (07/10/2021)   Social Connection and Isolation Panel [NHANES]    Frequency of Communication with Friends and Family: More than three times a week    Frequency of Social Gatherings with Friends and Family: More than three times a week    Attends Religious Services: Never    Marine scientist or Organizations: No    Attends Archivist  Meetings: Never    Marital Status: Married  Human resources officer Violence: Not At Risk (07/10/2021)   Humiliation, Afraid, Rape, and Kick questionnaire    Fear of Current or Ex-Partner: No    Emotionally Abused: No    Physically Abused: No    Sexually Abused: No    Past Surgical History:  Procedure Laterality Date   CERVICAL SPINE SURGERY     CIRCUMCISION  20+yrs ago   COLONOSCOPY     NOSE SURGERY  20+yrs ago   SHOULDER SURGERY     right 20+yrs ago    Family History  Problem Relation Age of Onset   Cancer Mother    Diabetes Mother    Cancer Father    Cancer Sister    Diabetes Sister    Cancer Sister    Diabetes Brother    Anesthesia problems Neg Hx    Hypotension Neg Hx    Malignant hyperthermia Neg Hx    Pseudochol deficiency Neg Hx     No Known Allergies  Current Outpatient Medications on File Prior to Visit  Medication Sig Dispense Refill   Alcohol Swabs (DROPSAFE ALCOHOL PREP) 70 % PADS USE AS DIRECTED AS NEEDED 400 each 0   amLODipine (NORVASC) 10 MG tablet TAKE 1 TABLET BY MOUTH EVERY DAY FOR BLOOD PRESSURE 90 tablet 2   aspirin 325 MG tablet Take 325 mg by mouth daily.     Blood Glucose Calibration (ACCU-CHEK AVIVA) SOLN Use as instructed. 1 each 1   Blood Glucose Monitoring Suppl (ACCU-CHEK AVIVA PLUS) w/Device KIT Check blood sugar twice a day and as directed. Dx E11.9 1 kit 0   fluticasone (FLONASE) 50 MCG/ACT nasal Parker USE 2 SPRAYS IN EACH NOSTRIL EVERY DAY 48 g 1   glipiZIDE (GLUCOTROL) 10 MG tablet TAKE 1 TABLET BY MOUTH TWICE DAILY FOR DIABETES 180 tablet 0   glucose blood (ACCU-CHEK AVIVA PLUS) test strip TEST BLOOD SUGAR TWICE DAILY AS DIRECTED 200 strip 5   hydrochlorothiazide (HYDRODIURIL) 25 MG tablet TAKE 1 TABLET BY MOUTH EVERY MORNING FOR BLOOD PRESSURE 90 tablet 2   insulin glargine (LANTUS SOLOSTAR) 100 UNIT/ML Solostar Pen Inject 30 units in the morning and 25 units in the evening for diabetes. 60 mL 0   Insulin Pen Needle 31G X 8 MM MISC Use as  directed to injection insulin. 200 each 3   Lancets (ACCU-CHEK SOFT TOUCH) lancets Check blood sugar twice a day and as directed. Dx E11.9 300 each 2   lisinopril (ZESTRIL) 10 MG tablet TAKE 1 TABLET EVERY DAY 90 tablet 1   metFORMIN (GLUCOPHAGE) 500 MG tablet Take 2  tablets (1,000 mg total) by mouth 2 (two) times daily with a meal. For diabetes. 360 tablet 0   simvastatin (ZOCOR) 20 MG tablet TAKE 1 TABLET BY MOUTH EVERY DAY FOR CHOLESTEROL 90 tablet 1   tirzepatide (MOUNJARO) 7.5 MG/0.5ML Pen Inject 7.5 mg into the skin once a week. for diabetes. 6 mL 0   No current facility-administered medications on file prior to visit.    BP 138/76   Pulse 67   Temp 97.6 F (36.4 C) (Temporal)   Ht 6' (1.829 m)   Wt 203 lb (92.1 kg)   SpO2 97%   BMI 27.53 kg/m  Objective:   Physical Exam Cardiovascular:     Rate and Rhythm: Normal rate and regular rhythm.  Pulmonary:     Effort: Pulmonary effort is normal.     Breath sounds: Normal breath sounds. No wheezing or rales.  Musculoskeletal:     Cervical back: Neck supple.  Skin:    General: Skin is warm and dry.  Neurological:     Mental Status: He is alert and oriented to person, place, and time.           Assessment & Plan:   Problem List Items Addressed This Visit       Endocrine   Type 2 diabetes mellitus with hyperglycemia ([place]) - Primary    Improving with A1C of 7.5!  Commended him on smaller portion sizes, Mounjaro is helping.  Continue metformin 1000 mg BID, glipizide 10 mg BID, Mounjaro 7.5 mg weekly, Lantus 30 units in AM and 25 units in PM.  Foot exam today. Urine microalbumin due and pending. Pneumonia vaccine UTD. Managed on statin.  Follow up in 6 months.      Relevant Orders   POCT glycosylated hemoglobin (Hb A1C) (Completed)   Microalbumin / creatinine urine ratio   Other Visit Diagnoses     Screening for colon cancer       Relevant Orders   Ambulatory referral to Gastroenterology           Pleas Koch, NP

## 2022-06-21 ENCOUNTER — Other Ambulatory Visit: Payer: Self-pay | Admitting: Primary Care

## 2022-06-21 DIAGNOSIS — E1165 Type 2 diabetes mellitus with hyperglycemia: Secondary | ICD-10-CM

## 2022-07-16 ENCOUNTER — Ambulatory Visit (INDEPENDENT_AMBULATORY_CARE_PROVIDER_SITE_OTHER): Payer: HMO

## 2022-07-16 VITALS — Wt 203.0 lb

## 2022-07-16 DIAGNOSIS — Z0000 Encounter for general adult medical examination without abnormal findings: Secondary | ICD-10-CM

## 2022-07-16 NOTE — Progress Notes (Signed)
Virtual Visit via Telephone Note  I connected with  [name] on 07/16/22 at 10:45 AM EDT by telephone and verified that I am speaking with the correct person using two identifiers.  Location: Patient: home Provider: [place] Persons participating in the virtual visit: [place]   I discussed the limitations, risks, security and privacy concerns of performing an evaluation and management service by telephone and the availability of in person appointments. The patient expressed understanding and agreed to proceed.  Interactive audio and video telecommunications were attempted between this nurse and patient, however failed, due to patient having technical difficulties OR patient did not have access to video capability.  We continued and completed visit with audio only.  Some vital signs may be absent or patient reported.   Dionisio David, LPN  Subjective:   MACALISTER ARNAUD is a 76 y.o. male who presents for Medicare Annual/Subsequent preventive examination.  Review of Systems     Cardiac Risk Factors include: advanced age (>5mn, >>19women);hypertension;male gender;dyslipidemia     Objective:    There were no vitals filed for this visit. There is no height or weight on file to calculate BMI.     07/16/2022   10:55 AM 07/10/2021   12:09 PM 02/15/2020    9:01 AM 04/23/2019    9:20 AM 04/20/2018   12:32 PM 11/15/2011    2:00 PM 11/08/2011   11:00 AM  Advanced Directives  Does Patient Have a Medical Advance Directive? No No No No Yes Patient does not have advance directive Patient does not have advance directive;Patient would like information  Type of Advance Directive     [place]    Copy of [place]?     No - copy requested    Would patient like information on creating a medical advance directive? No - Patient declined Yes (MAU/Ambulatory/Procedural Areas - Information given) No - Patient  declined    Advance directive packet given  Pre-existing out of facility DNR order (yellow form or pink MOST form)      No No    Current Medications (verified) Outpatient Encounter Medications as of 07/16/2022  Medication Sig   Alcohol Swabs (DROPSAFE ALCOHOL PREP) 70 % PADS USE AS DIRECTED AS NEEDED   amLODipine (NORVASC) 10 MG tablet TAKE 1 TABLET BY MOUTH EVERY DAY FOR BLOOD PRESSURE   aspirin 325 MG tablet Take 325 mg by mouth daily.   Blood Glucose Calibration (ACCU-CHEK AVIVA) SOLN Use as instructed.   Blood Glucose Monitoring Suppl (ACCU-CHEK AVIVA PLUS) w/Device KIT Check blood sugar twice a day and as directed. Dx E11.9   fluticasone (FLONASE) 50 MCG/ACT nasal spray USE 2 SPRAYS IN EACH NOSTRIL EVERY DAY   glipiZIDE (GLUCOTROL) 10 MG tablet TAKE 1 TABLET BY MOUTH TWICE DAILY FOR DIABETES   glucose blood (ACCU-CHEK AVIVA PLUS) test strip TEST BLOOD SUGAR TWICE DAILY AS DIRECTED   hydrochlorothiazide (HYDRODIURIL) 25 MG tablet TAKE 1 TABLET BY MOUTH EVERY MORNING FOR BLOOD PRESSURE   insulin glargine (LANTUS SOLOSTAR) 100 UNIT/ML Solostar Pen Inject 30 units in the morning and 25 units in the evening for diabetes.   Insulin Pen Needle 31G X 8 MM MISC Use as directed to injection insulin.   Lancets (ACCU-CHEK SOFT TOUCH) lancets Check blood sugar twice a day and as directed. Dx E11.9   lisinopril (ZESTRIL) 10 MG tablet TAKE 1 TABLET EVERY DAY   metFORMIN (GLUCOPHAGE) 500 MG tablet  Take 2 tablets (1,000 mg total) by mouth 2 (two) times daily with a meal. For diabetes.   simvastatin (ZOCOR) 20 MG tablet TAKE 1 TABLET BY MOUTH EVERY DAY FOR CHOLESTEROL   tirzepatide (MOUNJARO) 7.5 MG/0.5ML Pen INJECT 7.5 MG INTO THE SKIN ONCE A WEEK. FOR DIABETES.   No facility-administered encounter medications on file as of 07/16/2022.    Allergies (verified) Patient has no known allergies.   History: Past Medical History:  Diagnosis Date   Anxiety    takes Xanax prn   Arthritis    neck    Back pain    deteriorating disc   Bladder neck obstruction    Colon polyps    Depression    hx of but doesn't take any medications   Diabetes mellitus    takes Actos,Glipizide,Metformin,and Lantus   Diverticula of colon    History of kidney stones    also has one at present time    Hyperlipidemia    takes Zocor daily   Hypertension    takes Amlodipine daily and HCTZ daily   Neck pain    cervical spondylosis   Peyronie disease    Past Surgical History:  Procedure Laterality Date   CERVICAL SPINE SURGERY     CIRCUMCISION  20+yrs ago   COLONOSCOPY     NOSE SURGERY  20+yrs ago   SHOULDER SURGERY     right 20+yrs ago   Family History  Problem Relation Age of Onset   Cancer Mother    Diabetes Mother    Cancer Father    Cancer Sister    Diabetes Sister    Cancer Sister    Diabetes Brother    Anesthesia problems Neg Hx    Hypotension Neg Hx    Malignant hyperthermia Neg Hx    Pseudochol deficiency Neg Hx    Social History   Socioeconomic History   Marital status: Married    Spouse name: Not on file   Number of children: Not on file   Years of education: Not on file   Highest education level: Not on file  Occupational History   Occupation: retired  Tobacco Use   Smoking status: Former    Packs/day: 1.00    Types: Cigarettes   Smokeless tobacco: Never   Tobacco comments:    quit smoking 105yr ago  Vaping Use   Vaping Use: Never used  Substance and Sexual Activity   Alcohol use: No   Drug use: No   Sexual activity: Not Currently  Other Topics Concern   Not on file  Social History Narrative   Married.   2 children. 2 grandchildren.   Retired. Drove trucks for 43 years.   Enjoys traveling, spending time with family.   Social Determinants of Health   Financial Resource Strain: Low Risk  (07/16/2022)   Overall Financial Resource Strain (CARDIA)    Difficulty of Paying Living Expenses: Not hard at all  Food Insecurity: No Food Insecurity (07/16/2022)    Hunger Vital Sign    Worried About Running Out of Food in the Last Year: Never true    Ran Out of Food in the Last Year: Never true  Transportation Needs: No Transportation Needs (07/16/2022)   PRAPARE - THydrologist(Medical): No    Lack of Transportation (Non-Medical): No  Physical Activity: Sufficiently Active (07/16/2022)   Exercise Vital Sign    Days of Exercise per Week: 3 days    Minutes of Exercise per  Session: 60 min  Stress: No Stress Concern Present (07/16/2022)   [place]    Feeling of Stress : Not at all  Social Connections: Moderately Isolated (07/16/2022)   Social Connection and Isolation Panel [NHANES]    Frequency of Communication with Friends and Family: More than three times a week    Frequency of Social Gatherings with Friends and Family: Once a week    Attends Religious Services: Never    Marine scientist or Organizations: No    Attends Music therapist: Never    Marital Status: Married    Tobacco Counseling Counseling given: Not Answered Tobacco comments: quit smoking 36yr ago   Clinical Intake:  Pre-visit preparation completed: Yes  Pain : No/denies pain     Nutritional Risks: None Diabetes: Yes CBG done?: No Did pt. bring in CBG monitor from home?: No  How often do you need to have someone help you when you read instructions, pamphlets, or other written materials from your doctor or pharmacy?: 1 - Never  Diabetic?no  Interpreter Needed?: No  Information entered by :: LKirke Shaggy LPN   Activities of Daily Living    07/16/2022   10:56 AM  In your present state of health, do you have any difficulty performing the following activities:  Hearing? 0  Vision? 0  Difficulty concentrating or making decisions? 0  Walking or climbing stairs? 0  Dressing or bathing? 0  Doing errands, shopping? 0  Preparing Food and eating ? N   Using the Toilet? N  In the past six months, have you accidently leaked urine? N  Do you have problems with loss of bowel control? N  Managing your Medications? N  Managing your Finances? N  Housekeeping or managing your Housekeeping? N    Patient Care Team: CPleas Koch NP as PCP - General (Internal Medicine)  Indicate any recent Medical Services you may have received from other than [facility] providers in the past year (date may be approximate).     Assessment:   This is a routine wellness examination for [place].  Hearing/Vision screen Hearing Screening - Comments:: No aids Vision Screening - Comments:: No glasses  Dietary issues and exercise activities discussed: Current Exercise Habits: Home exercise routine, Type of exercise: walking, Time (Minutes): 60, Frequency (Times/Week): 3, Weekly Exercise (Minutes/Week): 180, Intensity: Mild   Goals Addressed             This Visit's Progress    DIET - EAT MORE FRUITS AND VEGETABLES         Depression Screen    07/16/2022   10:54 AM 03/12/2022    9:53 AM 12/07/2021    9:28 AM 07/10/2021   12:18 PM 02/22/2021    9:02 AM 02/15/2020    9:00 AM 04/23/2019    9:21 AM  PHQ 2/9 Scores  PHQ - 2 Score 0 0 0 0 1 0 0  PHQ- 9 Score 0 0   7  0    Fall Risk    07/16/2022   10:55 AM 12/07/2021    9:28 AM 07/10/2021   12:17 PM 02/22/2021    9:02 AM 02/15/2020    9:00 AM  Fall Risk   Falls in the past year? 0 0 0 0 1  Number falls in past yr: 0  0 0 0  Injury with Fall? 0  0 0 1  Comment     pt reports he hurt his  left knee with fall - no surgery  Risk for fall due to : No Fall Risks  No Fall Risks  History of fall(s);Impaired balance/gait  Follow up Falls prevention discussed;Falls evaluation completed Falls evaluation completed Falls prevention discussed  Falls prevention discussed    FALL RISK PREVENTION PERTAINING TO THE HOME:  Any stairs in or around the home? No  If so, are there any without handrails? No  Home free of  loose throw rugs in walkways, pet beds, electrical cords, etc? Yes  Adequate lighting in your home to reduce risk of falls? Yes   ASSISTIVE DEVICES UTILIZED TO PREVENT FALLS:  Life alert? No  Use of a cane, walker or w/c? No  Grab bars in the bathroom? No  Shower chair or bench in shower? No  Elevated toilet seat or a handicapped toilet? No    Cognitive Function:    04/23/2019    9:26 AM 04/20/2018    3:47 PM  MMSE - Mini Mental State Exam  Orientation to time 5 5  Orientation to Place 5 5  Registration 3 3  Attention/ Calculation 0 0  Attention/Calculation-comments could not spell word   Recall 3 3  Language- name 2 objects 0 0  Language- repeat 1 1  Language- follow 3 step command 0 3  Language- read & follow direction 0 0  Write a sentence 0 0  Copy design 0 0  Total score 17 20        07/16/2022   10:56 AM  6CIT Screen  What Year? 0 points  What month? 0 points  What time? 0 points  Count back from 20 0 points  Months in reverse 0 points  Repeat phrase 0 points  Total Score 0 points    Immunizations Immunization History  Administered Date(s) Administered   Fluad Quad(high Dose 65+) 08/02/2019, 11/17/2020, 08/24/2021, 06/14/2022   Influenza,inj,Quad PF,6+ Mos 07/15/2016, 10/16/2017, 08/06/2018   PFIZER(Purple Top)SARS-COV-2 Vaccination 10/18/2019, 11/15/2019   Pneumococcal Conjugate-13 07/15/2016   Pneumococcal Polysaccharide-23 11/16/2011, 04/20/2018   Zoster Recombinat (Shingrix) 02/22/2021, 12/28/2021    TDAP status: Due, Education has been provided regarding the importance of this vaccine. Advised may receive this vaccine at local pharmacy or Health Dept. Aware to provide a copy of the vaccination record if obtained from local pharmacy or Health Dept. Verbalized acceptance and understanding.  Flu Vaccine status: Up to date  Pneumococcal vaccine status: Up to date  Covid-19 vaccine status: Completed vaccines  Qualifies for Shingles Vaccine? Yes    Zostavax completed No   Shingrix Completed?: Yes  Screening Tests Health Maintenance  Topic Date Due   TETANUS/TDAP  Never done   OPHTHALMOLOGY EXAM  01/15/2019   COLONOSCOPY (Pts 45-39yr Insurance coverage will need to be confirmed)  09/16/2021   HEMOGLOBIN A1C  12/13/2022   Diabetic kidney evaluation - GFR measurement  03/13/2023   Diabetic kidney evaluation - Urine ACR  06/15/2023   FOOT EXAM  06/15/2023   Medicare Annual Wellness (AWV)  07/17/2023   Pneumonia Vaccine 75 Years old  Completed   INFLUENZA VACCINE  Completed   Hepatitis C Screening  Completed   Zoster Vaccines- Shingrix  Completed   HPV VACCINES  Aged Out   COVID-19 Vaccine  Discontinued    Health Maintenance  Health Maintenance Due  Topic Date Due   TETANUS/TDAP  Never done   OPHTHALMOLOGY EXAM  01/15/2019   COLONOSCOPY (Pts 45-468yrInsurance coverage will need to be confirmed)  09/16/2021    Colorectal cancer  screening: Type of screening: Colonoscopy. Completed 06/02/15. Repeat every 10 years  Lung Cancer Screening: (Low Dose CT Chest recommended if Age 60-80 years, 30 pack-year currently smoking OR have quit w/in 15years.) does not qualify.   Additional Screening:  Hepatitis C Screening: does qualify; Completed 05/16/17  Vision Screening: Recommended annual ophthalmology exams for early detection of glaucoma and other disorders of the eye. Is the patient up to date with their annual eye exam?  No  Who is the provider or what is the name of the office in which the patient attends annual eye exams? No one If pt is not established with a provider, would they like to be referred to a provider to establish care? No .   Dental Screening: Recommended annual dental exams for proper oral hygiene  Community Resource Referral / Chronic Care Management: CRR required this visit?  No   CCM required this visit?  No      Plan:     I have personally reviewed and noted the following in the patient's chart:    Medical and social history Use of alcohol, tobacco or illicit drugs  Current medications and supplements including opioid prescriptions. Patient is not currently taking opioid prescriptions. Functional ability and status Nutritional status Physical activity Advanced directives List of other physicians Hospitalizations, surgeries, and ER visits in previous 12 months Vitals Screenings to include cognitive, depression, and falls Referrals and appointments  In addition, I have reviewed and discussed with patient certain preventive protocols, quality metrics, and best practice recommendations. A written personalized care plan for preventive services as well as general preventive health recommendations were provided to patient.     Dionisio David, LPN   [id number]   Nurse Notes: none

## 2022-07-16 NOTE — Patient Instructions (Signed)
[name] , Thank you for taking time to come for your Medicare Wellness Visit. I appreciate your ongoing commitment to your health goals. Please review the following plan we discussed and let me know if I can assist you in the future.   Screening recommendations/referrals: Colonoscopy: 06/02/15 Recommended yearly ophthalmology/optometry visit for glaucoma screening and checkup Recommended yearly dental visit for hygiene and checkup  Vaccinations: Influenza vaccine: 06/14/22 Pneumococcal vaccine: 04/20/18 Tdap vaccine: n/d Shingles vaccine: Shingrix 02/22/21, 12/28/21   Covid-19: 10/18/19, 12/05/19  Advanced directives: no  Conditions/risks identified: none  Next appointment: Follow up in one year for your annual wellness visit.   Preventive Care 75 Years and Older, Male Preventive care refers to lifestyle choices and visits with your health care provider that can promote health and wellness. What does preventive care include? A yearly physical exam. This is also called an annual well check. Dental exams once or twice a year. Routine eye exams. Ask your health care provider how often you should have your eyes checked. Personal lifestyle choices, including: Daily care of your teeth and gums. Regular physical activity. Eating a healthy diet. Avoiding tobacco and drug use. Limiting alcohol use. Practicing safe sex. Taking low doses of aspirin every day. Taking vitamin and mineral supplements as recommended by your health care provider. What happens during an annual well check? The services and screenings done by your health care provider during your annual well check will depend on your age, overall health, lifestyle risk factors, and family history of disease. Counseling  Your health care provider may ask you questions about your: Alcohol use. Tobacco use. Drug use. Emotional well-being. Home and relationship well-being. Sexual activity. Eating habits. History of falls. Memory  and ability to understand (cognition). Work and work Statistician. Screening  You may have the following tests or measurements: Height, weight, and BMI. Blood pressure. Lipid and cholesterol levels. These may be checked every 5 years, or more frequently if you are over 73 years old. Skin check. Lung cancer screening. You may have this screening every year starting at age 76 if you have a 30-pack-year history of smoking and currently smoke or have quit within the past 15 years. Fecal occult blood test (FOBT) of the stool. You may have this test every year starting at age 25. Flexible sigmoidoscopy or colonoscopy. You may have a sigmoidoscopy every 5 years or a colonoscopy every 10 years starting at age 75. Prostate cancer screening. Recommendations will vary depending on your family history and other risks. Hepatitis C blood test. Hepatitis B blood test. Sexually transmitted disease (STD) testing. Diabetes screening. This is done by checking your blood sugar (glucose) after you have not eaten for a while (fasting). You may have this done every 1-3 years. Abdominal aortic aneurysm (AAA) screening. You may need this if you are a current or former smoker. Osteoporosis. You may be screened starting at age 17 if you are at high risk. Talk with your health care provider about your test results, treatment options, and if necessary, the need for more tests. Vaccines  Your health care provider may recommend certain vaccines, such as: Influenza vaccine. This is recommended every year. Tetanus, diphtheria, and acellular pertussis (Tdap, Td) vaccine. You may need a Td booster every 10 years. Zoster vaccine. You may need this after age 42. Pneumococcal 13-valent conjugate (PCV13) vaccine. One dose is recommended after age 44. Pneumococcal polysaccharide (PPSV23) vaccine. One dose is recommended after age 29. Talk to your health care provider about which screenings  and vaccines you need and how often you  need them. This information is not intended to replace advice given to you by your health care provider. Make sure you discuss any questions you have with your health care provider. Document Released: 09/29/2015 Document Revised: 05/22/2016 Document Reviewed: 07/04/2015 Elsevier Interactive Patient Education  2017 [place] Prevention in the Home Falls can cause injuries. They can happen to people of all ages. There are many things you can do to make your home safe and to help prevent falls. What can I do on the outside of my home? Regularly fix the edges of walkways and driveways and fix any cracks. Remove anything that might make you trip as you walk through a door, such as a raised step or threshold. Trim any bushes or trees on the path to your home. Use bright outdoor lighting. Clear any walking paths of anything that might make someone trip, such as rocks or tools. Regularly check to see if handrails are loose or broken. Make sure that both sides of any steps have handrails. Any raised decks and porches should have guardrails on the edges. Have any leaves, snow, or ice cleared regularly. Use sand or salt on walking paths during winter. Clean up any spills in your garage right away. This includes oil or grease spills. What can I do in the bathroom? Use night lights. Install grab bars by the toilet and in the tub and shower. Do not use towel bars as grab bars. Use non-skid mats or decals in the tub or shower. If you need to sit down in the shower, use a plastic, non-slip stool. Keep the floor dry. Clean up any water that spills on the floor as soon as it happens. Remove soap buildup in the tub or shower regularly. Attach bath mats securely with double-sided non-slip rug tape. Do not have throw rugs and other things on the floor that can make you trip. What can I do in the bedroom? Use night lights. Make sure that you have a light by your bed that is easy to reach. Do not  use any sheets or blankets that are too big for your bed. They should not hang down onto the floor. Have a firm chair that has side arms. You can use this for support while you get dressed. Do not have throw rugs and other things on the floor that can make you trip. What can I do in the kitchen? Clean up any spills right away. Avoid walking on wet floors. Keep items that you use a lot in easy-to-reach places. If you need to reach something above you, use a strong step stool that has a grab bar. Keep electrical cords out of the way. Do not use floor polish or wax that makes floors slippery. If you must use wax, use non-skid floor wax. Do not have throw rugs and other things on the floor that can make you trip. What can I do with my stairs? Do not leave any items on the stairs. Make sure that there are handrails on both sides of the stairs and use them. Fix handrails that are broken or loose. Make sure that handrails are as long as the stairways. Check any carpeting to make sure that it is firmly attached to the stairs. Fix any carpet that is loose or worn. Avoid having throw rugs at the top or bottom of the stairs. If you do have throw rugs, attach them to the floor with carpet tape.  Make sure that you have a light switch at the top of the stairs and the bottom of the stairs. If you do not have them, ask someone to add them for you. What else can I do to help prevent falls? Wear shoes that: Do not have high heels. Have rubber bottoms. Are comfortable and fit you well. Are closed at the toe. Do not wear sandals. If you use a stepladder: Make sure that it is fully opened. Do not climb a closed stepladder. Make sure that both sides of the stepladder are locked into place. Ask someone to hold it for you, if possible. Clearly mark and make sure that you can see: Any grab bars or handrails. First and last steps. Where the edge of each step is. Use tools that help you move around (mobility  aids) if they are needed. These include: Canes. Walkers. Scooters. Crutches. Turn on the lights when you go into a dark area. Replace any light bulbs as soon as they burn out. Set up your furniture so you have a clear path. Avoid moving your furniture around. If any of your floors are uneven, fix them. If there are any pets around you, be aware of where they are. Review your medicines with your doctor. Some medicines can make you feel dizzy. This can increase your chance of falling. Ask your doctor what other things that you can do to help prevent falls. This information is not intended to replace advice given to you by your health care provider. Make sure you discuss any questions you have with your health care provider. Document Released: 06/29/2009 Document Revised: 02/08/2016 Document Reviewed: 10/07/2014 Elsevier Interactive Patient Education  2017 [employer].

## 2022-08-22 ENCOUNTER — Other Ambulatory Visit: Payer: Self-pay | Admitting: Primary Care

## 2022-08-22 DIAGNOSIS — Z794 Long term (current) use of insulin: Secondary | ICD-10-CM

## 2022-09-11 ENCOUNTER — Ambulatory Visit: Payer: HMO | Admitting: Primary Care

## 2022-09-11 ENCOUNTER — Ambulatory Visit (INDEPENDENT_AMBULATORY_CARE_PROVIDER_SITE_OTHER): Payer: HMO | Admitting: Primary Care

## 2022-09-11 ENCOUNTER — Encounter: Payer: Self-pay | Admitting: Primary Care

## 2022-09-11 VITALS — BP 138/60 | HR 73 | Temp 97.3°F | Ht 72.0 in | Wt 200.0 lb

## 2022-09-11 DIAGNOSIS — E1165 Type 2 diabetes mellitus with hyperglycemia: Secondary | ICD-10-CM

## 2022-09-11 DIAGNOSIS — Z794 Long term (current) use of insulin: Secondary | ICD-10-CM | POA: Diagnosis not present

## 2022-09-11 LAB — POCT GLYCOSYLATED HEMOGLOBIN (HGB A1C): Hemoglobin A1C: 7.5 % — AB (ref 4.0–5.6)

## 2022-09-11 NOTE — Assessment & Plan Note (Signed)
Controlled with A1C today of 7.5!  Recommended to add in physical exercise.  Continue Lantus 30 units in AM and 25 units in PM. Continue metformin 1000 mg BID. Continue Glipizide 10 mg BID. Continue Mounjaro 7.5 mg weekly.  Follow up in 6 month.

## 2022-09-11 NOTE — Patient Instructions (Signed)
Please schedule a physical to meet with me in 6 months.   It was a pleasure to see you today!   

## 2022-09-11 NOTE — Progress Notes (Signed)
Subjective:    Patient ID: Lawrence Parker, male    DOB: 1946/02/23, 76 y.o.   MRN: [id number]  Diabetes Pertinent negatives for hypoglycemia include no dizziness. Pertinent negatives for diabetes include no chest pain.    Lawrence Parker is a very pleasant 76 y.o. male with a history of hypertension, PAD, type 2 diabetes, hyperlipidemia, osteoarthritis who presents today for follow up of diabetes.  Current medications include: Lantus 30 units in AM and 25 units in PM, Glipizide 10 mg BID, metformin 1000 mg BID, Mounjaro 7.5 mg weekly.  He is checking his blood glucose 1 times daily and is getting readings of: low to mid 100's.  Highest number: high 100's Lowest number: 75  Last A1C: 7.5 in September 2023, 7.5 today  Last Eye Exam: Due Last Foot Exam: UTD Pneumonia Vaccination: 2019 Urine Microalbumin: UTD Statin: simvastatin   Dietary changes since last visit: Smaller portion sizes    Exercise: None  Wt Readings from Last 3 Encounters:  09/11/22 200 lb (90.7 kg)  07/16/22 203 lb (92.1 kg)  06/14/22 203 lb (92.1 kg)       Review of Systems  Respiratory:  Negative for shortness of breath.   Cardiovascular:  Negative for chest pain.  Neurological:  Negative for dizziness and numbness.         Past Medical History:  Diagnosis Date   Anxiety    takes Xanax prn   Arthritis    neck   Back pain    deteriorating disc   Bladder neck obstruction    Colon polyps    Depression    hx of but doesn't take any medications   Diabetes mellitus    takes Actos,Glipizide,Metformin,and Lantus   Diverticula of colon    History of kidney stones    also has one at present time    Hyperlipidemia    takes Zocor daily   Hypertension    takes Amlodipine daily and HCTZ daily   Neck pain    cervical spondylosis   Peyronie disease     Social History   Socioeconomic History   Marital status: Married    Spouse name: Not on file   Number of children: Not on file    Years of education: Not on file   Highest education level: Not on file  Occupational History   Occupation: retired  Tobacco Use   Smoking status: Former    Packs/day: 1.00    Types: Cigarettes   Smokeless tobacco: Never   Tobacco comments:    quit smoking 39yr ago  Vaping Use   Vaping Use: Never used  Substance and Sexual Activity   Alcohol use: No   Drug use: No   Sexual activity: Not Currently  Other Topics Concern   Not on file  Social History Narrative   Married.   2 children. 2 grandchildren.   Retired. Drove trucks for 43 years.   Enjoys traveling, spending time with family.   Social Determinants of Health   Financial Resource Strain: Low Risk  (07/16/2022)   Overall Financial Resource Strain (CARDIA)    Difficulty of Paying Living Expenses: Not hard at all  Food Insecurity: No Food Insecurity (07/16/2022)   Hunger Vital Sign    Worried About Running Out of Food in the Last Year: Never true    Ran Out of Food in the Last Year: Never true  Transportation Needs: No Transportation Needs (07/16/2022)   PRAPARE - Transportation    Lack of  Transportation (Medical): No    Lack of Transportation (Non-Medical): No  Physical Activity: Sufficiently Active (07/16/2022)   Exercise Vital Sign    Days of Exercise per Week: 3 days    Minutes of Exercise per Session: 60 min  Stress: No Stress Concern Present (07/16/2022)   [place]    Feeling of Stress : Not at all  Social Connections: Moderately Isolated (07/16/2022)   Social Connection and Isolation Panel [NHANES]    Frequency of Communication with Friends and Family: More than three times a week    Frequency of Social Gatherings with Friends and Family: Once a week    Attends Religious Services: Never    Marine scientist or Organizations: No    Attends Archivist Meetings: Never    Marital Status: Married  Human resources officer Violence: Not  At Risk (07/16/2022)   Humiliation, Afraid, Rape, and Kick questionnaire    Fear of Current or Ex-Partner: No    Emotionally Abused: No    Physically Abused: No    Sexually Abused: No    Past Surgical History:  Procedure Laterality Date   CERVICAL SPINE SURGERY     CIRCUMCISION  20+yrs ago   COLONOSCOPY     NOSE SURGERY  20+yrs ago   SHOULDER SURGERY     right 20+yrs ago    Family History  Problem Relation Age of Onset   Cancer Mother    Diabetes Mother    Cancer Father    Cancer Sister    Diabetes Sister    Cancer Sister    Diabetes Brother    Anesthesia problems Neg Hx    Hypotension Neg Hx    Malignant hyperthermia Neg Hx    Pseudochol deficiency Neg Hx     No Known Allergies  Current Outpatient Medications on File Prior to Visit  Medication Sig Dispense Refill   Alcohol Swabs (DROPSAFE ALCOHOL PREP) 70 % PADS USE AS DIRECTED AS NEEDED 400 each 0   amLODipine (NORVASC) 10 MG tablet TAKE 1 TABLET BY MOUTH EVERY DAY FOR BLOOD PRESSURE 90 tablet 2   aspirin 325 MG tablet Take 325 mg by mouth daily.     Blood Glucose Calibration (ACCU-CHEK AVIVA) SOLN Use as instructed. 1 each 1   Blood Glucose Monitoring Suppl (ACCU-CHEK AVIVA PLUS) w/Device KIT Check blood sugar twice a day and as directed. Dx E11.9 1 kit 0   fluticasone (FLONASE) 50 MCG/ACT nasal spray USE 2 SPRAYS IN EACH NOSTRIL EVERY DAY 48 g 1   glipiZIDE (GLUCOTROL) 10 MG tablet TAKE 1 TABLET BY MOUTH TWICE DAILY FOR DIABETES 180 tablet 0   glucose blood (ACCU-CHEK AVIVA PLUS) test strip TEST BLOOD SUGAR TWICE DAILY AS DIRECTED 200 strip 5   hydrochlorothiazide (HYDRODIURIL) 25 MG tablet TAKE 1 TABLET BY MOUTH EVERY MORNING FOR BLOOD PRESSURE 90 tablet 2   insulin glargine (LANTUS SOLOSTAR) 100 UNIT/ML Solostar Pen Inject 30 units in the morning and 25 units in the evening for diabetes. 60 mL 0   Insulin Pen Needle 31G X 8 MM MISC Use as directed to injection insulin. 200 each 3   Lancets (ACCU-CHEK SOFT TOUCH)  lancets Check blood sugar twice a day and as directed. Dx E11.9 300 each 2   lisinopril (ZESTRIL) 10 MG tablet TAKE 1 TABLET EVERY DAY 90 tablet 1   metFORMIN (GLUCOPHAGE) 500 MG tablet TAKE 2 TABLETS (1,000 MG TOTAL) BY MOUTH 2 (TWO) TIMES DAILY WITH  A MEAL. FOR DIABETES. 360 tablet 0   simvastatin (ZOCOR) 20 MG tablet TAKE 1 TABLET BY MOUTH EVERY DAY FOR CHOLESTEROL 90 tablet 1   tirzepatide (MOUNJARO) 7.5 MG/0.5ML Pen INJECT 7.5 MG INTO THE SKIN ONCE A WEEK. FOR DIABETES. 6 mL 1   No current facility-administered medications on file prior to visit.    BP 138/60 (BP Location: Left Arm, Patient Position: Sitting, Cuff Size: Normal)   Pulse 73   Temp (!) 97.3 F (36.3 C) (Temporal)   Ht 6' (1.829 m)   Wt 200 lb (90.7 kg)   SpO2 98%   BMI 27.12 kg/m  Objective:   Physical Exam Cardiovascular:     Rate and Rhythm: Normal rate and regular rhythm.  Pulmonary:     Effort: Pulmonary effort is normal.     Breath sounds: Normal breath sounds. No wheezing or rales.  Musculoskeletal:     Cervical back: Neck supple.  Skin:    General: Skin is warm and dry.  Neurological:     Mental Status: He is alert and oriented to person, place, and time.           Assessment & Plan:   Problem List Items Addressed This Visit       Endocrine   Type 2 diabetes mellitus with hyperglycemia ([place]) - Primary    Controlled with A1C today of 7.5!  Recommended to add in physical exercise.  Continue Lantus 30 units in AM and 25 units in PM. Continue metformin 1000 mg BID. Continue Glipizide 10 mg BID. Continue Mounjaro 7.5 mg weekly.  Follow up in 6 month.      Relevant Orders   POCT glycosylated hemoglobin (Hb A1C)       Pleas Koch, NP

## 2022-09-23 ENCOUNTER — Other Ambulatory Visit: Payer: Self-pay | Admitting: Primary Care

## 2022-09-23 DIAGNOSIS — E119 Type 2 diabetes mellitus without complications: Secondary | ICD-10-CM

## 2022-10-07 DIAGNOSIS — H35371 Puckering of macula, right eye: Secondary | ICD-10-CM | POA: Diagnosis not present

## 2022-10-07 DIAGNOSIS — E113313 Type 2 diabetes mellitus with moderate nonproliferative diabetic retinopathy with macular edema, bilateral: Secondary | ICD-10-CM | POA: Diagnosis not present

## 2022-10-07 DIAGNOSIS — H43813 Vitreous degeneration, bilateral: Secondary | ICD-10-CM | POA: Diagnosis not present

## 2022-10-11 ENCOUNTER — Telehealth: Payer: Self-pay | Admitting: Primary Care

## 2022-10-11 DIAGNOSIS — E118 Type 2 diabetes mellitus with unspecified complications: Secondary | ICD-10-CM

## 2022-10-11 NOTE — Telephone Encounter (Signed)
Prescription Request  10/11/2022  Is this a "Controlled Substance" medicine? No  LOV: 09/11/2022  What is the name of the medication or equipment? insulin glargine (LANTUS SOLOSTAR) 100 UNIT/ML Solostar Pen   Have you contacted your pharmacy to request a refill? No   Which pharmacy would you like this sent to?  [id number] [id number] [place] [id number]: [phone]: [phone]   Patient notified that their request is being sent to the clinical staff for review and that they should receive a response within 2 business days.   Please advise at Mobile [phone](mobile)

## 2022-10-14 MED ORDER — LANTUS SOLOSTAR 100 UNIT/ML ~~LOC~~ SOPN: PEN_INJECTOR | SUBCUTANEOUS | 1 refills | Status: DC

## 2022-10-14 NOTE — Addendum Note (Signed)
Addended by: Pleas Koch on: 10/14/2022 05:46 PM   Modules accepted: Orders

## 2022-10-14 NOTE — Telephone Encounter (Signed)
Refills sent to pharmacy. 

## 2022-10-16 DIAGNOSIS — H40051 Ocular hypertension, right eye: Secondary | ICD-10-CM | POA: Diagnosis not present

## 2022-10-16 DIAGNOSIS — Z961 Presence of intraocular lens: Secondary | ICD-10-CM | POA: Diagnosis not present

## 2022-11-01 ENCOUNTER — Other Ambulatory Visit: Payer: Self-pay | Admitting: Primary Care

## 2022-11-01 DIAGNOSIS — E1165 Type 2 diabetes mellitus with hyperglycemia: Secondary | ICD-10-CM

## 2022-12-13 ENCOUNTER — Ambulatory Visit: Payer: HMO | Admitting: Primary Care

## 2022-12-17 ENCOUNTER — Ambulatory Visit (INDEPENDENT_AMBULATORY_CARE_PROVIDER_SITE_OTHER): Payer: PPO | Admitting: Primary Care

## 2022-12-17 ENCOUNTER — Encounter: Payer: Self-pay | Admitting: Primary Care

## 2022-12-17 VITALS — BP 136/82 | HR 76 | Temp 98.2°F | Ht 72.0 in | Wt 200.0 lb

## 2022-12-17 DIAGNOSIS — I10 Essential (primary) hypertension: Secondary | ICD-10-CM | POA: Diagnosis not present

## 2022-12-17 DIAGNOSIS — Z794 Long term (current) use of insulin: Secondary | ICD-10-CM | POA: Diagnosis not present

## 2022-12-17 DIAGNOSIS — E1165 Type 2 diabetes mellitus with hyperglycemia: Secondary | ICD-10-CM | POA: Diagnosis not present

## 2022-12-17 DIAGNOSIS — E119 Type 2 diabetes mellitus without complications: Secondary | ICD-10-CM

## 2022-12-17 LAB — POCT GLYCOSYLATED HEMOGLOBIN (HGB A1C): Hemoglobin A1C: 7.4 % — AB (ref 4.0–5.6)

## 2022-12-17 MED ORDER — HYDROCHLOROTHIAZIDE 25 MG PO TABS: ORAL_TABLET | ORAL | 0 refills | Status: DC

## 2022-12-17 MED ORDER — GLIPIZIDE 10 MG PO TABS: 10.0000 mg | ORAL_TABLET | Freq: Two times a day (BID) | ORAL | 1 refills | Status: DC

## 2022-12-17 MED ORDER — METFORMIN HCL 500 MG PO TABS: 1000.0000 mg | ORAL_TABLET | Freq: Two times a day (BID) | ORAL | 1 refills | Status: DC

## 2022-12-17 NOTE — Assessment & Plan Note (Signed)
Improved with A1C of 7.4 today.  Continue Mounjaro 7.5 mg weekly, Lantus 25 units BID, Glipizide 10 mg BID, metformin 1000 mg BID.  Discussed to update eye exam.  Follow up in late June/early July.

## 2022-12-17 NOTE — Progress Notes (Signed)
Subjective:    Patient ID: Lawrence Parker, male    DOB: [birth year], 77 y.o.   MRN: [id number]  HPI  Lawrence Parker is a very pleasant 77 y.o. male with a history of hypertension, PAD, type 2 diabetes, hyperlipidemia who presents today for follow up of diabetes.    Current medications include: Glipizide 10 mg BID, metformin 1000 mg BID, Mounjaro 7.5 mg weekly, Lantus 25 units BID.   He is checking his blood glucose 1 times daily and is getting readings of:  AM fasting low 100's  Last A1C: 7.5 in September 2023, 7.4 today Last Eye Exam:Due Last Foot Exam: UTD Pneumonia Vaccination: 2019 Urine Microalbumin: UTD Statin: simvastatin   Dietary changes since last visit: Cutting back on portion sizes.    Exercise: No regular exercise.    BP Readings from Last 3 Encounters:  12/17/22 136/82  09/11/22 138/60  06/14/22 138/76          Review of Systems  Cardiovascular:  Negative for chest pain.  Gastrointestinal:  Negative for abdominal pain and constipation.  Neurological:  Negative for numbness.         Past Medical History:  Diagnosis Date   Anxiety    takes Xanax prn   Arthritis    neck   Back pain    deteriorating disc   Bladder neck obstruction    Colon polyps    Depression    hx of but doesn't take any medications   Diabetes mellitus    takes Actos,Glipizide,Metformin,and Lantus   Diverticula of colon    History of kidney stones    also has one at present time    Hyperlipidemia    takes Zocor daily   Hypertension    takes Amlodipine daily and HCTZ daily   Neck pain    cervical spondylosis   Peyronie disease     Social History   Socioeconomic History   Marital status: Married    Spouse name: Not on file   Number of children: Not on file   Years of education: Not on file   Highest education level: Not on file  Occupational History   Occupation: retired  Tobacco Use   Smoking status: Former    Packs/day: 1    Types: Cigarettes    Smokeless tobacco: Never   Tobacco comments:    quit smoking 66yrs ago  Scientific laboratory technician Use: Never used  Substance and Sexual Activity   Alcohol use: No   Drug use: No   Sexual activity: Not Currently  Other Topics Concern   Not on file  Social History Narrative   Married.   2 children. 2 grandchildren.   Retired. Drove trucks for 43 years.   Enjoys traveling, spending time with family.   Social Determinants of Health   Financial Resource Strain: Low Risk  (07/16/2022)   Overall Financial Resource Strain (CARDIA)    Difficulty of Paying Living Expenses: Not hard at all  Food Insecurity: No Food Insecurity (07/16/2022)   Hunger Vital Sign    Worried About Running Out of Food in the Last Year: Never true    Ran Out of Food in the Last Year: Never true  Transportation Needs: No Transportation Needs (07/16/2022)   PRAPARE - Hydrologist (Medical): No    Lack of Transportation (Non-Medical): No  Physical Activity: Sufficiently Active (07/16/2022)   Exercise Vital Sign    Days of Exercise per Week: 3 days  Minutes of Exercise per Session: 60 min  Stress: No Stress Concern Present (07/16/2022)   [place]    Feeling of Stress : Not at all  Social Connections: Moderately Isolated (07/16/2022)   Social Connection and Isolation Panel [NHANES]    Frequency of Communication with Friends and Family: More than three times a week    Frequency of Social Gatherings with Friends and Family: Once a week    Attends Religious Services: Never    Marine scientist or Organizations: No    Attends Archivist Meetings: Never    Marital Status: Married  Human resources officer Violence: Not At Risk (07/16/2022)   Humiliation, Afraid, Rape, and Kick questionnaire    Fear of Current or Ex-Partner: No    Emotionally Abused: No    Physically Abused: No    Sexually Abused: No    Past  Surgical History:  Procedure Laterality Date   CERVICAL SPINE SURGERY     CIRCUMCISION  20+yrs ago   COLONOSCOPY     NOSE SURGERY  20+yrs ago   SHOULDER SURGERY     right 20+yrs ago    Family History  Problem Relation Age of Onset   Cancer Mother    Diabetes Mother    Cancer Father    Cancer Sister    Diabetes Sister    Cancer Sister    Diabetes Brother    Anesthesia problems Neg Hx    Hypotension Neg Hx    Malignant hyperthermia Neg Hx    Pseudochol deficiency Neg Hx     No Known Allergies  Current Outpatient Medications on File Prior to Visit  Medication Sig Dispense Refill   Alcohol Swabs (DROPSAFE ALCOHOL PREP) 70 % PADS USE AS DIRECTED AS NEEDED 400 each 0   amLODipine (NORVASC) 10 MG tablet TAKE 1 TABLET BY MOUTH EVERY DAY FOR BLOOD PRESSURE 90 tablet 2   aspirin 325 MG tablet Take 325 mg by mouth daily.     Blood Glucose Calibration (ACCU-CHEK AVIVA) SOLN Use as instructed. 1 each 1   Blood Glucose Monitoring Suppl (ACCU-CHEK AVIVA PLUS) w/Device KIT Check blood sugar twice a day and as directed. Dx E11.9 1 kit 0   glucose blood (ACCU-CHEK AVIVA PLUS) test strip TEST BLOOD SUGAR TWICE DAILY AS DIRECTED 200 strip 5   insulin glargine (LANTUS SOLOSTAR) 100 UNIT/ML Solostar Pen Inject 30 units in the morning and 25 units in the evening for diabetes. 60 mL 1   Insulin Pen Needle 31G X 8 MM MISC Use as directed to injection insulin. 200 each 3   Lancets (ACCU-CHEK SOFT TOUCH) lancets Check blood sugar twice a day and as directed. Dx E11.9 300 each 2   lisinopril (ZESTRIL) 10 MG tablet TAKE 1 TABLET EVERY DAY 90 tablet 1   simvastatin (ZOCOR) 20 MG tablet TAKE 1 TABLET BY MOUTH EVERY DAY FOR CHOLESTEROL 90 tablet 1   tirzepatide (MOUNJARO) 7.5 MG/0.5ML Pen INJECT 7.5 MG INTO THE SKIN ONCE A WEEK. FOR DIABETES. 6 mL 0   fluticasone (FLONASE) 50 MCG/ACT nasal spray USE 2 SPRAYS IN EACH NOSTRIL EVERY DAY (Patient not taking: Reported on 12/17/2022) 48 g 1   No current  facility-administered medications on file prior to visit.    BP 136/82   Pulse 76   Temp 98.2 F (36.8 C) (Temporal)   Ht 6' (1.829 m)   Wt 200 lb (90.7 kg)   SpO2 97%   BMI  27.12 kg/m  Objective:   Physical Exam Cardiovascular:     Rate and Rhythm: Normal rate and regular rhythm.  Pulmonary:     Effort: Pulmonary effort is normal.     Breath sounds: Normal breath sounds. No wheezing or rales.  Musculoskeletal:     Cervical back: Neck supple.  Skin:    General: Skin is warm and dry.  Neurological:     Mental Status: He is alert and oriented to person, place, and time.           Assessment & Plan:  Type 2 diabetes mellitus with hyperglycemia, with long-term current use of insulin Assessment & Plan: Improved with A1C of 7.4 today.  Continue Mounjaro 7.5 mg weekly, Lantus 25 units BID, Glipizide 10 mg BID, metformin 1000 mg BID.  Discussed to update eye exam.  Follow up in late June/early July.  Orders: -     POCT glycosylated hemoglobin (Hb A1C)  Essential hypertension -     hydroCHLOROthiazide; TAKE 1 TABLET BY MOUTH EVERY MORNING FOR BLOOD PRESSURE  Dispense: 90 tablet; Refill: 0  Type 2 diabetes mellitus without complication, with long-term current use of insulin -     metFORMIN HCl; Take 2 tablets (1,000 mg total) by mouth 2 (two) times daily with a meal. For diabetes.  Dispense: 360 tablet; Refill: 1  Type 2 diabetes mellitus without complication -     glipiZIDE; Take 1 tablet (10 mg total) by mouth 2 (two) times daily before a meal. for diabetes.  Dispense: 180 tablet; Refill: [place], NP

## 2022-12-17 NOTE — Patient Instructions (Addendum)
Start walking everyday.  Continue to work on [employer].  Please schedule a physical to meet with me in 3 months.   It was a pleasure to see you today!

## 2023-01-02 DIAGNOSIS — E1165 Type 2 diabetes mellitus with hyperglycemia: Secondary | ICD-10-CM

## 2023-01-02 DIAGNOSIS — E119 Type 2 diabetes mellitus without complications: Secondary | ICD-10-CM

## 2023-01-03 MED ORDER — METFORMIN HCL ER 500 MG PO TB24
1000.0000 mg | ORAL_TABLET | Freq: Two times a day (BID) | ORAL | 1 refills | Status: DC
Start: 2023-01-03 — End: 2023-06-26

## 2023-01-03 MED ORDER — FREESTYLE LIBRE 3 SENSOR MISC
1 refills | Status: DC
Start: 2023-01-03 — End: 2023-08-03

## 2023-01-20 ENCOUNTER — Telehealth: Payer: Self-pay

## 2023-01-20 NOTE — Telephone Encounter (Signed)
Good Morning Jessica,   I am reaching out to see if you could possibly help me. Dr. Marina Goodell is out of office until 5/13 and this pt has a PV with [place] on 5/9. Direct colon. Pt is 77 and I know per protocol he would need and OV. I contacted the pt and he is very adamant about keeping his procedure date and time as is. Pt states he just turned 77 when I made him aware of this. Requesting [place] to reach out to a provider to see if it is necessary that he come in. His last colon was with Dr. Ewing Schlein in 2006. He reports not having any gi related issues. Please advise if possible.  Thank you, Annie Sable Pre-Visit

## 2023-01-23 ENCOUNTER — Ambulatory Visit (AMBULATORY_SURGERY_CENTER): Payer: PPO

## 2023-01-23 ENCOUNTER — Encounter: Payer: Self-pay | Admitting: Internal Medicine

## 2023-01-23 VITALS — Ht 72.0 in | Wt 185.8 lb

## 2023-01-23 DIAGNOSIS — Z1211 Encounter for screening for malignant neoplasm of colon: Secondary | ICD-10-CM

## 2023-01-23 MED ORDER — PEG 3350-KCL-NA BICARB-NACL 420 G PO SOLR
4000.0000 mL | Freq: Once | ORAL | 0 refills | Status: AC
Start: 2023-01-23 — End: 2023-01-23

## 2023-01-23 NOTE — Progress Notes (Signed)
No egg or soy allergy known to patient  No issues known to pt with past sedation with any surgeries or procedures Patient denies ever being told they had issues or difficulty with intubation  No FH of Malignant Hyperthermia Pt is not on diet pills Pt is not on  home 02  Pt is not on blood thinners  Pt denies issues with constipation  No A fib or A flutter Have any cardiac testing pending--no  Pt denies any issues with mobility   Patient's chart reviewed by Cathlyn Parsons CNRA prior to previsit and patient appropriate for the [facility].  Previsit completed and red dot placed by patient's name on their procedure day (on provider's schedule).     PV complete with patient. Prep reviewed. Instructions sent via mychart and to mailing address. [facility].

## 2023-02-12 ENCOUNTER — Ambulatory Visit (AMBULATORY_SURGERY_CENTER): Payer: PPO | Admitting: Internal Medicine

## 2023-02-12 ENCOUNTER — Encounter: Payer: Self-pay | Admitting: Internal Medicine

## 2023-02-12 VITALS — BP 157/55 | HR 63 | Temp 97.1°F | Resp 11 | Ht 72.0 in | Wt 186.2 lb

## 2023-02-12 DIAGNOSIS — I10 Essential (primary) hypertension: Secondary | ICD-10-CM | POA: Diagnosis not present

## 2023-02-12 DIAGNOSIS — D120 Benign neoplasm of cecum: Secondary | ICD-10-CM

## 2023-02-12 DIAGNOSIS — D122 Benign neoplasm of ascending colon: Secondary | ICD-10-CM

## 2023-02-12 DIAGNOSIS — D123 Benign neoplasm of transverse colon: Secondary | ICD-10-CM

## 2023-02-12 DIAGNOSIS — D125 Benign neoplasm of sigmoid colon: Secondary | ICD-10-CM | POA: Diagnosis not present

## 2023-02-12 DIAGNOSIS — Z86010 Personal history of colonic polyps: Secondary | ICD-10-CM | POA: Diagnosis not present

## 2023-02-12 DIAGNOSIS — Z1211 Encounter for screening for malignant neoplasm of colon: Secondary | ICD-10-CM

## 2023-02-12 DIAGNOSIS — Z09 Encounter for follow-up examination after completed treatment for conditions other than malignant neoplasm: Secondary | ICD-10-CM | POA: Diagnosis not present

## 2023-02-12 DIAGNOSIS — E119 Type 2 diabetes mellitus without complications: Secondary | ICD-10-CM | POA: Diagnosis not present

## 2023-02-12 MED ORDER — SODIUM CHLORIDE 0.9 % IV SOLN: 500.0000 mL | Freq: Once | INTRAVENOUS | Status: DC

## 2023-02-12 NOTE — Op Note (Signed)
[facility] Patient Name: Lawrence Parker Procedure Date: 02/12/2023 8:04 AM MRN: [id number] Endoscopist: Wilhemina Bonito. Marina Goodell , MD, [id number] Age: 77 Referring MD:  Date of Birth: 1946-05-04 Gender: Male Account #: [id number] Procedure:                Colonoscopy with cold snare polypectomy x 10;                            biopsy polypectomy x 1 Indications:              High risk colon cancer surveillance: Personal                            history of non-advanced adenoma. Previous                            examinations 2002 and 2006 elsewhere (Dr. Ewing Schlein) Medicines:                Monitored Anesthesia Care Procedure:                Pre-Anesthesia Assessment:                           - Prior to the procedure, a History and Physical                            was performed, and patient medications and                            allergies were reviewed. The patient's tolerance of                            previous anesthesia was also reviewed. The risks                            and benefits of the procedure and the sedation                            options and risks were discussed with the patient.                            All questions were answered, and informed consent                            was obtained. Prior Anticoagulants: The patient has                            taken no anticoagulant or antiplatelet agents.                            After reviewing the risks and benefits, the patient                            was deemed in satisfactory condition to undergo the  procedure.                           After obtaining informed consent, the colonoscope                            was passed under direct vision. Throughout the                            procedure, the patient's blood pressure, pulse, and                            oxygen saturations were monitored continuously. The                            Olympus [id number])  Colonoscope was                            introduced through the anus and advanced to the the                            cecum, identified by appendiceal orifice and                            ileocecal valve. The ileocecal valve, appendiceal                            orifice, and rectum were photographed. The quality                            of the bowel preparation was excellent. The                            colonoscopy was performed without difficulty. The                            patient tolerated the procedure well. The bowel                            preparation used was SUPREP via split dose                            instruction. Scope In: 8:47:59 AM Scope Out: 9:10:30 AM Scope Withdrawal Time: 0 hours 18 minutes 1 second  Total Procedure Duration: 0 hours 22 minutes 31 seconds  Findings:                 Ten polyps were found in the sigmoid colon,                            transverse colon and ascending colon. The polyps                            were 3 to 8 mm in size. These polyps were removed  with a cold snare. Resection and retrieval were                            complete.                           A 1 mm polyp was found in the cecum. The polyp was                            removed with a jumbo cold forceps. Resection and                            retrieval were complete.                           Multiple diverticula were found in the left colon                            and right colon.                           Internal hemorrhoids were found during                            retroflexion. The hemorrhoids were moderate.                           The exam was otherwise without abnormality on                            direct and retroflexion views. Complications:            No immediate complications. Estimated blood loss:                            None. Estimated Blood Loss:     Estimated blood loss: none. Impression:                - Ten 3 to 8 mm polyps in the sigmoid colon, in the                            transverse colon and in the ascending colon,                            removed with a cold snare. Resected and retrieved.                           - One 1 mm polyp in the cecum, removed with a jumbo                            cold forceps. Resected and retrieved.                           - Diverticulosis in the left colon and in the right  colon.                           - Internal hemorrhoids.                           - The examination was otherwise normal on direct                            and retroflexion views. Recommendation:           - Repeat colonoscopy in 1 year for surveillance.                           - Patient has a contact number available for                            emergencies. The signs and symptoms of potential                            delayed complications were discussed with the                            patient. Return to normal activities tomorrow.                            Written discharge instructions were provided to the                            patient.                           - Resume previous diet.                           - Continue present medications.                           - Await pathology results. Wilhemina Bonito. Marina Goodell, MD 02/12/2023 9:22:16 AM This report has been signed electronically.

## 2023-02-12 NOTE — Progress Notes (Signed)
HISTORY OF PRESENT ILLNESS:  Lawrence Parker is a 77 y.o. male With a history of adenomatous colon polyps.  Previous examinations 2002 and 2006 elsewhere.  Now for surveillance  REVIEW OF SYSTEMS:  All non-GI ROS negative except for  Past Medical History:  Diagnosis Date   Anxiety    takes Xanax prn   Arthritis    neck   Back pain    deteriorating disc   Bladder neck obstruction    Colon polyps    Depression    hx of but doesn't take any medications   Diabetes mellitus    takes Actos,Glipizide,Metformin,and Lantus   Diverticula of colon    History of kidney stones    also has one at present time    Hyperlipidemia    takes Zocor daily   Hypertension    takes Amlodipine daily and HCTZ daily   Neck pain    cervical spondylosis   Peyronie disease     Past Surgical History:  Procedure Laterality Date   CERVICAL SPINE SURGERY     CIRCUMCISION  20+yrs ago   COLONOSCOPY     NOSE SURGERY  20+yrs ago   SHOULDER SURGERY     right 20+yrs ago    Social History Lawrence Parker  reports that he has quit smoking. His smoking use included cigarettes. He smoked an average of 1 pack per day. He has never used smokeless tobacco. He reports that he does not drink alcohol and does not use drugs.  family history includes Cancer in his father, mother, sister, and sister; Colon cancer in his father, sister, and sister; Colon polyps in his brother; Diabetes in his brother, mother, and sister; Lung cancer in his father.  No Known Allergies     PHYSICAL EXAMINATION: Vital signs: BP (!) (P) 176/80 Comment: BPCuff R calf  Pulse 75   Temp (!) 97.1 F (36.2 C)   Resp 15   Ht 6' (1.829 m)   Wt 186 lb 3.2 oz (84.5 kg)   SpO2 99%   BMI 25.25 kg/m  General: Well-developed, well-nourished, no acute distress HEENT: Sclerae are anicteric, conjunctiva pink. Oral mucosa intact Lungs: Clear Heart: Regular Abdomen: soft, nontender, nondistended, no obvious ascites, no peritoneal signs,  normal bowel sounds. No organomegaly. Extremities: No edema Psychiatric: alert and oriented x3. Cooperative     ASSESSMENT:   History of adenomatous colon polyp  PLAN:  Surveillance colonoscopy

## 2023-02-12 NOTE — Progress Notes (Signed)
Pt's states no medical or surgical changes since previsit or office visit. 

## 2023-02-12 NOTE — Progress Notes (Signed)
Report to PACU, RN, vss, BBS= Clear.  

## 2023-02-12 NOTE — Progress Notes (Signed)
One L fluid in at [phone]

## 2023-02-12 NOTE — Patient Instructions (Addendum)
-  Handout on polyps, diverticulosis and hemorrhoids provided -await pathology results -repeat colonoscopy in 1 year for surveillance  -Continue present medications   YOU HAD AN ENDOSCOPIC PROCEDURE TODAY AT THE [facility]:   Refer to the procedure report that was given to you for any specific questions about what was found during the examination.  If the procedure report does not answer your questions, please call your gastroenterologist to clarify.  If you requested that your care partner not be given the details of your procedure findings, then the procedure report has been included in a sealed envelope for you to review at your convenience later.  YOU SHOULD EXPECT: Some feelings of bloating in the abdomen. Passage of more gas than usual.  Walking can help get rid of the air that was put into your GI tract during the procedure and reduce the bloating. If you had a lower endoscopy (such as a colonoscopy or flexible sigmoidoscopy) you may notice spotting of blood in your stool or on the toilet paper. If you underwent a bowel prep for your procedure, you may not have a normal bowel movement for a few days.  Please Note:  You might notice some irritation and congestion in your nose or some drainage.  This is from the oxygen used during your procedure.  There is no need for concern and it should clear up in a day or so.  SYMPTOMS TO REPORT IMMEDIATELY:  Following lower endoscopy (colonoscopy or flexible sigmoidoscopy):  Excessive amounts of blood in the stool  Significant tenderness or worsening of abdominal pains  Swelling of the abdomen that is new, acute  Fever of 100F or higher   For urgent or emergent issues, a gastroenterologist can be reached at any hour by calling [phone]. Do not use MyChart messaging for urgent concerns.    DIET:  We do recommend a small meal at first, but then you may proceed to your regular diet.  Drink plenty of fluids but you should avoid  alcoholic beverages for 24 hours.  ACTIVITY:  You should plan to take it easy for the rest of today and you should NOT DRIVE or use heavy machinery until tomorrow (because of the sedation medicines used during the test).    FOLLOW UP: Our staff will call the number listed on your records the next business day following your procedure.  We will call around 7:15- 8:00 am to check on you and address any questions or concerns that you may have regarding the information given to you following your procedure. If we do not reach you, we will leave a message.     If any biopsies were taken you will be contacted by phone or by letter within the next 1-3 weeks.  Please call us at [phone] if you have not heard about the biopsies in 3 weeks.    SIGNATURES/CONFIDENTIALITY: You and/or your care partner have signed paperwork which will be entered into your electronic medical record.  These signatures attest to the fact that that the information above on your After Visit Summary has been reviewed and is understood.  Full responsibility of the confidentiality of this discharge information lies with you and/or your care-partner.

## 2023-02-13 ENCOUNTER — Telehealth: Payer: Self-pay

## 2023-02-13 NOTE — Telephone Encounter (Signed)
  Follow up Call-     02/12/2023    7:15 AM  Call back number  Post procedure Call Back phone  # [phone]  Permission to leave phone message Yes     Left message

## 2023-02-14 ENCOUNTER — Encounter: Payer: Self-pay | Admitting: Internal Medicine

## 2023-02-17 ENCOUNTER — Other Ambulatory Visit: Payer: Self-pay | Admitting: Primary Care

## 2023-02-17 DIAGNOSIS — E1165 Type 2 diabetes mellitus with hyperglycemia: Secondary | ICD-10-CM

## 2023-02-18 ENCOUNTER — Telehealth: Payer: Self-pay | Admitting: Primary Care

## 2023-02-18 DIAGNOSIS — E785 Hyperlipidemia, unspecified: Secondary | ICD-10-CM

## 2023-02-18 NOTE — Telephone Encounter (Signed)
Patient contacted the office regarding medication mounjaro, states he went to pick up medication and the total cost was $750. Patient wants to know if there is anything they could apply for to help with costs of this medication? Please advise, thank you.

## 2023-02-18 NOTE — Telephone Encounter (Signed)
Patient returned call, would like a call back whenever possible. Please advise [phone], thank you.

## 2023-02-18 NOTE — Telephone Encounter (Signed)
Was his insurance paying for [facility] previously? Is he in the donut hole now with his insurance company?  Please have him contact his insurance company to find out what is going on.

## 2023-02-18 NOTE — Telephone Encounter (Signed)
Unable to reach patient. Left voicemail to return call to our office.   

## 2023-02-19 MED ORDER — SIMVASTATIN 20 MG PO TABS: ORAL_TABLET | ORAL | 0 refills | Status: DC

## 2023-02-19 NOTE — Telephone Encounter (Signed)
Noted. Refill(s) sent to pharmacy.  

## 2023-02-19 NOTE — Telephone Encounter (Signed)
Patient states insurance was previously covering it and he thinks he is in the donut hole. He will call his insurance company and find out what's going on.   Requested refill on Simvastatin to go to [facility]

## 2023-02-19 NOTE — Addendum Note (Signed)
Addended by: Doreene Nest on: 02/19/2023 12:55 PM   Modules accepted: Orders

## 2023-03-13 ENCOUNTER — Encounter: Payer: Self-pay | Admitting: Primary Care

## 2023-03-13 ENCOUNTER — Ambulatory Visit (INDEPENDENT_AMBULATORY_CARE_PROVIDER_SITE_OTHER): Payer: PPO | Admitting: Primary Care

## 2023-03-13 VITALS — BP 134/56 | HR 72 | Temp 97.0°F | Ht 72.0 in | Wt 183.0 lb

## 2023-03-13 DIAGNOSIS — Z23 Encounter for immunization: Secondary | ICD-10-CM

## 2023-03-13 DIAGNOSIS — Z0000 Encounter for general adult medical examination without abnormal findings: Secondary | ICD-10-CM | POA: Diagnosis not present

## 2023-03-13 DIAGNOSIS — M1712 Unilateral primary osteoarthritis, left knee: Secondary | ICD-10-CM

## 2023-03-13 DIAGNOSIS — E1165 Type 2 diabetes mellitus with hyperglycemia: Secondary | ICD-10-CM

## 2023-03-13 DIAGNOSIS — Z794 Long term (current) use of insulin: Secondary | ICD-10-CM | POA: Diagnosis not present

## 2023-03-13 DIAGNOSIS — I10 Essential (primary) hypertension: Secondary | ICD-10-CM | POA: Diagnosis not present

## 2023-03-13 DIAGNOSIS — Z125 Encounter for screening for malignant neoplasm of prostate: Secondary | ICD-10-CM

## 2023-03-13 DIAGNOSIS — E785 Hyperlipidemia, unspecified: Secondary | ICD-10-CM

## 2023-03-13 LAB — COMPREHENSIVE METABOLIC PANEL
ALT: 12 U/L (ref 0–53)
AST: 13 U/L (ref 0–37)
Albumin: 4.1 g/dL (ref 3.5–5.2)
Alkaline Phosphatase: 46 U/L (ref 39–117)
BUN: 20 mg/dL (ref 6–23)
CO2: 29 mEq/L (ref 19–32)
Calcium: 9.2 mg/dL (ref 8.4–10.5)
Chloride: 102 mEq/L (ref 96–112)
Creatinine, Ser: 1.11 mg/dL (ref 0.40–1.50)
GFR: 64.65 mL/min (ref >60.00)
Glucose, Bld: 131 mg/dL — ABNORMAL HIGH (ref 70–99)
Potassium: 3.3 mEq/L — ABNORMAL LOW (ref 3.5–5.1)
Sodium: 141 mEq/L (ref 135–145)
Total Bilirubin: 0.9 mg/dL (ref 0.2–1.2)
Total Protein: 6.4 g/dL (ref 6.0–8.3)

## 2023-03-13 LAB — HEMOGLOBIN A1C: Hgb A1c MFr Bld: 6.8 % — ABNORMAL HIGH (ref 4.6–6.5)

## 2023-03-13 LAB — LIPID PANEL
Cholesterol: 90 mg/dL (ref 0–200)
HDL: 35.2 mg/dL — ABNORMAL LOW (ref >39.00)
LDL Cholesterol: 34 mg/dL (ref 0–99)
NonHDL: 54.54
Total CHOL/HDL Ratio: 3
Triglycerides: 104 mg/dL (ref 0.0–149.0)
VLDL: 20.8 mg/dL (ref 0.0–40.0)

## 2023-03-13 LAB — TSH: TSH: 1.55 u[IU]/mL (ref 0.35–5.50)

## 2023-03-13 LAB — PSA, MEDICARE: PSA: 1.32 ng/ml (ref 0.10–4.00)

## 2023-03-13 NOTE — Assessment & Plan Note (Signed)
Repeat lipid panel pending. Continue simvastatin 20 mg daily.  

## 2023-03-13 NOTE — Progress Notes (Signed)
Subjective:    Patient ID: Lawrence Parker, male    DOB: [birth year], 77 y.o.   MRN: [id number]  HPI  Lawrence Parker is a very pleasant 77 y.o. male who presents today for complete physical and follow up of chronic conditions.  Immunizations: -Shingles: Completed Shingrix series -Pneumonia: Completed Prevnar 13 in 2017, pneumovax 23 in 2013  Diet: Fair diet.  Exercise: No regular exercise.  Eye exam: Completes annually  Dental exam: Completed 6 months ago  Colonoscopy: Completed in 2024, due May 2025  PSA: Due   BP Readings from Last 3 Encounters:  03/13/23 (!) 134/56  02/12/23 (!) 157/55  12/17/22 136/82        Review of Systems  Constitutional:  Negative for unexpected weight change.  HENT:  Negative for rhinorrhea.   Respiratory:  Negative for cough and shortness of breath.   Cardiovascular:  Negative for chest pain.  Gastrointestinal:  Negative for constipation and diarrhea.  Genitourinary:  Negative for difficulty urinating.  Musculoskeletal:  Positive for arthralgias.  Skin:  Negative for rash.  Allergic/Immunologic: Negative for environmental allergies.  Neurological:  Negative for dizziness and headaches.  Psychiatric/Behavioral:  The patient is not nervous/anxious.          Past Medical History:  Diagnosis Date   Ankle effusion, right 04/13/2018   Anxiety    takes Xanax prn   Arthritis    neck   Back pain    deteriorating disc   Bilateral lower extremity edema 03/20/2017   Bladder neck obstruction    Colon polyps    Depression    hx of but doesn't take any medications   Diabetes mellitus    takes Actos,Glipizide,Metformin,and Lantus   Diverticula of colon    Frontal headache 06/06/2021   History of kidney stones    also has one at present time    Hyperlipidemia    takes Zocor daily   Hypertension    takes Amlodipine daily and HCTZ daily   Neck pain    cervical spondylosis   Peyronie disease     Social History    Socioeconomic History   Marital status: Married    Spouse name: Not on file   Number of children: Not on file   Years of education: Not on file   Highest education level: 9th grade  Occupational History   Occupation: retired  Tobacco Use   Smoking status: Former    Packs/day: 1    Types: Cigarettes   Smokeless tobacco: Never   Tobacco comments:    quit smoking 53yrs ago  Building services engineer Use: Never used  Substance and Sexual Activity   Alcohol use: No   Drug use: No   Sexual activity: Not Currently  Other Topics Concern   Not on file  Social History Narrative   Married.   2 children. 2 grandchildren.   Retired. Drove trucks for 43 years.   Enjoys traveling, spending time with family.   Social Determinants of Health   Financial Resource Strain: Low Risk  (03/12/2023)   Overall Financial Resource Strain (CARDIA)    Difficulty of Paying Living Expenses: Not very hard  Food Insecurity: No Food Insecurity (03/12/2023)   Hunger Vital Sign    Worried About Running Out of Food in the Last Year: Never true    Ran Out of Food in the Last Year: Never true  Transportation Needs: No Transportation Needs (03/12/2023)   PRAPARE - Transportation    Lack of  Transportation (Medical): No    Lack of Transportation (Non-Medical): No  Physical Activity: Inactive (03/12/2023)   Exercise Vital Sign    Days of Exercise per Week: 0 days    Minutes of Exercise per Session: 60 min  Stress: Stress Concern Present (03/12/2023)   [employer] of Occupational Health - Occupational Stress Questionnaire    Feeling of Stress : To some extent  Social Connections: Moderately Isolated (03/12/2023)   Social Connection and Isolation Panel [NHANES]    Frequency of Communication with Friends and Family: More than three times a week    Frequency of Social Gatherings with Friends and Family: More than three times a week    Attends Religious Services: Never    Database administrator or Organizations:  No    Attends Banker Meetings: Never    Marital Status: Married  Catering manager Violence: Not At Risk (07/16/2022)   Humiliation, Afraid, Rape, and Kick questionnaire    Fear of Current or Ex-Partner: No    Emotionally Abused: No    Physically Abused: No    Sexually Abused: No    Past Surgical History:  Procedure Laterality Date   CERVICAL SPINE SURGERY     CIRCUMCISION  20+yrs ago   COLONOSCOPY     NOSE SURGERY  20+yrs ago   SHOULDER SURGERY     right 20+yrs ago    Family History  Problem Relation Age of Onset   Cancer Mother    Diabetes Mother    Colon cancer Father    Cancer Father    Lung cancer Father    Colon cancer Sister    Cancer Sister    Colon cancer Sister    Diabetes Sister    Cancer Sister    Colon polyps Brother    Diabetes Brother    Anesthesia problems Neg Hx    Hypotension Neg Hx    Malignant hyperthermia Neg Hx    Pseudochol deficiency Neg Hx     No Known Allergies  Current Outpatient Medications on File Prior to Visit  Medication Sig Dispense Refill   Alcohol Swabs (DROPSAFE ALCOHOL PREP) 70 % PADS USE AS DIRECTED AS NEEDED 400 each 0   amLODipine (NORVASC) 10 MG tablet TAKE 1 TABLET BY MOUTH EVERY DAY FOR BLOOD PRESSURE 90 tablet 2   aspirin 325 MG tablet Take 325 mg by mouth daily.     Blood Glucose Calibration (ACCU-CHEK AVIVA) SOLN Use as instructed. 1 each 1   Blood Glucose Monitoring Suppl (ACCU-CHEK AVIVA PLUS) w/Device KIT Check blood sugar twice a day and as directed. Dx E11.9 1 kit 0   Continuous Glucose Sensor (FREESTYLE LIBRE 3 SENSOR) MISC Place 1 sensor on the skin every 14 days. Use to check glucose continuously 6 each 1   fluticasone (FLONASE) 50 MCG/ACT nasal spray USE 2 SPRAYS IN EACH NOSTRIL EVERY DAY 48 g 1   glipiZIDE (GLUCOTROL) 10 MG tablet Take 1 tablet (10 mg total) by mouth 2 (two) times daily before a meal. for diabetes. 180 tablet 1   glucose blood (ACCU-CHEK AVIVA PLUS) test strip TEST BLOOD SUGAR  TWICE DAILY AS DIRECTED 200 strip 5   hydrochlorothiazide (HYDRODIURIL) 25 MG tablet TAKE 1 TABLET BY MOUTH EVERY MORNING FOR BLOOD PRESSURE 90 tablet 0   insulin glargine (LANTUS SOLOSTAR) 100 UNIT/ML Solostar Pen Inject 30 units in the morning and 25 units in the evening for diabetes. (Patient taking differently: Inject 15-20 units in the morning and 15-20 units  in the evening for diabetes.) 60 mL 1   Insulin Pen Needle 31G X 8 MM MISC Use as directed to injection insulin. 200 each 3   Lancets (ACCU-CHEK SOFT TOUCH) lancets Check blood sugar twice a day and as directed. Dx E11.9 300 each 2   lisinopril (ZESTRIL) 10 MG tablet TAKE 1 TABLET EVERY DAY 90 tablet 1   metFORMIN (GLUCOPHAGE-XR) 500 MG 24 hr tablet Take 2 tablets (1,000 mg total) by mouth 2 (two) times daily with a meal. for diabetes. 360 tablet 1   simvastatin (ZOCOR) 20 MG tablet TAKE 1 TABLET BY MOUTH EVERY DAY FOR CHOLESTEROL 90 tablet 0   tirzepatide (MOUNJARO) 7.5 MG/0.5ML Pen INJECT 7.5 MG INTO THE SKIN ONCE A WEEK. FOR DIABETES. 6 mL 0   No current facility-administered medications on file prior to visit.    BP (!) 134/56   Pulse 72   Temp (!) 97 F (36.1 C) (Temporal)   Ht 6' (1.829 m)   Wt 183 lb (83 kg)   SpO2 98%   BMI 24.82 kg/m  Objective:   Physical Exam HENT:     Right Ear: Tympanic membrane and ear canal normal.     Left Ear: Tympanic membrane and ear canal normal.     Nose: Nose normal.     Right Sinus: No maxillary sinus tenderness or frontal sinus tenderness.     Left Sinus: No maxillary sinus tenderness or frontal sinus tenderness.  Eyes:     Conjunctiva/sclera: Conjunctivae normal.  Neck:     Thyroid: No thyromegaly.     Vascular: No carotid bruit.  Cardiovascular:     Rate and Rhythm: Normal rate and regular rhythm.     Heart sounds: Normal heart sounds.  Pulmonary:     Effort: Pulmonary effort is normal.     Breath sounds: Normal breath sounds. No wheezing or rales.  Abdominal:     General:  Bowel sounds are normal.     Palpations: Abdomen is soft.     Tenderness: There is no abdominal tenderness.  Musculoskeletal:        General: Normal range of motion.     Cervical back: Neck supple.  Skin:    General: Skin is warm and dry.  Neurological:     Mental Status: He is alert and oriented to person, place, and time.     Cranial Nerves: No cranial nerve deficit.     Deep Tendon Reflexes: Reflexes are normal and symmetric.  Psychiatric:        Mood and Affect: Mood normal.           Assessment & Plan:  Preventative health care Assessment & Plan: Immunizations UTD. Updated Pneumonia vaccine with Prevnar 20 Colonoscopy UTD, due May 2025 PSA due and pending.  Discussed the importance of a healthy diet and regular exercise in order for weight loss, and to reduce the risk of further co-morbidity.  Exam stable. Labs pending.  Follow up in 1 year for repeat physical.    Essential hypertension Assessment & Plan: Controlled.  Continue amlodipine 10 mg daily, hydrochlorothiazide 25 mg daily, lisinopril 10 mg daily CMP pending.  Orders: -     Comprehensive metabolic panel -     TSH  Type 2 diabetes mellitus with hyperglycemia, with long-term current use of insulin (HCC) Assessment & Plan: Repeat A1C pending.  Continue Mounjaro 7.5 mg weekly, Glipizide 10 mg BID, metformin XR 1000 mg BID, Lantus 30 units in AM and 25 units in  PM.  Follow up in 3-6 months based on A1C result.   Orders: -     Hemoglobin A1c  Hyperlipidemia, unspecified hyperlipidemia type Assessment & Plan: Repeat lipid panel pending.  Continue simvastatin 20 mg daily.    Orders: -     Lipid panel  Primary osteoarthritis of left knee Assessment & Plan: Chronic and continued, although he leads a sedentary life. Recommended he use his bicycle and start walking daily.  No other concerns.   Screening for prostate cancer -     PSA, Medicare        Doreene Nest, NP

## 2023-03-13 NOTE — Assessment & Plan Note (Signed)
Controlled.  Continue amlodipine 10 mg daily, hydrochlorothiazide 25 mg daily, lisinopril 10 mg daily CMP pending.

## 2023-03-13 NOTE — Assessment & Plan Note (Addendum)
Repeat A1C pending.  Continue Mounjaro 7.5 mg weekly, Glipizide 10 mg BID, metformin XR 1000 mg BID, Lantus 30 units in AM and 25 units in PM.  Follow up in 3-6 months based on A1C result.

## 2023-03-13 NOTE — Patient Instructions (Signed)
Stop by the lab prior to leaving today. I will notify you of your results once received.   Please schedule a follow up visit for 6 months for a diabetes check.  It was a pleasure to see you today!   

## 2023-03-13 NOTE — Assessment & Plan Note (Signed)
Immunizations UTD. Updated Pneumonia vaccine with Prevnar 20 Colonoscopy UTD, due May 2025 PSA due and pending.  Discussed the importance of a healthy diet and regular exercise in order for weight loss, and to reduce the risk of further co-morbidity.  Exam stable. Labs pending.  Follow up in 1 year for repeat physical.

## 2023-03-13 NOTE — Assessment & Plan Note (Signed)
Chronic and continued, although he leads a sedentary life. Recommended he use his bicycle and start walking daily.  No other concerns.

## 2023-03-17 ENCOUNTER — Ambulatory Visit (INDEPENDENT_AMBULATORY_CARE_PROVIDER_SITE_OTHER): Payer: PPO

## 2023-03-17 VITALS — Wt 183.0 lb

## 2023-03-17 DIAGNOSIS — Z0000 Encounter for general adult medical examination without abnormal findings: Secondary | ICD-10-CM | POA: Diagnosis not present

## 2023-03-17 NOTE — Progress Notes (Signed)
Subjective:   Lawrence Parker is a 77 y.o. male who presents for Medicare Annual/Subsequent preventive examination.  Visit Complete: Virtual  I connected with  [name] on 03/17/23 by a audio enabled telemedicine application and verified that I am speaking with the correct person using two identifiers.  Patient Location: Home  Provider Location: Office/Clinic  I discussed the limitations of evaluation and management by telemedicine. The patient expressed understanding and agreed to proceed.   Review of Systems     Cardiac Risk Factors include: advanced age (>11men, >41 women);hypertension;male gender;dyslipidemia;diabetes mellitus     Objective:    Today's Vitals   03/17/23 1409  Weight: 183 lb (83 kg)   Body mass index is 24.82 kg/m.     03/17/2023    2:14 PM 07/16/2022   10:55 AM 07/10/2021   12:09 PM 02/15/2020    9:01 AM 04/23/2019    9:20 AM 04/20/2018   12:32 PM 11/15/2011    2:00 PM  Advanced Directives  Does Patient Have a Medical Advance Directive? No No No No No Yes Patient does not have advance directive  Type of Chartered certified accountant Power of [place];Living will   Copy of Healthcare Power of Attorney in Chart?      No - copy requested   Would patient like information on creating a medical advance directive? No - Patient declined No - Patient declined Yes (MAU/Ambulatory/Procedural Areas - Information given) No - Patient declined     Pre-existing out of facility DNR order (yellow form or pink MOST form)       No    Current Medications (verified) Outpatient Encounter Medications as of 03/17/2023  Medication Sig   Alcohol Swabs (DROPSAFE ALCOHOL PREP) 70 % PADS USE AS DIRECTED AS NEEDED   amLODipine (NORVASC) 10 MG tablet TAKE 1 TABLET BY MOUTH EVERY DAY FOR BLOOD PRESSURE   aspirin 325 MG tablet Take 325 mg by mouth daily.   Blood Glucose Calibration (ACCU-CHEK AVIVA) SOLN Use as instructed.   Blood Glucose Monitoring Suppl (ACCU-CHEK AVIVA  PLUS) w/Device KIT Check blood sugar twice a day and as directed. Dx E11.9   Continuous Glucose Sensor (FREESTYLE LIBRE 3 SENSOR) MISC Place 1 sensor on the skin every 14 days. Use to check glucose continuously   fluticasone (FLONASE) 50 MCG/ACT nasal spray USE 2 SPRAYS IN EACH NOSTRIL EVERY DAY   glipiZIDE (GLUCOTROL) 10 MG tablet Take 1 tablet (10 mg total) by mouth 2 (two) times daily before a meal. for diabetes.   glucose blood (ACCU-CHEK AVIVA PLUS) test strip TEST BLOOD SUGAR TWICE DAILY AS DIRECTED   hydrochlorothiazide (HYDRODIURIL) 25 MG tablet TAKE 1 TABLET BY MOUTH EVERY MORNING FOR BLOOD PRESSURE   insulin glargine (LANTUS SOLOSTAR) 100 UNIT/ML Solostar Pen Inject 30 units in the morning and 25 units in the evening for diabetes. (Patient taking differently: Inject 15-20 units in the morning and 15-20 units in the evening for diabetes.)   Insulin Pen Needle 31G X 8 MM MISC Use as directed to injection insulin.   Lancets (ACCU-CHEK SOFT TOUCH) lancets Check blood sugar twice a day and as directed. Dx E11.9   lisinopril (ZESTRIL) 10 MG tablet TAKE 1 TABLET EVERY DAY   metFORMIN (GLUCOPHAGE-XR) 500 MG 24 hr tablet Take 2 tablets (1,000 mg total) by mouth 2 (two) times daily with a meal. for diabetes.   simvastatin (ZOCOR) 20 MG tablet TAKE 1 TABLET BY MOUTH EVERY DAY FOR CHOLESTEROL  tirzepatide (MOUNJARO) 7.5 MG/0.5ML Pen INJECT 7.5 MG INTO THE SKIN ONCE A WEEK. FOR DIABETES.   No facility-administered encounter medications on file as of 03/17/2023.    Allergies (verified) Patient has no known allergies.   History: Past Medical History:  Diagnosis Date   Ankle effusion, right 04/13/2018   Anxiety    takes Xanax prn   Arthritis    neck   Back pain    deteriorating disc   Bilateral lower extremity edema 03/20/2017   Bladder neck obstruction    Colon polyps    Depression    hx of but doesn't take any medications   Diabetes mellitus    takes Actos,Glipizide,Metformin,and  Lantus   Diverticula of colon    Frontal headache 06/06/2021   History of kidney stones    also has one at present time    Hyperlipidemia    takes Zocor daily   Hypertension    takes Amlodipine daily and HCTZ daily   Neck pain    cervical spondylosis   Peyronie disease    Past Surgical History:  Procedure Laterality Date   CERVICAL SPINE SURGERY     CIRCUMCISION  20+yrs ago   COLONOSCOPY     NOSE SURGERY  20+yrs ago   SHOULDER SURGERY     right 20+yrs ago   Family History  Problem Relation Age of Onset   Cancer Mother    Diabetes Mother    Colon cancer Father    Cancer Father    Lung cancer Father    Colon cancer Sister    Cancer Sister    Colon cancer Sister    Diabetes Sister    Cancer Sister    Colon polyps Brother    Diabetes Brother    Anesthesia problems Neg Hx    Hypotension Neg Hx    Malignant hyperthermia Neg Hx    Pseudochol deficiency Neg Hx    Social History   Socioeconomic History   Marital status: Married    Spouse name: Not on file   Number of children: Not on file   Years of education: Not on file   Highest education level: 9th grade  Occupational History   Occupation: retired  Tobacco Use   Smoking status: Former    Packs/day: 1    Types: Cigarettes   Smokeless tobacco: Never   Tobacco comments:    quit smoking 65yrs ago  Building services engineer Use: Never used  Substance and Sexual Activity   Alcohol use: No   Drug use: No   Sexual activity: Not Currently  Other Topics Concern   Not on file  Social History Narrative   Married.   2 children. 2 grandchildren.   Retired. Drove trucks for 43 years.   Enjoys traveling, spending time with family.   Social Determinants of Health   Financial Resource Strain: Low Risk  (03/17/2023)   Overall Financial Resource Strain (CARDIA)    Difficulty of Paying Living Expenses: Not hard at all  Food Insecurity: No Food Insecurity (03/17/2023)   Hunger Vital Sign    Worried About Running Out of Food  in the Last Year: Never true    Ran Out of Food in the Last Year: Never true  Transportation Needs: No Transportation Needs (03/17/2023)   PRAPARE - Administrator, Civil Service (Medical): No    Lack of Transportation (Non-Medical): No  Physical Activity: Inactive (03/17/2023)   Exercise Vital Sign    Days of Exercise  per Week: 0 days    Minutes of Exercise per Session: 0 min  Stress: No Stress Concern Present (03/17/2023)   [employer] of Occupational Health - Occupational Stress Questionnaire    Feeling of Stress : Not at all  Recent Concern: Stress - Stress Concern Present (03/12/2023)   [employer] of Occupational Health - Occupational Stress Questionnaire    Feeling of Stress : To some extent  Social Connections: Moderately Isolated (03/17/2023)   Social Connection and Isolation Panel [NHANES]    Frequency of Communication with Friends and Family: More than three times a week    Frequency of Social Gatherings with Friends and Family: More than three times a week    Attends Religious Services: Never    Database administrator or Organizations: No    Attends Engineer, structural: Never    Marital Status: Married    Tobacco Counseling Counseling given: Not Answered Tobacco comments: quit smoking 49yrs ago   Clinical Intake:  Pre-visit preparation completed: Yes  Pain : No/denies pain     BMI - recorded: 24.82 Nutritional Status: BMI of 19-24  Normal Nutritional Risks: None Diabetes: Yes CBG done?: Yes (90) CBG resulted in Enter/ Edit results?: No Did pt. bring in CBG monitor from home?: No  How often do you need to have someone help you when you read instructions, pamphlets, or other written materials from your doctor or pharmacy?: 1 - Never  Interpreter Needed?: No  Information entered by :: Lanier Ensign, LPN   Activities of Daily Living    03/17/2023    2:15 PM 07/16/2022   10:56 AM  In your present state of health, do you have any  difficulty performing the following activities:  Hearing? 0 0  Vision? 0 0  Difficulty concentrating or making decisions? 0 0  Walking or climbing stairs? 0 0  Dressing or bathing? 0 0  Doing errands, shopping? 0 0  Preparing Food and eating ? N N  Using the Toilet? N N  In the past six months, have you accidently leaked urine? N N  Do you have problems with loss of bowel control? N N  Managing your Medications? N N  Managing your Finances? N N  Housekeeping or managing your Housekeeping? N N    Patient Care Team: Doreene Nest, NP as PCP - General (Internal Medicine)  Indicate any recent Medical Services you may have received from other than [facility] providers in the past year (date may be approximate).     Assessment:   This is a routine wellness examination for Chima.  Hearing/Vision screen Hearing Screening - Comments:: Pt denies any hearing issues  Vision Screening - Comments:: Pt follows up with [facility] for annual eye exams   Dietary issues and exercise activities discussed:     Goals Addressed             This Visit's Progress    Patient Stated       Get in better healthy        Depression Screen    03/17/2023    2:13 PM 03/13/2023    7:55 AM 07/16/2022   10:54 AM 03/12/2022    9:53 AM 12/07/2021    9:28 AM 07/10/2021   12:18 PM 02/22/2021    9:02 AM  PHQ 2/9 Scores  PHQ - 2 Score 0 0 0 0 0 0 1  PHQ- 9 Score   0 0   7    Fall Risk  03/17/2023    2:15 PM 03/13/2023    7:54 AM 12/17/2022    8:07 AM 07/16/2022   10:55 AM 12/07/2021    9:28 AM  Fall Risk   Falls in the past year? 0 0 0 0 0  Number falls in past yr: 0 0 0 0   Injury with Fall? 0 0 0 0   Risk for fall due to : No Fall Risks No Fall Risks No Fall Risks No Fall Risks   Follow up Falls prevention discussed Falls evaluation completed Falls evaluation completed Falls prevention discussed;Falls evaluation completed Falls evaluation completed    MEDICARE RISK AT HOME:   TIMED UP  AND GO:  Was the test performed?  No    Cognitive Function:    04/23/2019    9:26 AM 04/20/2018    3:47 PM  MMSE - Mini Mental State Exam  Orientation to time 5 5  Orientation to Place 5 5  Registration 3 3  Attention/ Calculation 0 0  Attention/Calculation-comments could not spell word   Recall 3 3  Language- name 2 objects 0 0  Language- repeat 1 1  Language- follow 3 step command 0 3  Language- read & follow direction 0 0  Write a sentence 0 0  Copy design 0 0  Total score 17 20        03/17/2023    2:16 PM 07/16/2022   10:56 AM  6CIT Screen  What Year? 0 points 0 points  What month? 0 points 0 points  What time? 0 points 0 points  Count back from 20 0 points 0 points  Months in reverse 2 points 0 points  Repeat phrase 2 points 0 points  Total Score 4 points 0 points    Immunizations Immunization History  Administered Date(s) Administered   Fluad Quad(high Dose 65+) 08/02/2019, 11/17/2020, 08/24/2021, 06/14/2022   Influenza,inj,Quad PF,6+ Mos 07/15/2016, 10/16/2017, 08/06/2018   PFIZER(Purple Top)SARS-COV-2 Vaccination 10/18/2019, 11/15/2019   PNEUMOCOCCAL CONJUGATE-20 03/13/2023   Pneumococcal Conjugate-13 07/15/2016   Pneumococcal Polysaccharide-23 11/16/2011, 04/20/2018   Zoster Recombinant(Shingrix) 02/22/2021, 12/28/2021      Flu Vaccine status: Up to date  Pneumococcal vaccine status: Up to date  Covid-19 vaccine status: Completed vaccines  Qualifies for Shingles Vaccine? Yes   Zostavax completed Yes   Shingrix Completed?: Yes  Screening Tests Health Maintenance  Topic Date Due   OPHTHALMOLOGY EXAM  01/02/2024 (Originally 01/15/2019)   INFLUENZA VACCINE  04/17/2023   Diabetic kidney evaluation - Urine ACR  06/15/2023   FOOT EXAM  06/15/2023   HEMOGLOBIN A1C  09/12/2023   Colonoscopy  02/12/2024   Diabetic kidney evaluation - eGFR measurement  03/12/2024   Medicare Annual Wellness (AWV)  03/16/2024   Pneumonia Vaccine 82+ Years old  Completed    Hepatitis C Screening  Completed   Zoster Vaccines- Shingrix  Completed   HPV VACCINES  Aged Out   DTaP/Tdap/Td  Discontinued   COVID-19 Vaccine  Discontinued    Health Maintenance  There are no preventive care reminders to display for this patient.  Colorectal cancer screening: Type of screening: Colonoscopy. Completed 02/12/23. Repeat every 1 years   Additional Screening:  Hepatitis C Screening: Completed 05/16/17  Vision Screening: Recommended annual ophthalmology exams for early detection of glaucoma and other disorders of the eye. Is the patient up to date with their annual eye exam?  No  Who is the provider or what is the name of the office in which the patient attends annual eye  exams? [facility]  If pt is not established with a provider, would they like to be referred to a provider to establish care? No .   Dental Screening: Recommended annual dental exams for proper oral hygiene  Diabetic Foot Exam: Diabetic Foot Exam: Completed 06/14/22  Community Resource Referral / Chronic Care Management: CRR required this visit?  No   CCM required this visit?  No     Plan:     I have personally reviewed and noted the following in the patient's chart:   Medical and social history Use of alcohol, tobacco or illicit drugs  Current medications and supplements including opioid prescriptions. Patient is not currently taking opioid prescriptions. Functional ability and status Nutritional status Physical activity Advanced directives List of other physicians Hospitalizations, surgeries, and ER visits in previous 12 months Vitals Screenings to include cognitive, depression, and falls Referrals and appointments  In addition, I have reviewed and discussed with patient certain preventive protocols, quality metrics, and best practice recommendations. A written personalized care plan for preventive services as well as general preventive health recommendations were provided to  patient.     Marzella Schlein, LPN   [id number]   After Visit Summary: (MyChart) Due to this being a telephonic visit, the after visit summary with patients personalized plan was offered to patient via MyChart   Nurse Notes: none

## 2023-03-17 NOTE — Patient Instructions (Signed)
Lawrence Parker , Thank you for taking time to come for your Medicare Wellness Visit. I appreciate your ongoing commitment to your health goals. Please review the following plan we discussed and let me know if I can assist you in the future.   These are the goals we discussed:  Goals      DIET - EAT MORE FRUITS AND VEGETABLES     Exercise 150 min/wk Moderate Activity     04/23/2019, wants to start walking     Patient Stated     Starting 04/20/2018, I will continue to take medications as prescribed.      Patient Stated     Would like to lose more weight and decrease blood sugar numbers.     Patient Stated     Get in better healthy         This is a list of the screening recommended for you and due dates:  Health Maintenance  Topic Date Due   Eye exam for diabetics  01/02/2024*   Flu Shot  04/17/2023   Yearly kidney health urinalysis for diabetes  06/15/2023   Complete foot exam   06/15/2023   Hemoglobin A1C  09/12/2023   Colon Cancer Screening  02/12/2024   Yearly kidney function blood test for diabetes  03/12/2024   Medicare Annual Wellness Visit  03/16/2024   Pneumonia Vaccine  Completed   Hepatitis C Screening  Completed   Zoster (Shingles) Vaccine  Completed   HPV Vaccine  Aged Out   DTaP/Tdap/Td vaccine  Discontinued   COVID-19 Vaccine  Discontinued  *Topic was postponed. The date shown is not the original due date.    Advanced directives: Advance directive discussed with you today. Even though you declined this today please call our office should you change your mind and we can give you the proper paperwork for you to fill out.  Conditions/risks identified: get in better health   Next appointment: Follow up in one year for your annual wellness visit.   Preventive Care 34 Years and Older, Male  Preventive care refers to lifestyle choices and visits with your health care provider that can promote health and wellness. What does preventive care include? A yearly physical  exam. This is also called an annual well check. Dental exams once or twice a year. Routine eye exams. Ask your health care provider how often you should have your eyes checked. Personal lifestyle choices, including: Daily care of your teeth and gums. Regular physical activity. Eating a healthy diet. Avoiding tobacco and drug use. Limiting alcohol use. Practicing safe sex. Taking low doses of aspirin every day. Taking vitamin and mineral supplements as recommended by your health care provider. What happens during an annual well check? The services and screenings done by your health care provider during your annual well check will depend on your age, overall health, lifestyle risk factors, and family history of disease. Counseling  Your health care provider may ask you questions about your: Alcohol use. Tobacco use. Drug use. Emotional well-being. Home and relationship well-being. Sexual activity. Eating habits. History of falls. Memory and ability to understand (cognition). Work and work Astronomer. Screening  You may have the following tests or measurements: Height, weight, and BMI. Blood pressure. Lipid and cholesterol levels. These may be checked every 5 years, or more frequently if you are over 50 years old. Skin check. Lung cancer screening. You may have this screening every year starting at age 64 if you have a 30-pack-year history of smoking  and currently smoke or have quit within the past 15 years. Fecal occult blood test (FOBT) of the stool. You may have this test every year starting at age 48. Flexible sigmoidoscopy or colonoscopy. You may have a sigmoidoscopy every 5 years or a colonoscopy every 10 years starting at age 31. Prostate cancer screening. Recommendations will vary depending on your family history and other risks. Hepatitis C blood test. Hepatitis B blood test. Sexually transmitted disease (STD) testing. Diabetes screening. This is done by checking your  blood sugar (glucose) after you have not eaten for a while (fasting). You may have this done every 1-3 years. Abdominal aortic aneurysm (AAA) screening. You may need this if you are a current or former smoker. Osteoporosis. You may be screened starting at age 25 if you are at high risk. Talk with your health care provider about your test results, treatment options, and if necessary, the need for more tests. Vaccines  Your health care provider may recommend certain vaccines, such as: Influenza vaccine. This is recommended every year. Tetanus, diphtheria, and acellular pertussis (Tdap, Td) vaccine. You may need a Td booster every 10 years. Zoster vaccine. You may need this after age 50. Pneumococcal 13-valent conjugate (PCV13) vaccine. One dose is recommended after age 39. Pneumococcal polysaccharide (PPSV23) vaccine. One dose is recommended after age 22. Talk to your health care provider about which screenings and vaccines you need and how often you need them. This information is not intended to replace advice given to you by your health care provider. Make sure you discuss any questions you have with your health care provider. Document Released: 09/29/2015 Document Revised: 05/22/2016 Document Reviewed: 07/04/2015 Elsevier Interactive Patient Education  2017 [employer].  Fall Prevention in the Home Falls can cause injuries. They can happen to people of all ages. There are many things you can do to make your home safe and to help prevent falls. What can I do on the outside of my home? Regularly fix the edges of walkways and driveways and fix any cracks. Remove anything that might make you trip as you walk through a door, such as a raised step or threshold. Trim any bushes or trees on the path to your home. Use bright outdoor lighting. Clear any walking paths of anything that might make someone trip, such as rocks or tools. Regularly check to see if handrails are loose or broken. Make sure  that both sides of any steps have handrails. Any raised decks and porches should have guardrails on the edges. Have any leaves, snow, or ice cleared regularly. Use sand or salt on walking paths during winter. Clean up any spills in your garage right away. This includes oil or grease spills. What can I do in the bathroom? Use night lights. Install grab bars by the toilet and in the tub and shower. Do not use towel bars as grab bars. Use non-skid mats or decals in the tub or shower. If you need to sit down in the shower, use a plastic, non-slip stool. Keep the floor dry. Clean up any water that spills on the floor as soon as it happens. Remove soap buildup in the tub or shower regularly. Attach bath mats securely with double-sided non-slip rug tape. Do not have throw rugs and other things on the floor that can make you trip. What can I do in the bedroom? Use night lights. Make sure that you have a light by your bed that is easy to reach. Do not use any  sheets or blankets that are too big for your bed. They should not hang down onto the floor. Have a firm chair that has side arms. You can use this for support while you get dressed. Do not have throw rugs and other things on the floor that can make you trip. What can I do in the kitchen? Clean up any spills right away. Avoid walking on wet floors. Keep items that you use a lot in easy-to-reach places. If you need to reach something above you, use a strong step stool that has a grab bar. Keep electrical cords out of the way. Do not use floor polish or wax that makes floors slippery. If you must use wax, use non-skid floor wax. Do not have throw rugs and other things on the floor that can make you trip. What can I do with my stairs? Do not leave any items on the stairs. Make sure that there are handrails on both sides of the stairs and use them. Fix handrails that are broken or loose. Make sure that handrails are as long as the  stairways. Check any carpeting to make sure that it is firmly attached to the stairs. Fix any carpet that is loose or worn. Avoid having throw rugs at the top or bottom of the stairs. If you do have throw rugs, attach them to the floor with carpet tape. Make sure that you have a light switch at the top of the stairs and the bottom of the stairs. If you do not have them, ask someone to add them for you. What else can I do to help prevent falls? Wear shoes that: Do not have high heels. Have rubber bottoms. Are comfortable and fit you well. Are closed at the toe. Do not wear sandals. If you use a stepladder: Make sure that it is fully opened. Do not climb a closed stepladder. Make sure that both sides of the stepladder are locked into place. Ask someone to hold it for you, if possible. Clearly mark and make sure that you can see: Any grab bars or handrails. First and last steps. Where the edge of each step is. Use tools that help you move around (mobility aids) if they are needed. These include: Canes. Walkers. Scooters. Crutches. Turn on the lights when you go into a dark area. Replace any light bulbs as soon as they burn out. Set up your furniture so you have a clear path. Avoid moving your furniture around. If any of your floors are uneven, fix them. If there are any pets around you, be aware of where they are. Review your medicines with your doctor. Some medicines can make you feel dizzy. This can increase your chance of falling. Ask your doctor what other things that you can do to help prevent falls. This information is not intended to replace advice given to you by your health care provider. Make sure you discuss any questions you have with your health care provider. Document Released: 06/29/2009 Document Revised: 02/08/2016 Document Reviewed: 10/07/2014 Elsevier Interactive Patient Education  2017 [employer].

## 2023-03-18 ENCOUNTER — Encounter: Payer: HMO | Admitting: Primary Care

## 2023-03-18 DIAGNOSIS — H43813 Vitreous degeneration, bilateral: Secondary | ICD-10-CM | POA: Diagnosis not present

## 2023-03-18 DIAGNOSIS — Z961 Presence of intraocular lens: Secondary | ICD-10-CM | POA: Diagnosis not present

## 2023-03-18 DIAGNOSIS — H35371 Puckering of macula, right eye: Secondary | ICD-10-CM | POA: Diagnosis not present

## 2023-03-18 DIAGNOSIS — E113313 Type 2 diabetes mellitus with moderate nonproliferative diabetic retinopathy with macular edema, bilateral: Secondary | ICD-10-CM | POA: Diagnosis not present

## 2023-04-13 ENCOUNTER — Other Ambulatory Visit: Payer: Self-pay | Admitting: Primary Care

## 2023-04-13 DIAGNOSIS — E118 Type 2 diabetes mellitus with unspecified complications: Secondary | ICD-10-CM

## 2023-04-20 ENCOUNTER — Other Ambulatory Visit: Payer: Self-pay | Admitting: Primary Care

## 2023-04-20 DIAGNOSIS — E118 Type 2 diabetes mellitus with unspecified complications: Secondary | ICD-10-CM

## 2023-04-20 NOTE — Telephone Encounter (Signed)
Patient is due for diabetes follow up in late December. Please schedule, thank you!

## 2023-05-07 DIAGNOSIS — E113293 Type 2 diabetes mellitus with mild nonproliferative diabetic retinopathy without macular edema, bilateral: Secondary | ICD-10-CM | POA: Diagnosis not present

## 2023-05-07 DIAGNOSIS — H43813 Vitreous degeneration, bilateral: Secondary | ICD-10-CM | POA: Diagnosis not present

## 2023-05-07 DIAGNOSIS — H35371 Puckering of macula, right eye: Secondary | ICD-10-CM | POA: Diagnosis not present

## 2023-05-07 DIAGNOSIS — Z961 Presence of intraocular lens: Secondary | ICD-10-CM | POA: Diagnosis not present

## 2023-05-07 DIAGNOSIS — H40051 Ocular hypertension, right eye: Secondary | ICD-10-CM | POA: Diagnosis not present

## 2023-05-07 LAB — HM DIABETES EYE EXAM

## 2023-05-13 ENCOUNTER — Other Ambulatory Visit: Payer: Self-pay | Admitting: Primary Care

## 2023-05-13 DIAGNOSIS — Z794 Long term (current) use of insulin: Secondary | ICD-10-CM

## 2023-05-13 NOTE — Telephone Encounter (Signed)
Patient is due for diabetes follow up in late December, this will be required prior to any further refills.  Please schedule, thank you!

## 2023-05-14 NOTE — Telephone Encounter (Signed)
Spoke to pt, scheduled f/u for 09/12/23

## 2023-05-18 ENCOUNTER — Other Ambulatory Visit: Payer: Self-pay | Admitting: Primary Care

## 2023-05-18 DIAGNOSIS — I10 Essential (primary) hypertension: Secondary | ICD-10-CM

## 2023-06-07 ENCOUNTER — Other Ambulatory Visit: Payer: Self-pay | Admitting: Primary Care

## 2023-06-07 DIAGNOSIS — I10 Essential (primary) hypertension: Secondary | ICD-10-CM

## 2023-06-08 ENCOUNTER — Other Ambulatory Visit: Payer: Self-pay | Admitting: Primary Care

## 2023-06-08 DIAGNOSIS — E119 Type 2 diabetes mellitus without complications: Secondary | ICD-10-CM

## 2023-06-26 ENCOUNTER — Other Ambulatory Visit: Payer: Self-pay | Admitting: Primary Care

## 2023-06-26 DIAGNOSIS — E1165 Type 2 diabetes mellitus with hyperglycemia: Secondary | ICD-10-CM

## 2023-08-02 ENCOUNTER — Other Ambulatory Visit: Payer: Self-pay | Admitting: Primary Care

## 2023-08-02 DIAGNOSIS — E1165 Type 2 diabetes mellitus with hyperglycemia: Secondary | ICD-10-CM

## 2023-08-04 ENCOUNTER — Other Ambulatory Visit: Payer: Self-pay | Admitting: Primary Care

## 2023-08-04 DIAGNOSIS — E1165 Type 2 diabetes mellitus with hyperglycemia: Secondary | ICD-10-CM

## 2023-08-13 ENCOUNTER — Other Ambulatory Visit: Payer: Self-pay | Admitting: Primary Care

## 2023-08-13 DIAGNOSIS — E785 Hyperlipidemia, unspecified: Secondary | ICD-10-CM

## 2023-09-05 ENCOUNTER — Other Ambulatory Visit: Payer: Self-pay | Admitting: Primary Care

## 2023-09-05 DIAGNOSIS — E119 Type 2 diabetes mellitus without complications: Secondary | ICD-10-CM

## 2023-09-12 ENCOUNTER — Ambulatory Visit: Payer: PPO | Admitting: Primary Care

## 2023-09-20 ENCOUNTER — Other Ambulatory Visit: Payer: Self-pay | Admitting: Primary Care

## 2023-09-20 DIAGNOSIS — E118 Type 2 diabetes mellitus with unspecified complications: Secondary | ICD-10-CM

## 2023-09-21 ENCOUNTER — Other Ambulatory Visit: Payer: Self-pay | Admitting: Primary Care

## 2023-09-21 DIAGNOSIS — E118 Type 2 diabetes mellitus with unspecified complications: Secondary | ICD-10-CM

## 2023-09-21 MED ORDER — LANTUS SOLOSTAR 100 UNIT/ML ~~LOC~~ SOPN: PEN_INJECTOR | SUBCUTANEOUS | 0 refills | Status: DC

## 2023-09-22 DIAGNOSIS — H43813 Vitreous degeneration, bilateral: Secondary | ICD-10-CM | POA: Diagnosis not present

## 2023-09-22 DIAGNOSIS — Z961 Presence of intraocular lens: Secondary | ICD-10-CM | POA: Diagnosis not present

## 2023-09-22 DIAGNOSIS — H35371 Puckering of macula, right eye: Secondary | ICD-10-CM | POA: Diagnosis not present

## 2023-09-22 DIAGNOSIS — E113313 Type 2 diabetes mellitus with moderate nonproliferative diabetic retinopathy with macular edema, bilateral: Secondary | ICD-10-CM | POA: Diagnosis not present

## 2023-09-24 ENCOUNTER — Other Ambulatory Visit: Payer: Self-pay | Admitting: Primary Care

## 2023-09-24 DIAGNOSIS — E1165 Type 2 diabetes mellitus with hyperglycemia: Secondary | ICD-10-CM

## 2023-10-14 ENCOUNTER — Encounter: Payer: Self-pay | Admitting: Primary Care

## 2023-10-14 ENCOUNTER — Ambulatory Visit (INDEPENDENT_AMBULATORY_CARE_PROVIDER_SITE_OTHER): Payer: PPO | Admitting: Primary Care

## 2023-10-14 VITALS — BP 138/72 | HR 65 | Temp 97.7°F | Ht 72.0 in | Wt 191.0 lb

## 2023-10-14 DIAGNOSIS — Z794 Long term (current) use of insulin: Secondary | ICD-10-CM

## 2023-10-14 DIAGNOSIS — E118 Type 2 diabetes mellitus with unspecified complications: Secondary | ICD-10-CM

## 2023-10-14 DIAGNOSIS — E1165 Type 2 diabetes mellitus with hyperglycemia: Secondary | ICD-10-CM | POA: Diagnosis not present

## 2023-10-14 LAB — POCT GLYCOSYLATED HEMOGLOBIN (HGB A1C): Hemoglobin A1C: 6.5 % — AB (ref 4.0–5.6)

## 2023-10-14 LAB — MICROALBUMIN / CREATININE URINE RATIO
Creatinine,U: 48.4 mg/dL
Microalb Creat Ratio: 6 mg/g (ref 0.0–30.0)
Microalb, Ur: 2.9 mg/dL — ABNORMAL HIGH (ref 0.0–1.9)

## 2023-10-14 MED ORDER — ACCU-CHEK AVIVA PLUS VI STRP: ORAL_STRIP | 5 refills | Status: DC

## 2023-10-14 MED ORDER — LANTUS SOLOSTAR 100 UNIT/ML ~~LOC~~ SOPN: PEN_INJECTOR | SUBCUTANEOUS | Status: DC

## 2023-10-14 NOTE — Patient Instructions (Signed)
Reduce your insulin at night to 10 units. Continue 30 units during the day.  Continue all other diabetes medications for now.  Stop by the lab prior to leaving today. I will notify you of your results once received.   Please notify me if your blood sugars start increasing.  Please schedule a physical to meet with me in 6 months.   It was a pleasure to see you today!

## 2023-10-14 NOTE — Assessment & Plan Note (Signed)
Improved with A1c of 6.5 today!  To combat hypoglycemic episodes at night, reduce Lantus to 10 units at bedtime, continue 30 units in AM. Continue metformin ER 1000 mg twice daily, Mounjaro 7.5 mg weekly, glipizide 10 mg twice daily.  Urine microalbumin due and pending. Follow-up in 6 months.  He will update if glucose readings start to rise.

## 2023-10-14 NOTE — Progress Notes (Signed)
Subjective:    Patient ID: Lawrence Parker, male    DOB: [birth year], 78 y.o.   MRN: [id number]  HPI  Lawrence Parker is a very pleasant 78 y.o. male with a history of hypertension, type 2 diabetes, hyperlipidemia who presents today for follow up of diabetes.  Current medications include: Glipizide 10 mg BID, Lantus 30 units in AM and 25 units in PM, metformin ER 1000 mg BID, Mounjaro 7.5 mg weekly  He is checking his blood glucose continuously and is getting readings of:  AM fasting: low 100s Afternoon: 160s Bedtime: 150  He does experience hypoglycemia episodes if he doesn't eat a snack or something at bedtime. Glucose readings drop into the 60s  Last A1C: 6.8 in June 2024, 6.5 today Last Eye Exam: UTD Last Foot Exam: Due Pneumonia Vaccination: 2024 Urine Microalbumin: Due Statin: simvastatin   Dietary changes since last visit: Increased intake of sweets and carbs during the holidays.    Exercise: None.  BP Readings from Last 3 Encounters:  10/14/23 138/72  03/13/23 (!) 134/56  02/12/23 (!) 157/55       Review of Systems  Respiratory:  Negative for shortness of breath.   Cardiovascular:  Negative for chest pain.  Gastrointestinal:  Negative for abdominal pain, constipation and diarrhea.  Neurological:  Negative for dizziness and numbness.         Past Medical History:  Diagnosis Date   Ankle effusion, right 04/13/2018   Anxiety    takes Xanax prn   Arthritis    neck   Back pain    deteriorating disc   Bilateral lower extremity edema 03/20/2017   Bladder neck obstruction    Colon polyps    Depression    hx of but doesn't take any medications   Diabetes mellitus    takes Actos,Glipizide,Metformin,and Lantus   Diverticula of colon    Frontal headache 06/06/2021   History of kidney stones    also has one at present time    Hyperlipidemia    takes Zocor daily   Hypertension    takes Amlodipine daily and HCTZ daily   Neck pain    cervical  spondylosis   Peyronie disease     Social History   Socioeconomic History   Marital status: Married    Spouse name: Not on file   Number of children: Not on file   Years of education: Not on file   Highest education level: 9th grade  Occupational History   Occupation: retired  Tobacco Use   Smoking status: Former    Current packs/day: 1.00    Types: Cigarettes   Smokeless tobacco: Never   Tobacco comments:    quit smoking 71yrs ago  Vaping Use   Vaping status: Never Used  Substance and Sexual Activity   Alcohol use: No   Drug use: No   Sexual activity: Not Currently  Other Topics Concern   Not on file  Social History Narrative   Married.   2 children. 2 grandchildren.   Retired. Drove trucks for 43 years.   Enjoys traveling, spending time with family.   Social Drivers of Corporate investment banker Strain: Low Risk  (03/17/2023)   Overall Financial Resource Strain (CARDIA)    Difficulty of Paying Living Expenses: Not hard at all  Food Insecurity: No Food Insecurity (03/17/2023)   Hunger Vital Sign    Worried About Running Out of Food in the Last Year: Never true    Ran Out  of Food in the Last Year: Never true  Transportation Needs: No Transportation Needs (03/17/2023)   PRAPARE - Administrator, Civil Service (Medical): No    Lack of Transportation (Non-Medical): No  Physical Activity: Inactive (03/17/2023)   Exercise Vital Sign    Days of Exercise per Week: 0 days    Minutes of Exercise per Session: 0 min  Stress: No Stress Concern Present (03/17/2023)   [employer] of Occupational Health - Occupational Stress Questionnaire    Feeling of Stress : Not at all  Recent Concern: Stress - Stress Concern Present (03/12/2023)   [employer] of Occupational Health - Occupational Stress Questionnaire    Feeling of Stress : To some extent  Social Connections: Moderately Isolated (03/17/2023)   Social Connection and Isolation Panel [NHANES]    Frequency of  Communication with Friends and Family: More than three times a week    Frequency of Social Gatherings with Friends and Family: More than three times a week    Attends Religious Services: Never    Database administrator or Organizations: No    Attends Banker Meetings: Never    Marital Status: Married  Catering manager Violence: Not At Risk (03/17/2023)   Humiliation, Afraid, Rape, and Kick questionnaire    Fear of Current or Ex-Partner: No    Emotionally Abused: No    Physically Abused: No    Sexually Abused: No    Past Surgical History:  Procedure Laterality Date   CERVICAL SPINE SURGERY     CIRCUMCISION  20+yrs ago   COLONOSCOPY     NOSE SURGERY  20+yrs ago   SHOULDER SURGERY     right 20+yrs ago    Family History  Problem Relation Age of Onset   Cancer Mother    Diabetes Mother    Colon cancer Father    Cancer Father    Lung cancer Father    Colon cancer Sister    Cancer Sister    Colon cancer Sister    Diabetes Sister    Cancer Sister    Colon polyps Brother    Diabetes Brother    Anesthesia problems Neg Hx    Hypotension Neg Hx    Malignant hyperthermia Neg Hx    Pseudochol deficiency Neg Hx     No Known Allergies  Current Outpatient Medications on File Prior to Visit  Medication Sig Dispense Refill   Alcohol Swabs (DROPSAFE ALCOHOL PREP) 70 % PADS USE AS DIRECTED AS NEEDED 400 each 0   amLODipine (NORVASC) 10 MG tablet TAKE 1 TABLET BY MOUTH EVERY DAY FOR BLOOD PRESSURE 90 tablet 2   aspirin 325 MG tablet Take 325 mg by mouth daily.     Blood Glucose Calibration (ACCU-CHEK AVIVA) SOLN Use as instructed. 1 each 1   Blood Glucose Monitoring Suppl (ACCU-CHEK AVIVA PLUS) w/Device KIT Check blood sugar twice a day and as directed. Dx E11.9 1 kit 0   Continuous Glucose Sensor (FREESTYLE LIBRE 3 SENSOR) MISC PLACE 1 SENSOR ON THE SKIN EVERY 14 DAYS. USE TO CHECK GLUCOSE CONTINUOUSLY 6 each 1   glipiZIDE (GLUCOTROL) 10 MG tablet TAKE 1 TABLET (10 MG  TOTAL) BY MOUTH 2 (TWO) TIMES DAILY BEFORE A MEAL. FOR DIABETES. 180 tablet 0   hydrochlorothiazide (HYDRODIURIL) 25 MG tablet TAKE 1 TABLET BY MOUTH EVERY MORNING FOR BLOOD PRESSURE 90 tablet 2   Insulin Pen Needle 31G X 8 MM MISC Use as directed to injection insulin. 200 each  3   Lancets (ACCU-CHEK SOFT TOUCH) lancets Check blood sugar twice a day and as directed. Dx E11.9 300 each 2   lisinopril (ZESTRIL) 10 MG tablet TAKE 1 TABLET EVERY DAY 90 tablet 1   metFORMIN (GLUCOPHAGE-XR) 500 MG 24 hr tablet TAKE 2 TABLETS (1,000 MG TOTAL) BY MOUTH 2 (TWO) TIMES DAILY WITH A MEAL. FOR DIABETES. 360 tablet 0   simvastatin (ZOCOR) 20 MG tablet TAKE 1 TABLET BY MOUTH EVERY DAY FOR CHOLESTEROL 90 tablet 1   tirzepatide (MOUNJARO) 7.5 MG/0.5ML Pen INJECT 7.5 MG INTO THE SKIN ONCE A WEEK. FOR DIABETES. 6 mL 0   fluticasone (FLONASE) 50 MCG/ACT nasal spray USE 2 SPRAYS IN EACH NOSTRIL EVERY DAY (Patient not taking: Reported on 10/14/2023) 48 g 1   No current facility-administered medications on file prior to visit.    BP 138/72   Pulse 65   Temp 97.7 F (36.5 C) (Temporal)   Ht 6' (1.829 m)   Wt 191 lb (86.6 kg)   SpO2 98%   BMI 25.90 kg/m  Objective:   Physical Exam Cardiovascular:     Rate and Rhythm: Normal rate and regular rhythm.  Pulmonary:     Effort: Pulmonary effort is normal.     Breath sounds: Normal breath sounds.  Musculoskeletal:     Cervical back: Neck supple.  Skin:    General: Skin is warm and dry.  Neurological:     Mental Status: He is alert and oriented to person, place, and time.  Psychiatric:        Mood and Affect: Mood normal.           Assessment & Plan:  Type 2 diabetes mellitus with complication, with long-term current use of insulin (HCC) -     POCT glycosylated hemoglobin (Hb A1C) -     Microalbumin / creatinine urine ratio -     Accu-Chek Aviva Plus; TEST BLOOD SUGAR TWICE DAILY AS DIRECTED  Dispense: 200 strip; Refill: 5 -     Lantus SoloStar; Inject  30 units in the morning and 10 units in the evening for diabetes.  Type 2 diabetes mellitus with hyperglycemia, with long-term current use of insulin (HCC) Assessment & Plan: Improved with A1c of 6.5 today!  To combat hypoglycemic episodes at night, reduce Lantus to 10 units at bedtime, continue 30 units in AM. Continue metformin ER 1000 mg twice daily, Mounjaro 7.5 mg weekly, glipizide 10 mg twice daily.  Urine microalbumin due and pending. Follow-up in 6 months.  He will update if glucose readings start to rise.         Doreene Nest, NP

## 2023-10-15 DIAGNOSIS — J302 Other seasonal allergic rhinitis: Secondary | ICD-10-CM

## 2023-10-15 MED ORDER — FLUTICASONE PROPIONATE 50 MCG/ACT NA SUSP: 2.0000 | Freq: Every day | NASAL | 0 refills | Status: DC | PRN

## 2023-10-24 ENCOUNTER — Encounter: Payer: Self-pay | Admitting: Nurse Practitioner

## 2023-10-24 ENCOUNTER — Other Ambulatory Visit: Payer: PPO

## 2023-10-24 ENCOUNTER — Telehealth (INDEPENDENT_AMBULATORY_CARE_PROVIDER_SITE_OTHER): Payer: PPO | Admitting: Nurse Practitioner

## 2023-10-24 ENCOUNTER — Ambulatory Visit: Payer: Self-pay | Admitting: Primary Care

## 2023-10-24 DIAGNOSIS — U071 COVID-19: Secondary | ICD-10-CM | POA: Insufficient documentation

## 2023-10-24 DIAGNOSIS — R6883 Chills (without fever): Secondary | ICD-10-CM

## 2023-10-24 DIAGNOSIS — R059 Cough, unspecified: Secondary | ICD-10-CM

## 2023-10-24 LAB — POCT INFLUENZA A/B
Influenza A, POC: NEGATIVE
Influenza B, POC: NEGATIVE

## 2023-10-24 LAB — POC COVID19 BINAXNOW: SARS Coronavirus 2 Ag: POSITIVE — AB

## 2023-10-24 MED ORDER — GUAIFENESIN ER 600 MG PO TB12: 600.0000 mg | ORAL_TABLET | Freq: Two times a day (BID) | ORAL | 0 refills | Status: DC

## 2023-10-24 MED ORDER — BENZONATATE 100 MG PO CAPS: 100.0000 mg | ORAL_CAPSULE | Freq: Three times a day (TID) | ORAL | 0 refills | Status: DC | PRN

## 2023-10-24 NOTE — Progress Notes (Addendum)
 Virtual Visit via Video Note  I tried to connect with Lawrence Parker on 02/ 07/25 at 9:56 AM by a video enabled telemedicine application but patient's video failed  and the visited was done via telephone encounter. I verified that I am speaking with the correct person using two identifiers.  Patient Location: Home Provider Location: Office/Clinic  I discussed the limitations, risks, security, and privacy concerns of performing an evaluation and management service by video and the availability of in person appointments. I also discussed with the patient that there may be a patient responsible charge related to this service. The patient expressed understanding and agreed to proceed.  PCP: Gretta Comer POUR, NP  Chief Complaint  Patient presents with   Cough    Cough, congestion, diarrhea, body aches, sore throat since 10/22/23   HPI  Patient is today for acute visit. Has cough, congestion,  bodyaches, chills, sore throat since 2/5. Diarrhea 4-5 yesterday. No diarrhea today.   History collected through patient and his wife.  Denise fever, SOB, Chest pain   Sick contact: Positive, wife with same symptoms.   Medication: OTC cough suppressant  ROS: Per HPI  Current Outpatient Medications:    benzonatate  (TESSALON  PERLES) 100 MG capsule, Take 1 capsule (100 mg total) by mouth 3 (three) times daily as needed., Disp: 30 capsule, Rfl: 0   guaiFENesin  (MUCINEX ) 600 MG 12 hr tablet, Take 1 tablet (600 mg total) by mouth 2 (two) times daily., Disp: 20 tablet, Rfl: 0   Alcohol Swabs (DROPSAFE ALCOHOL PREP) 70 % PADS, USE AS DIRECTED AS NEEDED, Disp: 400 each, Rfl: 0   amLODipine  (NORVASC ) 10 MG tablet, TAKE 1 TABLET BY MOUTH EVERY DAY FOR BLOOD PRESSURE, Disp: 90 tablet, Rfl: 2   aspirin 325 MG tablet, Take 325 mg by mouth daily., Disp: , Rfl:    Blood Glucose Calibration (ACCU-CHEK AVIVA) SOLN, Use as instructed., Disp: 1 each, Rfl: 1   Blood Glucose Monitoring Suppl (ACCU-CHEK AVIVA  PLUS) w/Device KIT, Check blood sugar twice a day and as directed. Dx E11.9, Disp: 1 kit, Rfl: 0   Continuous Glucose Sensor (FREESTYLE LIBRE 3 SENSOR) MISC, PLACE 1 SENSOR ON THE SKIN EVERY 14 DAYS. USE TO CHECK GLUCOSE CONTINUOUSLY, Disp: 6 each, Rfl: 1   fluticasone  (FLONASE ) 50 MCG/ACT nasal spray, Place 2 sprays into both nostrils daily as needed for allergies or rhinitis., Disp: 48 g, Rfl: 0   glipiZIDE  (GLUCOTROL ) 10 MG tablet, TAKE 1 TABLET (10 MG TOTAL) BY MOUTH 2 (TWO) TIMES DAILY BEFORE A MEAL. FOR DIABETES., Disp: 180 tablet, Rfl: 0   glucose blood (ACCU-CHEK AVIVA PLUS) test strip, TEST BLOOD SUGAR TWICE DAILY AS DIRECTED, Disp: 200 strip, Rfl: 5   hydrochlorothiazide  (HYDRODIURIL ) 25 MG tablet, TAKE 1 TABLET BY MOUTH EVERY MORNING FOR BLOOD PRESSURE, Disp: 90 tablet, Rfl: 2   insulin  glargine (LANTUS  SOLOSTAR) 100 UNIT/ML Solostar Pen, Inject 30 units in the morning and 10 units in the evening for diabetes., Disp: , Rfl:    Insulin  Pen Needle 31G X 8 MM MISC, Use as directed to injection insulin ., Disp: 200 each, Rfl: 3   Lancets (ACCU-CHEK SOFT TOUCH) lancets, Check blood sugar twice a day and as directed. Dx E11.9, Disp: 300 each, Rfl: 2   lisinopril  (ZESTRIL ) 10 MG tablet, TAKE 1 TABLET EVERY DAY, Disp: 90 tablet, Rfl: 1   metFORMIN  (GLUCOPHAGE -XR) 500 MG 24 hr tablet, TAKE 2 TABLETS (1,000 MG TOTAL) BY MOUTH 2 (TWO) TIMES DAILY WITH A MEAL.  FOR DIABETES., Disp: 360 tablet, Rfl: 0   simvastatin  (ZOCOR ) 20 MG tablet, TAKE 1 TABLET BY MOUTH EVERY DAY FOR CHOLESTEROL, Disp: 90 tablet, Rfl: 1   tirzepatide  (MOUNJARO ) 7.5 MG/0.5ML Pen, INJECT 7.5 MG INTO THE SKIN ONCE A WEEK. FOR DIABETES., Disp: 6 mL, Rfl: 0  Observations/Objective: There were no vitals filed for this visit. Physical Exam Neurological:     General: No focal deficit present.     Mental Status: He is alert and oriented to person, place, and time.  Psychiatric:        Mood and Affect: Mood normal.        Behavior:  Behavior normal.        Thought Content: Thought content normal.     Assessment and Plan: COVID-19 Assessment & Plan: Called and informed pt and his wife,tested positive for COVID. Denise chest pain, SOB and fever. Have body aches, congestion and sorethroat. Discussed symptomatic treatment. Will treat with benzonatate  for cough, Guaifenesin  for congestion and tylenol  for bodyaches. Advised to increase fluid in take and rest.  Red flag discussed.  Patient was provided clear instructions to go to ER or urgent care if symptoms does not improve, red flag or new problem develops.  Patient verbalized understanding.    Cough, unspecified type -     POC COVID-19 BinaxNow -     POCT Influenza A/B  Chills -     POC COVID-19 BinaxNow -     POCT Influenza A/B  Other orders -     Benzonatate ; Take 1 capsule (100 mg total) by mouth 3 (three) times daily as needed.  Dispense: 30 capsule; Refill: 0 -     guaiFENesin  ER; Take 1 tablet (600 mg total) by mouth 2 (two) times daily.  Dispense: 20 tablet; Refill: 0    Follow Up Instructions: No follow-ups on file.   I discussed the assessment and treatment plan with the patient. The patient was provided an opportunity to ask questions, and all were answered. The patient agreed with the plan and demonstrated an understanding of the instructions.   The patient was advised to call back or seek an in-person evaluation if the symptoms worsen or if the condition fails to improve as anticipated.  The above assessment and management plan was discussed with the patient. The patient verbalized understanding of and has agreed to the management plan.   [name], NP

## 2023-10-24 NOTE — Patient Instructions (Addendum)
 You tested positive for COVID. Take benzonatate  for cough and guaifenesin  for congestion. Incresae fluid intake and rest.   Isolation Instructions: You are to isolate at home for 5 days from onset of your symptoms. If you must be around other household members who do not have symptoms, you need to make sure that both you and the family members are masking consistently with a high-quality mask.   After day 5 of isolation, if you have had no fever within 24 hours and you are feeling better, you can end isolation but need to mask for an additional 5 days.   After day 5 if you have a fever or are having significant symptoms, please isolate for full 10 days.   If you note any worsening of symptoms despite treatment, please seek an in-person evaluation ASAP. If you note any significant shortness of breath or any chest pain, please seek ER evaluation.

## 2023-10-24 NOTE — Telephone Encounter (Signed)
  Reason for Disposition  [1] Continuous (nonstop) coughing interferes with work or school AND [2] no improvement using cough treatment per Care Advice  Answer Assessment - Initial Assessment Questions 1. ONSET: When did the cough begin?      Wednesday  2. SEVERITY: How bad is the cough today?      Interfering with daily activity   3. SPUTUM: Describe the color of your sputum (none, dry cough; clear, white, yellow, green)     Clear  4. DIFFICULTY BREATHING: Are you having difficulty breathing? If Yes, ask: How bad is it? (e.g., mild, moderate, severe)    - MILD: No SOB at rest, mild SOB with walking, speaks normally in sentences, can lie down, no retractions, pulse < 100.    - MODERATE: SOB at rest, SOB with minimal exertion and prefers to sit, cannot lie down flat, speaks in phrases, mild retractions, audible wheezing, pulse 100-120.    - SEVERE: Very SOB at rest, speaks in single words, struggling to breathe, sitting hunched forward, retractions, pulse > 120      No  5. FEVER: Do you have a fever? If Yes, ask: What is your temperature, how was it measured, and when did it start?     No  7. CARDIAC HISTORY: Do you have any history of heart disease? (e.g.,  10. OTHER SYMPTOMS: Do you have any other symptoms? (e.g., runny nose, wheezing, chest pain)       Diarrhea 3-4 times a day, some wheezing, body aching  Protocols used: Cough - Acute Productive-A-AH    Chief Complaint: Cough Symptoms: Cough, congestion, body ache, diarrhea Frequency: Ongoing since Wednesday Disposition: [] ED /[] Urgent Care (no appt availability in office) / [x] Appointment(In office/virtual)/ []  [facility]/ [] Home Care/ [] Refused Recommended Disposition /[] [facility]/ []  Follow-up with PCP Additional Notes: Patient stated he has been having cough, congestion, body aches, and diarrhea since Wednesday.  Cough syrup has not been helping. No appointments available today at  PCP office. Virtual visit scheduled with different provider.

## 2023-10-24 NOTE — Assessment & Plan Note (Signed)
 Called and informed pt and his wife,tested positive for COVID. Denise chest pain, SOB and fever. Have body aches, congestion and sorethroat. Discussed symptomatic treatment. Will treat with benzonatate  for cough, Guaifenesin  for congestion and tylenol  for bodyaches. Advised to increase fluid in take and rest.  Red flag discussed.  Patient was provided clear instructions to go to ER or urgent care if symptoms does not improve, red flag or new problem develops.  Patient verbalized understanding.

## 2023-10-24 NOTE — Telephone Encounter (Signed)
Noted and appreciate Ms. Starleen Arms evaluation.

## 2023-10-25 NOTE — Addendum Note (Signed)
 Addended by: Tona Francis on: 10/25/2023 10:51 AM   Modules accepted: Level of Service

## 2023-11-06 ENCOUNTER — Other Ambulatory Visit: Payer: Self-pay | Admitting: Primary Care

## 2023-11-06 DIAGNOSIS — E1165 Type 2 diabetes mellitus with hyperglycemia: Secondary | ICD-10-CM

## 2023-11-06 DIAGNOSIS — E118 Type 2 diabetes mellitus with unspecified complications: Secondary | ICD-10-CM

## 2023-11-06 DIAGNOSIS — Z794 Long term (current) use of insulin: Secondary | ICD-10-CM

## 2023-11-06 NOTE — Telephone Encounter (Signed)
 Please call patient:  Received refill request from pharmacy for his test strips.  The test strips he was previously using do not appear to be covered by his insurance.  Does he know which brand of test strips his insurance will cover?

## 2023-11-07 DIAGNOSIS — Z794 Long term (current) use of insulin: Secondary | ICD-10-CM

## 2023-11-07 MED ORDER — GLUCOSE BLOOD VI STRP: ORAL_STRIP | 3 refills | Status: DC

## 2023-11-07 NOTE — Telephone Encounter (Signed)
 Noted.  I will send a prescription for generic glucose test trips to see if the pharmacy will adapt to what ever the insurance will cover.

## 2023-11-07 NOTE — Telephone Encounter (Signed)
 Called and spoke with patient, he does not know off hand what brand his insurance covers. He will look into this and let us know what he finds out.

## 2023-11-10 MED ORDER — BLOOD GLUCOSE TEST VI STRP: 1.0000 | ORAL_STRIP | Freq: Three times a day (TID) | 0 refills | Status: AC

## 2023-11-10 MED ORDER — BLOOD GLUCOSE MONITORING SUPPL DEVI: 1.0000 | Freq: Three times a day (TID) | 0 refills | Status: AC

## 2023-11-10 MED ORDER — LANCET DEVICE MISC: 1.0000 | Freq: Three times a day (TID) | 0 refills | Status: AC

## 2023-11-10 MED ORDER — LANCETS MISC. MISC
1.0000 | Freq: Three times a day (TID) | 0 refills | Status: AC
Start: 2023-11-10 — End: ?

## 2023-11-11 ENCOUNTER — Other Ambulatory Visit (HOSPITAL_COMMUNITY): Payer: Self-pay

## 2023-11-12 ENCOUNTER — Other Ambulatory Visit (HOSPITAL_COMMUNITY): Payer: Self-pay

## 2023-12-03 ENCOUNTER — Other Ambulatory Visit: Payer: Self-pay | Admitting: Primary Care

## 2023-12-03 DIAGNOSIS — E119 Type 2 diabetes mellitus without complications: Secondary | ICD-10-CM

## 2023-12-11 ENCOUNTER — Other Ambulatory Visit: Payer: Self-pay | Admitting: Primary Care

## 2023-12-11 DIAGNOSIS — E118 Type 2 diabetes mellitus with unspecified complications: Secondary | ICD-10-CM

## 2023-12-12 NOTE — Telephone Encounter (Signed)
 Pt walked in; pt said starting 1 - 2 wks ago pt has dull forehead H/A on and off, now pain level 5. Had dizziness (room spun around)x 1 last wk. No CP or SOB. Today pt does not feel bad slight dull H/A at forehead pain level 5;no dizziness to day. Earlier this morning wiih new BP cuff at home BP 145/75 and 160/75. Pt has been taking hydrochlorothiazide 25 mg daily and pt thought he was supposed to take lisinopril also but pt cannot find the med. Per med list lisiniopril 10 mg last fill 03/28/2020. Pt said he takes so much med he is not sure how long it has been since he took lisiniopril.no increased salt or pork intake. Now T 97.7, P 70  BP 128/76 regular cuff sitting lt arm and pulse ox 95% room air. Pt in no distress but pt is going out of town on 12/15/23 for 1 wk and wants to be seen prior to leaving town. Offered [facility] appt this afternoon but pt declined.Allayne Gitelman NP is not in office on 12/15/23 and pt scheduled with T Dugal FNP on 12/15/23 at 8:40 with UC and ED precautions. Pt voiced understanidng., sending note to T Dugal FNP and also FYI to Allayne Gitelman NP as PCP.

## 2023-12-12 NOTE — Telephone Encounter (Signed)
Noted and appreciate Tabitha's evaluation  ?

## 2023-12-15 ENCOUNTER — Encounter: Payer: Self-pay | Admitting: Family

## 2023-12-15 ENCOUNTER — Ambulatory Visit (INDEPENDENT_AMBULATORY_CARE_PROVIDER_SITE_OTHER): Admitting: Family

## 2023-12-15 VITALS — BP 126/72 | HR 68 | Temp 98.4°F | Ht 72.0 in | Wt 191.4 lb

## 2023-12-15 DIAGNOSIS — B001 Herpesviral vesicular dermatitis: Secondary | ICD-10-CM | POA: Diagnosis not present

## 2023-12-15 DIAGNOSIS — K130 Diseases of lips: Secondary | ICD-10-CM

## 2023-12-15 DIAGNOSIS — J301 Allergic rhinitis due to pollen: Secondary | ICD-10-CM | POA: Diagnosis not present

## 2023-12-15 DIAGNOSIS — I10 Essential (primary) hypertension: Secondary | ICD-10-CM | POA: Diagnosis not present

## 2023-12-15 MED ORDER — VALACYCLOVIR HCL 1 G PO TABS: ORAL_TABLET | ORAL | 0 refills | Status: DC

## 2023-12-15 NOTE — Patient Instructions (Signed)
  Continue over the counter allergy pill  Start over the counter flonase nose spray   Make sure you are not taking afrin nose spray.   A referral was placed today for dermatology  Please let us know if you have not heard back within 2 weeks about the referral.

## 2023-12-15 NOTE — Assessment & Plan Note (Signed)
 Suspect blood pressure cuff pt will go and buy a new one.  Checked cuff in office, and was abn elevated in office manual was normal limits.  Pt advised of the following:  Continue medication as prescribed. Monitor blood pressure periodically and/or when you feel symptomatic. Goal is <130/90 on average. Ensure that you have rested for 30 minutes prior to checking your blood pressure. Record your readings and bring them to your next visit if necessary.work on a low sodium diet.

## 2023-12-15 NOTE — Telephone Encounter (Signed)
 Saw patient already, thank you for speaking with the patient.  Please see note for further information.

## 2023-12-15 NOTE — Assessment & Plan Note (Signed)
 Continue otc allergy medication  Start flonase

## 2023-12-15 NOTE — Progress Notes (Addendum)
 Established Patient Office Visit  Subjective:   Patient ID: Lawrence Parker, male    DOB: 12-17-1945  Age: 78 y.o. MRN: [id number]  CC:  Chief Complaint  Patient presents with   Hypertension    HPI: Lawrence Parker is a 78 y.o. male presenting on 12/15/2023 for Hypertension  3/28 came in to the office with c/o 1-2 weeks ago of dull headache intermittent, pain on 3/28 was at a 5/10. Had had dizziness week prior but not today. Was reading 145/75 and also 160/75 with his blood pressure cuff at home. He does take hydrochlorothiazide. He states he thought he was taking lisionpril but per notes has not had refilled since 2021.   He states his headache was from allergies, and he started taking otc allergy medication and this is helping. The headache as of today is completely resolved.   In office today 126/72, he did bring his blood pressure cuff in here today and it was about 20 mmhg higher than normal.   Denies ear pain or nasal congestion.  No blurry vision or light spots. No sinus pressure in the face.  Does not feel sick just feels like he might have a little cold.   Lip lesion right lower lip, thinks it was a fever blister, however has been on there for a few months.    Wt Readings from Last 3 Encounters:  12/15/23 191 lb 6.4 oz (86.8 kg)  10/14/23 191 lb (86.6 kg)  03/17/23 183 lb (83 kg)        ROS: Negative unless specifically indicated above in HPI.   Relevant past medical history reviewed and updated as indicated.   Allergies and medications reviewed and updated.   Current Outpatient Medications:    Alcohol Swabs (DROPSAFE ALCOHOL PREP) 70 % PADS, USE AS DIRECTED AS NEEDED, Disp: 400 each, Rfl: 0   amLODipine (NORVASC) 10 MG tablet, TAKE 1 TABLET BY MOUTH EVERY DAY FOR BLOOD PRESSURE, Disp: 90 tablet, Rfl: 2   aspirin 325 MG tablet, Take 325 mg by mouth daily., Disp: , Rfl:    benzonatate (TESSALON PERLES) 100 MG capsule, Take 1 capsule (100 mg total) by mouth 3  (three) times daily as needed., Disp: 30 capsule, Rfl: 0   Blood Glucose Calibration (ACCU-CHEK AVIVA) SOLN, Use as instructed., Disp: 1 each, Rfl: 1   Blood Glucose Monitoring Suppl DEVI, 1 each by Does not apply route in the morning, at noon, and at bedtime. May substitute to any manufacturer covered by patient's insurance., Disp: 1 each, Rfl: 0   Continuous Glucose Sensor (FREESTYLE LIBRE 3 SENSOR) MISC, PLACE 1 SENSOR ON THE SKIN EVERY 14 DAYS. USE TO CHECK GLUCOSE CONTINUOUSLY, Disp: 6 each, Rfl: 1   fluticasone (FLONASE) 50 MCG/ACT nasal spray, Place 2 sprays into both nostrils daily as needed for allergies or rhinitis., Disp: 48 g, Rfl: 0   glipiZIDE (GLUCOTROL) 10 MG tablet, TAKE 1 TABLET (10 MG TOTAL) BY MOUTH 2 (TWO) TIMES DAILY BEFORE A MEAL. FOR DIABETES., Disp: 180 tablet, Rfl: 1   Glucose Blood (BLOOD GLUCOSE TEST STRIPS) STRP, 1 each by In Vitro route in the morning, at noon, and at bedtime. May substitute to any manufacturer covered by patient's insurance., Disp: 300 strip, Rfl: 0   guaiFENesin (MUCINEX) 600 MG 12 hr tablet, Take 1 tablet (600 mg total) by mouth 2 (two) times daily., Disp: 20 tablet, Rfl: 0   hydrochlorothiazide (HYDRODIURIL) 25 MG tablet, TAKE 1 TABLET BY MOUTH EVERY MORNING FOR BLOOD  PRESSURE, Disp: 90 tablet, Rfl: 2   insulin glargine (LANTUS SOLOSTAR) 100 UNIT/ML Solostar Pen, INJECT 30 UNITS IN THE MORNING AND 10 UNITS IN THE EVENING FOR DIABETES., Disp: 60 mL, Rfl: 0   Insulin Pen Needle 31G X 8 MM MISC, Use as directed to injection insulin., Disp: 200 each, Rfl: 3   Lancets (ACCU-CHEK SOFT TOUCH) lancets, Check blood sugar twice a day and as directed. Dx E11.9, Disp: 300 each, Rfl: 2   Lancets Misc. MISC, 1 each by Does not apply route in the morning, at noon, and at bedtime. May substitute to any manufacturer covered by patient's insurance., Disp: 300 each, Rfl: 0   lisinopril (ZESTRIL) 10 MG tablet, TAKE 1 TABLET EVERY DAY, Disp: 90 tablet, Rfl: 1   metFORMIN  (GLUCOPHAGE-XR) 500 MG 24 hr tablet, TAKE 2 TABLETS (1,000 MG TOTAL) BY MOUTH 2 (TWO) TIMES DAILY WITH A MEAL. FOR DIABETES., Disp: 360 tablet, Rfl: 0   simvastatin (ZOCOR) 20 MG tablet, TAKE 1 TABLET BY MOUTH EVERY DAY FOR CHOLESTEROL, Disp: 90 tablet, Rfl: 1   tirzepatide (MOUNJARO) 7.5 MG/0.5ML Pen, INJECT 7.5 MG INTO THE SKIN ONCE A WEEK. FOR DIABETES., Disp: 6 mL, Rfl: 0   valACYclovir (VALTREX) 1000 MG tablet, Take two tablets bid for one dose and prn outbreak, Disp: 20 tablet, Rfl: 0  No Known Allergies  Objective:   BP 126/72 (BP Location: Right Arm, Patient Position: Sitting)   Pulse 68   Temp 98.4 F (36.9 C) (Temporal)   Ht 6' (1.829 m)   Wt 191 lb 6.4 oz (86.8 kg)   SpO2 97%   BMI 25.96 kg/m    Physical Exam Vitals reviewed.  Constitutional:      General: He is not in acute distress.    Appearance: Normal appearance. He is not ill-appearing, toxic-appearing or diaphoretic.  HENT:     Head: Normocephalic.     Right Ear: Tympanic membrane normal.     Left Ear: Tympanic membrane normal.     Nose: Nose normal.     Mouth/Throat:     Mouth: Mucous membranes are moist.     Comments: Right lower lip with dry crusting on lower side or right lip.  Eyes:     Pupils: Pupils are equal, round, and reactive to light.  Cardiovascular:     Rate and Rhythm: Normal rate and regular rhythm.  Pulmonary:     Effort: Pulmonary effort is normal.     Breath sounds: Normal breath sounds. No wheezing.  Musculoskeletal:        General: Normal range of motion.     Cervical back: Normal range of motion.  Neurological:     General: No focal deficit present.     Mental Status: He is alert and oriented to person, place, and time. Mental status is at baseline.  Psychiatric:        Mood and Affect: Mood normal.        Behavior: Behavior normal.        Thought Content: Thought content normal.        Judgment: Judgment normal.       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   Orders: -     valACYclovir HCl; Take two tablets bid for one dose and prn outbreak  Dispense: 20 tablet; Refill: 0  Essential hypertension Assessment & Plan: Suspect blood pressure cuff pt will go and buy a new one.  Checked cuff in office, and was abn elevated in office manual was normal limits.  Pt advised of the following:  Continue medication as prescribed. Monitor blood pressure periodically and/or when you feel symptomatic. Goal is <130/90 on average. Ensure that you have rested for 30 minutes prior to checking your blood pressure. Record your readings and bring them to your next visit if necessary.work on a low sodium diet.    Seasonal allergic rhinitis due to pollen Assessment & Plan: Continue otc allergy medication  Start flonase      Follow up plan: Return for f/u PCP if no improvement in symptoms.  Mort Sawyers, FNP

## 2023-12-15 NOTE — Assessment & Plan Note (Signed)
 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

## 2023-12-20 ENCOUNTER — Other Ambulatory Visit: Payer: Self-pay | Admitting: Primary Care

## 2023-12-20 DIAGNOSIS — E1165 Type 2 diabetes mellitus with hyperglycemia: Secondary | ICD-10-CM

## 2024-01-02 ENCOUNTER — Other Ambulatory Visit: Payer: Self-pay | Admitting: Primary Care

## 2024-01-02 DIAGNOSIS — E118 Type 2 diabetes mellitus with unspecified complications: Secondary | ICD-10-CM

## 2024-01-09 ENCOUNTER — Other Ambulatory Visit: Payer: Self-pay | Admitting: Primary Care

## 2024-01-09 ENCOUNTER — Encounter: Payer: Self-pay | Admitting: Internal Medicine

## 2024-01-09 DIAGNOSIS — Z794 Long term (current) use of insulin: Secondary | ICD-10-CM

## 2024-01-12 ENCOUNTER — Other Ambulatory Visit: Payer: Self-pay | Admitting: Primary Care

## 2024-01-12 ENCOUNTER — Other Ambulatory Visit (HOSPITAL_COMMUNITY): Payer: Self-pay

## 2024-01-12 ENCOUNTER — Telehealth: Payer: Self-pay | Admitting: Pharmacy Technician

## 2024-01-12 DIAGNOSIS — J302 Other seasonal allergic rhinitis: Secondary | ICD-10-CM

## 2024-01-12 NOTE — Telephone Encounter (Signed)
 Pharmacy Patient Advocate Encounter   Received notification from CoverMyMeds that prior authorization for Mounjaro  7.5MG /0.5ML auto-injectors is required/requested.   Insurance verification completed.   The patient is insured through [facility] ADVANTAGE/RX ADVANCE .   Per test claim: PA required; PA submitted to above mentioned insurance via CoverMyMeds Key/confirmation #/EOC [id number] Status is pending

## 2024-01-12 NOTE — Telephone Encounter (Signed)
 Pharmacy Patient Advocate Encounter  Received notification from [facility] ADVANTAGE/RX ADVANCE that Prior Authorization for Mounjaro  7.5MG /0.5ML auto-injectors has been APPROVED from 01/12/2024 to 01/11/2025. Ran test claim, Copay is $0.00. This test claim was processed through [facility]- copay amounts may vary at other pharmacies due to pharmacy/plan contracts, or as the patient moves through the different stages of their insurance plan.   PA #/Case ID/Reference #: Key: [id number]

## 2024-01-13 ENCOUNTER — Other Ambulatory Visit (HOSPITAL_COMMUNITY): Payer: Self-pay

## 2024-01-15 ENCOUNTER — Other Ambulatory Visit: Payer: Self-pay | Admitting: Primary Care

## 2024-01-15 DIAGNOSIS — Z794 Long term (current) use of insulin: Secondary | ICD-10-CM

## 2024-01-15 NOTE — Telephone Encounter (Signed)
 Copied from CRM [id number]. Topic: Clinical - Medication Refill >> Jan 15, 2024  1:44 PM Turkey A wrote: Most Recent Primary Care Visit:  Provider: Felicita Horns  Department: [facility]  Visit Type: OFFICE VISIT  Date: 12/15/2023  Medication: tirzepatide  (MOUNJARO ) 7.5 MG/0.5ML Pen  Has the patient contacted their pharmacy? Yes (Agent: If no, request that the patient contact the pharmacy for the refill. If patient does not wish to contact the pharmacy document the reason why and proceed with request.) (Agent: If yes, when and what did the pharmacy advise?)  Is this the correct pharmacy for this prescription? Yes If no, delete pharmacy and type the correct one.  This is the patient's preferred pharmacy:  [facility] [phone],  - [address] [place] [address] Phone: [phone] Fax: [phone]   Has the prescription been filled recently? No  Is the patient out of the medication? No  Has the patient been seen for an appointment in the last year OR does the patient have an upcoming appointment? Yes  Can we respond through MyChart? Yes  Agent: Please be advised that Rx refills may take up to 3 business days. We ask that you follow-up with your pharmacy.

## 2024-01-15 NOTE — Telephone Encounter (Signed)
 Copied from CRM [id number]. Topic: Clinical - Medication Refill >> Jan 15, 2024  1:44 PM Turkey A wrote: Most Recent Primary Care Visit:  Provider: Felicita Horns  Department: [facility]  Visit Type: OFFICE VISIT  Date: 12/15/2023  Medication: tirzepatide  (MOUNJARO ) 7.5 MG/0.5ML Pen  Has the patient contacted their pharmacy? Yes (Agent: If no, request that the patient contact the pharmacy for the refill. If patient does not wish to contact the pharmacy document the reason why and proceed with request.) (Agent: If yes, when and what did the pharmacy advise?)  Is this the correct pharmacy for this prescription? Yes If no, delete pharmacy and type the correct one.  This is the patient's preferred pharmacy:  [facility] [id number] [place] - [address] [place] [address] Phone: [phone] Fax: [phone]   Has the prescription been filled recently?   Is the patient out of the medication? No  Has the patient been seen for an appointment in the last year OR does the patient have an upcoming appointment? Yes  Can we respond through MyChart? Yes  Agent: Please be advised that Rx refills may take up to 3 business days. We ask that you follow-up with your pharmacy.

## 2024-01-16 NOTE — Telephone Encounter (Signed)
 Confirmed with pharm that prescription is there.

## 2024-02-03 ENCOUNTER — Encounter

## 2024-02-05 ENCOUNTER — Ambulatory Visit (INDEPENDENT_AMBULATORY_CARE_PROVIDER_SITE_OTHER): Admitting: Primary Care

## 2024-02-05 ENCOUNTER — Encounter: Payer: Self-pay | Admitting: Primary Care

## 2024-02-05 VITALS — BP 124/66 | HR 62 | Temp 97.8°F | Ht 72.0 in | Wt 190.0 lb

## 2024-02-05 DIAGNOSIS — D225 Melanocytic nevi of trunk: Secondary | ICD-10-CM | POA: Diagnosis not present

## 2024-02-05 DIAGNOSIS — K130 Diseases of lips: Secondary | ICD-10-CM

## 2024-02-05 DIAGNOSIS — D485 Neoplasm of uncertain behavior of skin: Secondary | ICD-10-CM | POA: Diagnosis not present

## 2024-02-05 DIAGNOSIS — L821 Other seborrheic keratosis: Secondary | ICD-10-CM | POA: Diagnosis not present

## 2024-02-05 DIAGNOSIS — D235 Other benign neoplasm of skin of trunk: Secondary | ICD-10-CM | POA: Diagnosis not present

## 2024-02-05 DIAGNOSIS — D0461 Carcinoma in situ of skin of right upper limb, including shoulder: Secondary | ICD-10-CM | POA: Diagnosis not present

## 2024-02-05 DIAGNOSIS — C4402 Squamous cell carcinoma of skin of lip: Secondary | ICD-10-CM | POA: Diagnosis not present

## 2024-02-05 DIAGNOSIS — D2262 Melanocytic nevi of left upper limb, including shoulder: Secondary | ICD-10-CM | POA: Diagnosis not present

## 2024-02-05 DIAGNOSIS — L570 Actinic keratosis: Secondary | ICD-10-CM | POA: Diagnosis not present

## 2024-02-05 DIAGNOSIS — D2261 Melanocytic nevi of right upper limb, including shoulder: Secondary | ICD-10-CM | POA: Diagnosis not present

## 2024-02-05 DIAGNOSIS — L905 Scar conditions and fibrosis of skin: Secondary | ICD-10-CM | POA: Diagnosis not present

## 2024-02-05 NOTE — Assessment & Plan Note (Signed)
 Suspicious for carcinoma.  See pictures in chart.   Urgent referral placed for dermatology evaluation.

## 2024-02-05 NOTE — Patient Instructions (Signed)
 You will either be contacted via phone regarding your referral to dermatology, or you may receive a letter on your MyChart portal from our referral team with instructions for scheduling an appointment. Please let us  know if you have not been contacted by anyone within two to three business days.  It was a pleasure to see you today!

## 2024-02-05 NOTE — Progress Notes (Signed)
 Subjective:    Patient ID: Lawrence Parker, male    DOB: May 25, 1946, 78 y.o.   MRN: [id number]  Mouth Lesions     Lawrence Parker is a very pleasant 78 y.o. male with a history of type 2 diabetes, hypertension, PAD, lip lesion who presents today to discuss lip lesion.  Evaluated by Melba Spittle NP on 12/15/23 for a several month history of lip lesion to right lower lip. He was treated with Valtrex  2 g BID x 1 day and referred to dermatology.   Today he continues to notice the lip lesion to the bottom right lip which has grown in size. The Valtrex  was ineffective. He has an appointment scheduled with dermatology for August 2025, but he's concerned that his lip lesion will be much worse by then. He's applied neosporin and numerous other OTC products without improvement.   He denies oral sores. He does have numerous moles/skin lesions to his body that he has questions about. He is a prior smoker, quit 20 years ago. He began doing nicotine pills about 2-3 months ago. Denies chewing tobacco history.   Wt Readings from Last 3 Encounters:  02/05/24 190 lb (86.2 kg)  12/15/23 191 lb 6.4 oz (86.8 kg)  10/14/23 191 lb (86.6 kg)      Review of Systems  Constitutional:  Negative for unexpected weight change.  HENT:         Lip lesion  Skin:  Positive for color change and wound.         Past Medical History:  Diagnosis Date   Ankle effusion, right 04/13/2018   Anxiety    takes Xanax  prn   Arthritis    neck   Back pain    deteriorating disc   Bilateral lower extremity edema 03/20/2017   Bladder neck obstruction    Colon polyps    Depression    hx of but doesn't take any medications   Diabetes mellitus    takes Actos ,Glipizide ,Metformin ,and Lantus    Diverticula of colon    Frontal headache 06/06/2021   History of kidney stones    also has one at present time    Hyperlipidemia    takes Zocor  daily   Hypertension    takes Amlodipine  daily and HCTZ daily   Neck pain     cervical spondylosis   Peyronie disease     Social History   Socioeconomic History   Marital status: Married    Spouse name: Not on file   Number of children: Not on file   Years of education: Not on file   Highest education level: 9th grade  Occupational History   Occupation: retired  Tobacco Use   Smoking status: Former    Current packs/day: 1.00    Types: Cigarettes   Smokeless tobacco: Never   Tobacco comments:    quit smoking 28yrs ago  Vaping Use   Vaping status: Never Used  Substance and Sexual Activity   Alcohol use: No   Drug use: No   Sexual activity: Not Currently  Other Topics Concern   Not on file  Social History Narrative   Married.   2 children. 2 grandchildren.   Retired. Drove trucks for 43 years.   Enjoys traveling, spending time with family.   Social Drivers of Corporate investment banker Strain: Low Risk  (03/17/2023)   Overall Financial Resource Strain (CARDIA)    Difficulty of Paying Living Expenses: Not hard at all  Food Insecurity: No Food Insecurity (03/17/2023)  Hunger Vital Sign    Worried About Running Out of Food in the Last Year: Never true    Ran Out of Food in the Last Year: Never true  Transportation Needs: No Transportation Needs (03/17/2023)   PRAPARE - Administrator, Civil Service (Medical): No    Lack of Transportation (Non-Medical): No  Physical Activity: Inactive (03/17/2023)   Exercise Vital Sign    Days of Exercise per Week: 0 days    Minutes of Exercise per Session: 0 min  Stress: No Stress Concern Present (03/17/2023)   [employer] of Occupational Health - Occupational Stress Questionnaire    Feeling of Stress : Not at all  Recent Concern: Stress - Stress Concern Present (03/12/2023)   [employer] of Occupational Health - Occupational Stress Questionnaire    Feeling of Stress : To some extent  Social Connections: Moderately Isolated (03/17/2023)   Social Connection and Isolation Panel [NHANES]     Frequency of Communication with Friends and Family: More than three times a week    Frequency of Social Gatherings with Friends and Family: More than three times a week    Attends Religious Services: Never    Database administrator or Organizations: No    Attends Banker Meetings: Never    Marital Status: Married  Catering manager Violence: Not At Risk (03/17/2023)   Humiliation, Afraid, Rape, and Kick questionnaire    Fear of Current or Ex-Partner: No    Emotionally Abused: No    Physically Abused: No    Sexually Abused: No    Past Surgical History:  Procedure Laterality Date   CERVICAL SPINE SURGERY     CIRCUMCISION  20+yrs ago   COLONOSCOPY     NOSE SURGERY  20+yrs ago   SHOULDER SURGERY     right 20+yrs ago    Family History  Problem Relation Age of Onset   Cancer Mother    Diabetes Mother    Colon cancer Father    Cancer Father    Lung cancer Father    Colon cancer Sister    Cancer Sister    Colon cancer Sister    Diabetes Sister    Cancer Sister    Colon polyps Brother    Diabetes Brother    Anesthesia problems Neg Hx    Hypotension Neg Hx    Malignant hyperthermia Neg Hx    Pseudochol deficiency Neg Hx     No Known Allergies  Current Outpatient Medications on File Prior to Visit  Medication Sig Dispense Refill   Alcohol Swabs (DROPSAFE ALCOHOL PREP) 70 % PADS USE AS DIRECTED AS NEEDED 400 each 0   amLODipine  (NORVASC ) 10 MG tablet TAKE 1 TABLET BY MOUTH EVERY DAY FOR BLOOD PRESSURE 90 tablet 2   aspirin 325 MG tablet Take 325 mg by mouth daily.     benzonatate  (TESSALON  PERLES) 100 MG capsule Take 1 capsule (100 mg total) by mouth 3 (three) times daily as needed. 30 capsule 0   Blood Glucose Calibration (ACCU-CHEK AVIVA) SOLN Use as instructed. 1 each 1   Blood Glucose Monitoring Suppl DEVI 1 each by Does not apply route in the morning, at noon, and at bedtime. May substitute to any manufacturer covered by patient's insurance. 1 each 0    Continuous Glucose Sensor (FREESTYLE LIBRE 3 SENSOR) MISC PLACE 1 SENSOR ON THE SKIN EVERY 14 DAYS. USE TO CHECK GLUCOSE CONTINUOUSLY 6 each 1   fluticasone  (FLONASE ) 50 MCG/ACT nasal spray  PLACE 2 SPRAYS INTO BOTH NOSTRILS DAILY AS NEEDED FOR ALLERGIES OR RHINITIS. 48 mL 0   glipiZIDE  (GLUCOTROL ) 10 MG tablet TAKE 1 TABLET (10 MG TOTAL) BY MOUTH 2 (TWO) TIMES DAILY BEFORE A MEAL. FOR DIABETES. 180 tablet 1   Glucose Blood (BLOOD GLUCOSE TEST STRIPS) STRP 1 each by In Vitro route in the morning, at noon, and at bedtime. May substitute to any manufacturer covered by patient's insurance. 300 strip 0   guaiFENesin  (MUCINEX ) 600 MG 12 hr tablet Take 1 tablet (600 mg total) by mouth 2 (two) times daily. 20 tablet 0   hydrochlorothiazide  (HYDRODIURIL ) 25 MG tablet TAKE 1 TABLET BY MOUTH EVERY MORNING FOR BLOOD PRESSURE 90 tablet 2   insulin  glargine (LANTUS  SOLOSTAR) 100 UNIT/ML Solostar Pen INJECT 30 UNITS IN THE MORNING AND 10 UNITS IN THE EVENING FOR DIABETES. 30 mL 0   Insulin  Pen Needle 31G X 8 MM MISC Use as directed to injection insulin . 200 each 3   Lancets (ACCU-CHEK SOFT TOUCH) lancets Check blood sugar twice a day and as directed. Dx E11.9 300 each 2   Lancets Misc. MISC 1 each by Does not apply route in the morning, at noon, and at bedtime. May substitute to any manufacturer covered by patient's insurance. 300 each 0   lisinopril  (ZESTRIL ) 10 MG tablet TAKE 1 TABLET EVERY DAY 90 tablet 1   metFORMIN  (GLUCOPHAGE -XR) 500 MG 24 hr tablet TAKE 2 TABLETS BY MOUTH TWICE A DAY WITH A MEAL FOR DIABETES 360 tablet 1   simvastatin  (ZOCOR ) 20 MG tablet TAKE 1 TABLET BY MOUTH EVERY DAY FOR CHOLESTEROL 90 tablet 1   tirzepatide  (MOUNJARO ) 7.5 MG/0.5ML Pen INJECT 7.5 MG INTO THE SKIN ONCE A WEEK. FOR DIABETES. 6 mL 0   No current facility-administered medications on file prior to visit.    BP 124/66   Pulse 62   Temp 97.8 F (36.6 C) (Temporal)   Ht 6' (1.829 m)   Wt 190 lb (86.2 kg)   SpO2 97%    BMI 25.77 kg/m  Objective:   Physical Exam HENT:     Mouth/Throat:     Lips: Lesions present.     Mouth: Mucous membranes are moist. No oral lesions.     Tongue: No lesions.     Palate: No mass.      Comments: 2X1 cm cratered lesion to right lower lip. See picture.  Cardiovascular:     Rate and Rhythm: Normal rate.  Pulmonary:     Effort: Pulmonary effort is normal.  Neurological:     Mental Status: He is alert.             Assessment & Plan:  Lip lesion Assessment & Plan: Suspicious for carcinoma.  See pictures in chart.   Urgent referral placed for dermatology evaluation.   Orders: -     Ambulatory referral to Dermatology        Gabriel John, NP

## 2024-02-07 ENCOUNTER — Other Ambulatory Visit: Payer: Self-pay | Admitting: Primary Care

## 2024-02-07 DIAGNOSIS — I10 Essential (primary) hypertension: Secondary | ICD-10-CM

## 2024-02-07 DIAGNOSIS — E785 Hyperlipidemia, unspecified: Secondary | ICD-10-CM

## 2024-02-17 ENCOUNTER — Encounter: Admitting: Internal Medicine

## 2024-02-23 ENCOUNTER — Other Ambulatory Visit: Payer: Self-pay | Admitting: Primary Care

## 2024-02-23 DIAGNOSIS — E1165 Type 2 diabetes mellitus with hyperglycemia: Secondary | ICD-10-CM

## 2024-03-15 ENCOUNTER — Ambulatory Visit (INDEPENDENT_AMBULATORY_CARE_PROVIDER_SITE_OTHER): Payer: PPO

## 2024-03-15 VITALS — BP 124/66 | Ht 72.0 in | Wt 180.0 lb

## 2024-03-15 DIAGNOSIS — E119 Type 2 diabetes mellitus without complications: Secondary | ICD-10-CM | POA: Diagnosis not present

## 2024-03-15 DIAGNOSIS — Z5320 Procedure and treatment not carried out because of patient's decision for unspecified reasons: Secondary | ICD-10-CM | POA: Diagnosis not present

## 2024-03-15 DIAGNOSIS — Z0000 Encounter for general adult medical examination without abnormal findings: Secondary | ICD-10-CM | POA: Diagnosis not present

## 2024-03-15 DIAGNOSIS — Z794 Long term (current) use of insulin: Secondary | ICD-10-CM

## 2024-03-15 DIAGNOSIS — E1165 Type 2 diabetes mellitus with hyperglycemia: Secondary | ICD-10-CM | POA: Diagnosis not present

## 2024-03-15 NOTE — Progress Notes (Signed)
 Because this visit was a virtual/telehealth visit,  certain criteria was not obtained, such a blood pressure, CBG if applicable, and timed get up and go. Any medications not marked as taking were not mentioned during the medication reconciliation part of the visit. Any vitals not documented were not able to be obtained due to this being a telehealth visit or patient was unable to self-report a recent blood pressure reading due to a lack of equipment at home via telehealth. Vitals that have been documented are verbally provided by the patient.  This visit was performed by a medical professional under my direct supervision. I was immediately available for consultation/collaboration. I have reviewed and agree with the Annual Wellness Visit documentation.  Subjective:   Lawrence Parker is a 78 y.o. who presents for a Medicare Wellness preventive visit.  As a reminder, Annual Wellness Visits don't include a physical exam, and some assessments may be limited, especially if this visit is performed virtually. We may recommend an in-person follow-up visit with your provider if needed.  Visit Complete: Virtual I connected with  Lawrence Parker on 03/15/24 by a audio enabled telemedicine application and verified that I am speaking with the correct person using two identifiers.  Patient Location: Home  Provider Location: Home Office  I discussed the limitations of evaluation and management by telemedicine. The patient expressed understanding and agreed to proceed.  Vital Signs: Because this visit was a virtual/telehealth visit, some criteria may be missing or patient reported. Any vitals not documented were not able to be obtained and vitals that have been documented are patient reported.  VideoDeclined- This patient declined Librarian, academic. Therefore the visit was completed with audio only.  Persons Participating in Visit: Patient.  AWV Questionnaire: No: Patient  Medicare AWV questionnaire was not completed prior to this visit.  Cardiac Risk Factors include: advanced age (>31men, >70 women);male gender;hypertension;diabetes mellitus     Objective:    Today's Vitals   03/15/24 1103  BP: 124/66  Weight: 180 lb (81.6 kg)  Height: 6' (1.829 m)   Body mass index is 24.41 kg/m.     03/15/2024   11:07 AM 03/17/2023    2:14 PM 07/16/2022   10:55 AM 07/10/2021   12:09 PM 02/15/2020    9:01 AM 04/23/2019    9:20 AM 04/20/2018   12:32 PM  Advanced Directives  Does Patient Have a Medical Advance Directive? No No No No No No Yes   Type of Tax inspector;Living will  Copy of Healthcare Power of Attorney in Chart?       No - copy requested   Would patient like information on creating a medical advance directive? No - Patient declined No - Patient declined No - Patient declined Yes (MAU/Ambulatory/Procedural Areas - Information given) No - Patient declined       Data saved with a previous flowsheet row definition    Current Medications (verified) Outpatient Encounter Medications as of 03/15/2024  Medication Sig   Alcohol Swabs (DROPSAFE ALCOHOL PREP) 70 % PADS USE AS DIRECTED AS NEEDED   amLODipine  (NORVASC ) 10 MG tablet TAKE 1 TABLET BY MOUTH EVERY DAY FOR BLOOD PRESSURE   aspirin 325 MG tablet Take 325 mg by mouth daily.   benzonatate  (TESSALON  PERLES) 100 MG capsule Take 1 capsule (100 mg total) by mouth 3 (three) times daily as needed.   Blood Glucose Calibration (ACCU-CHEK AVIVA) SOLN Use as instructed.  Blood Glucose Monitoring Suppl DEVI 1 each by Does not apply route in the morning, at noon, and at bedtime. May substitute to any manufacturer covered by patient's insurance.   Continuous Glucose Sensor (FREESTYLE LIBRE 3 SENSOR) MISC PLACE 1 SENSOR ON THE SKIN EVERY 14 DAYS. USE TO CHECK GLUCOSE CONTINUOUSLY   fluticasone  (FLONASE ) 50 MCG/ACT nasal spray PLACE 2 SPRAYS INTO BOTH NOSTRILS DAILY AS NEEDED FOR  ALLERGIES OR RHINITIS.   glipiZIDE  (GLUCOTROL ) 10 MG tablet TAKE 1 TABLET (10 MG TOTAL) BY MOUTH 2 (TWO) TIMES DAILY BEFORE A MEAL. FOR DIABETES.   Glucose Blood (BLOOD GLUCOSE TEST STRIPS) STRP 1 each by In Vitro route in the morning, at noon, and at bedtime. May substitute to any manufacturer covered by patient's insurance.   guaiFENesin  (MUCINEX ) 600 MG 12 hr tablet Take 1 tablet (600 mg total) by mouth 2 (two) times daily.   hydrochlorothiazide  (HYDRODIURIL ) 25 MG tablet TAKE 1 TABLET BY MOUTH EVERY MORNING FOR BLOOD PRESSURE   insulin  glargine (LANTUS  SOLOSTAR) 100 UNIT/ML Solostar Pen INJECT 30 UNITS IN THE MORNING AND 10 UNITS IN THE EVENING FOR DIABETES.   Insulin  Pen Needle 31G X 8 MM MISC Use as directed to injection insulin .   Lancets (ACCU-CHEK SOFT TOUCH) lancets Check blood sugar twice a day and as directed. Dx E11.9   Lancets Misc. MISC 1 each by Does not apply route in the morning, at noon, and at bedtime. May substitute to any manufacturer covered by patient's insurance.   lisinopril  (ZESTRIL ) 10 MG tablet TAKE 1 TABLET EVERY DAY   metFORMIN  (GLUCOPHAGE -XR) 500 MG 24 hr tablet TAKE 2 TABLETS BY MOUTH TWICE A DAY WITH A MEAL FOR DIABETES   simvastatin  (ZOCOR ) 20 MG tablet TAKE 1 TABLET BY MOUTH EVERY DAY FOR CHOLESTEROL   tirzepatide  (MOUNJARO ) 7.5 MG/0.5ML Pen INJECT 7.5 MG INTO THE SKIN ONCE A WEEK. FOR DIABETES.   No facility-administered encounter medications on file as of 03/15/2024.    Allergies (verified) Patient has no known allergies.   History: Past Medical History:  Diagnosis Date   Ankle effusion, right 04/13/2018   Anxiety    takes Xanax  prn   Arthritis    neck   Back pain    deteriorating disc   Bilateral lower extremity edema 03/20/2017   Bladder neck obstruction    Colon polyps    Depression    hx of but doesn't take any medications   Diabetes mellitus    takes Actos ,Glipizide ,Metformin ,and Lantus    Diverticula of colon    Frontal headache  06/06/2021   History of kidney stones    also has one at present time    Hyperlipidemia    takes Zocor  daily   Hypertension    takes Amlodipine  daily and HCTZ daily   Neck pain    cervical spondylosis   Peyronie disease    Past Surgical History:  Procedure Laterality Date   CERVICAL SPINE SURGERY     CIRCUMCISION  20+yrs ago   COLONOSCOPY     NOSE SURGERY  20+yrs ago   SHOULDER SURGERY     right 20+yrs ago   Family History  Problem Relation Age of Onset   Cancer Mother    Diabetes Mother    Colon cancer Father    Cancer Father    Lung cancer Father    Colon cancer Sister    Cancer Sister    Colon cancer Sister    Diabetes Sister    Cancer Sister  Colon polyps Brother    Diabetes Brother    Anesthesia problems Neg Hx    Hypotension Neg Hx    Malignant hyperthermia Neg Hx    Pseudochol deficiency Neg Hx    Social History   Socioeconomic History   Marital status: Married    Spouse name: Not on file   Number of children: Not on file   Years of education: Not on file   Highest education level: 9th grade  Occupational History   Occupation: retired  Tobacco Use   Smoking status: Former    Current packs/day: 1.00    Types: Cigarettes   Smokeless tobacco: Never   Tobacco comments:    quit smoking 79yrs ago  Vaping Use   Vaping status: Never Used  Substance and Sexual Activity   Alcohol use: No   Drug use: No   Sexual activity: Not Currently  Other Topics Concern   Not on file  Social History Narrative   Married.   2 children. 2 grandchildren.   Retired. Drove trucks for 43 years.   Enjoys traveling, spending time with family.   Social Drivers of Corporate investment banker Strain: Low Risk  (03/15/2024)   Overall Financial Resource Strain (CARDIA)    Difficulty of Paying Living Expenses: Not hard at all  Food Insecurity: No Food Insecurity (03/15/2024)   Hunger Vital Sign    Worried About Running Out of Food in the Last Year: Never true    Ran Out  of Food in the Last Year: Never true  Transportation Needs: No Transportation Needs (03/15/2024)   PRAPARE - Administrator, Civil Service (Medical): No    Lack of Transportation (Non-Medical): No  Physical Activity: Inactive (03/15/2024)   Exercise Vital Sign    Days of Exercise per Week: 0 days    Minutes of Exercise per Session: 0 min  Stress: No Stress Concern Present (03/15/2024)   [employer] of Occupational Health - Occupational Stress Questionnaire    Feeling of Stress: Not at all  Social Connections: Moderately Isolated (03/15/2024)   Social Connection and Isolation Panel    Frequency of Communication with Friends and Family: More than three times a week    Frequency of Social Gatherings with Friends and Family: More than three times a week    Attends Religious Services: Never    Database administrator or Organizations: No    Attends Engineer, structural: Never    Marital Status: Married    Tobacco Counseling Counseling given: Not Answered Tobacco comments: quit smoking 13yrs ago    Clinical Intake:  Pre-visit preparation completed: Yes  Pain : No/denies pain     BMI - recorded: 24.41 Nutritional Status: BMI of 19-24  Normal Nutritional Risks: None Diabetes: No  Lab Results  Component Value Date   HGBA1C 6.5 (A) 10/14/2023   HGBA1C 6.8 (H) 03/13/2023   HGBA1C 7.4 (A) 12/17/2022     How often do you need to have someone help you when you read instructions, pamphlets, or other written materials from your doctor or pharmacy?: 1 - Never  Interpreter Needed?: No  Information entered by :: [employer]   Activities of Daily Living     03/15/2024   11:06 AM 03/17/2023    2:15 PM  In your present state of health, do you have any difficulty performing the following activities:  Hearing? 0 0  Vision? 0 0  Difficulty concentrating or making decisions? 0 0  Walking  or climbing stairs? 0 0  Dressing or bathing? 0 0  Doing  errands, shopping? 0 0  Preparing Food and eating ? N N  Using the Toilet? N N  In the past six months, have you accidently leaked urine? N N  Do you have problems with loss of bowel control? N N  Managing your Medications? N N  Managing your Finances? N N  Housekeeping or managing your Housekeeping? N N    Patient Care Team: Gretta Comer POUR, NP as PCP - General (Internal Medicine)  I have updated your Care Teams any recent Medical Services you may have received from other providers in the past year.     Assessment:   This is a routine wellness examination for Lawrence Parker.  Hearing/Vision screen Hearing Screening - Comments:: No difficulties Vision Screening - Comments:: Patient wears glasses   Goals Addressed             This Visit's Progress    Patient Stated   On track    Get in better healthy        Depression Screen     03/15/2024   11:08 AM 02/05/2024    7:31 AM 10/24/2023   10:14 AM 10/14/2023   10:50 AM 03/17/2023    2:13 PM 03/13/2023    7:55 AM 07/16/2022   10:54 AM  PHQ 2/9 Scores  PHQ - 2 Score 2 0 0 0 0 0 0  PHQ- 9 Score 2  0    0    Fall Risk     03/15/2024   11:23 AM 02/05/2024    7:31 AM 10/24/2023   10:14 AM 10/14/2023   10:50 AM 03/17/2023    2:15 PM  Fall Risk   Falls in the past year? 0 0 0 0 0  Number falls in past yr: 0 0 0 0 0  Injury with Fall? 0 0  0 0  Risk for fall due to : No Fall Risks No Fall Risks No Fall Risks No Fall Risks No Fall Risks  Follow up Falls evaluation completed Falls evaluation completed Falls evaluation completed Falls evaluation completed Falls prevention discussed    MEDICARE RISK AT HOME:  Medicare Risk at Home Any stairs in or around the home?: No If so, are there any without handrails?: No Home free of loose throw rugs in walkways, pet beds, electrical cords, etc?: Yes Adequate lighting in your home to reduce risk of falls?: Yes Life alert?: No Use of a cane, walker or w/c?: No Grab bars in the bathroom?:  Yes Shower chair or bench in shower?: No Elevated toilet seat or a handicapped toilet?: No  TIMED UP AND GO:  Was the test performed?  No  Cognitive Function: 6CIT completed    04/23/2019    9:26 AM 04/20/2018    3:47 PM  MMSE - Mini Mental State Exam  Orientation to time 5 5  Orientation to Place 5 5  Registration 3 3  Attention/ Calculation 0 0  Attention/Calculation-comments could not spell word   Recall 3 3  Language- name 2 objects 0 0  Language- repeat 1 1  Language- follow 3 step command 0 3  Language- read & follow direction 0 0  Write a sentence 0 0  Copy design 0 0  Total score 17 20        03/15/2024   11:05 AM 03/17/2023    2:16 PM 07/16/2022   10:56 AM  6CIT Screen  What Year?  0 points 0 points 0 points  What month? 0 points 0 points 0 points  What time? 0 points 0 points 0 points  Count back from 20 0 points 0 points 0 points  Months in reverse 0 points 2 points 0 points  Repeat phrase 0 points 2 points 0 points  Total Score 0 points 4 points 0 points    Immunizations Immunization History  Administered Date(s) Administered   Fluad Quad(high Dose 65+) 08/02/2019, 11/17/2020, 08/24/2021, 06/14/2022   Influenza, High Dose Seasonal PF 07/21/2023   Influenza,inj,Quad PF,6+ Mos 07/15/2016, 10/16/2017, 08/06/2018   PFIZER(Purple Top)SARS-COV-2 Vaccination 10/18/2019, 11/15/2019   PNEUMOCOCCAL CONJUGATE-20 03/13/2023   Pneumococcal Conjugate-13 07/15/2016   Pneumococcal Polysaccharide-23 11/16/2011, 04/20/2018   Zoster Recombinant(Shingrix) 02/22/2021, 12/28/2021    Screening Tests Health Maintenance  Topic Date Due   Diabetic kidney evaluation - Urine ACR  Never done   FOOT EXAM  06/15/2023   Colonoscopy  02/12/2024   Diabetic kidney evaluation - eGFR measurement  03/12/2024   HEMOGLOBIN A1C  04/12/2024   INFLUENZA VACCINE  04/16/2024   OPHTHALMOLOGY EXAM  05/06/2024   Medicare Annual Wellness (AWV)  03/15/2025   Pneumococcal Vaccine: 50+ Years   Completed   Hepatitis C Screening  Completed   Zoster Vaccines- Shingrix  Completed   Hepatitis B Vaccines  Aged Out   HPV VACCINES  Aged Out   Meningococcal B Vaccine  Aged Out   DTaP/Tdap/Td  Discontinued   COVID-19 Vaccine  Discontinued    Health Maintenance  Health Maintenance Due  Topic Date Due   Diabetic kidney evaluation - Urine ACR  Never done   FOOT EXAM  06/15/2023   Colonoscopy  02/12/2024   Diabetic kidney evaluation - eGFR measurement  03/12/2024   Health Maintenance Items Addressed: Foot exam ordered and diabetic kidney ordered  Additional Screening:  Vision Screening: Recommended annual ophthalmology exams for early detection of glaucoma and other disorders of the eye. Would you like a referral to an eye doctor? No    Dental Screening: Recommended annual dental exams for proper oral hygiene  Community Resource Referral / Chronic Care Management: CRR required this visit?  No   CCM required this visit?  No   Plan:    I have personally reviewed and noted the following in the patient's chart:   Medical and social history Use of alcohol, tobacco or illicit drugs  Current medications and supplements including opioid prescriptions. Patient is not currently taking opioid prescriptions. Functional ability and status Nutritional status Physical activity Advanced directives List of other physicians Hospitalizations, surgeries, and ER visits in previous 12 months Vitals Screenings to include cognitive, depression, and falls Referrals and appointments  In addition, I have reviewed and discussed with patient certain preventive protocols, quality metrics, and best practice recommendations. A written personalized care plan for preventive services as well as general preventive health recommendations were provided to patient.   Lyle MARLA Right, [place]   03/15/2024   After Visit Summary: (MyChart) Due to this being a telephonic visit, the after visit summary with  patients personalized plan was offered to patient via MyChart   Notes: Nothing significant to report at this time.

## 2024-03-15 NOTE — Patient Instructions (Signed)
 [name] , Thank you for taking time out of your busy schedule to complete your Annual Wellness Visit with me. I enjoyed our conversation and look forward to speaking with you again next year. I, as well as your care team,  appreciate your ongoing commitment to your health goals. Please review the following plan we discussed and let me know if I can assist you in the future. Your Game plan/ To Do List    Referrals: If you haven't heard from the office you've been referred to, please reach out to them at the phone provided.  none Follow up Visits: Next Medicare AWV with our clinical staff: 03/16/2025   Have you seen your provider in the last 6 months (3 months if uncontrolled diabetes)? No Next Office Visit with your provider: 04/13/2024  Clinician Recommendations:  Aim for 30 minutes of exercise or brisk walking, 6-8 glasses of water, and 5 servings of fruits and vegetables each day.       This is a list of the screening recommended for you and due dates:  Health Maintenance  Topic Date Due   Yearly kidney health urinalysis for diabetes  Never done   Complete foot exam   06/15/2023   Colon Cancer Screening  02/12/2024   Yearly kidney function blood test for diabetes  03/12/2024   Hemoglobin A1C  04/12/2024   Flu Shot  04/16/2024   Eye exam for diabetics  05/06/2024   Medicare Annual Wellness Visit  12/14/2024   Pneumococcal Vaccine for age over 81  Completed   Hepatitis C Screening  Completed   Zoster (Shingles) Vaccine  Completed   Hepatitis B Vaccine  Aged Out   HPV Vaccine  Aged Out   Meningitis B Vaccine  Aged Out   DTaP/Tdap/Td vaccine  Discontinued   COVID-19 Vaccine  Discontinued    Advanced directives: (Declined) Advance directive discussed with you today. Even though you declined this today, please call our office should you change your mind, and we can give you the proper paperwork for you to fill out. Advance Care Planning is important because it:  [x]  Makes sure  you receive the medical care that is consistent with your values, goals, and preferences  [x]  It provides guidance to your family and loved ones and reduces their decisional burden about whether or not they are making the right decisions based on your wishes.  Follow the link provided in your after visit summary or read over the paperwork we have mailed to you to help you started getting your Advance Directives in place. If you need assistance in completing these, please reach out to us  so that we can help you!  See attachments for Preventive Care and Fall Prevention Tips.

## 2024-03-16 ENCOUNTER — Encounter: Payer: PPO | Admitting: Primary Care

## 2024-03-16 DIAGNOSIS — C001 Malignant neoplasm of external lower lip: Secondary | ICD-10-CM | POA: Diagnosis not present

## 2024-04-01 DIAGNOSIS — D045 Carcinoma in situ of skin of trunk: Secondary | ICD-10-CM | POA: Diagnosis not present

## 2024-04-01 DIAGNOSIS — D485 Neoplasm of uncertain behavior of skin: Secondary | ICD-10-CM | POA: Diagnosis not present

## 2024-04-01 DIAGNOSIS — D225 Melanocytic nevi of trunk: Secondary | ICD-10-CM | POA: Diagnosis not present

## 2024-04-09 ENCOUNTER — Other Ambulatory Visit: Payer: Self-pay | Admitting: Primary Care

## 2024-04-09 DIAGNOSIS — J302 Other seasonal allergic rhinitis: Secondary | ICD-10-CM

## 2024-04-13 ENCOUNTER — Encounter: Payer: Self-pay | Admitting: Primary Care

## 2024-04-13 ENCOUNTER — Ambulatory Visit (INDEPENDENT_AMBULATORY_CARE_PROVIDER_SITE_OTHER): Payer: PPO | Admitting: Primary Care

## 2024-04-13 VITALS — BP 124/76 | HR 59 | Temp 97.8°F | Ht 72.0 in | Wt 185.0 lb

## 2024-04-13 DIAGNOSIS — E785 Hyperlipidemia, unspecified: Secondary | ICD-10-CM | POA: Diagnosis not present

## 2024-04-13 DIAGNOSIS — Z0000 Encounter for general adult medical examination without abnormal findings: Secondary | ICD-10-CM

## 2024-04-13 DIAGNOSIS — Z794 Long term (current) use of insulin: Secondary | ICD-10-CM

## 2024-04-13 DIAGNOSIS — I10 Essential (primary) hypertension: Secondary | ICD-10-CM | POA: Diagnosis not present

## 2024-04-13 DIAGNOSIS — E1165 Type 2 diabetes mellitus with hyperglycemia: Secondary | ICD-10-CM

## 2024-04-13 LAB — COMPREHENSIVE METABOLIC PANEL WITH GFR
ALT: 10 U/L (ref 0–53)
AST: 12 U/L (ref 0–37)
Albumin: 4.3 g/dL (ref 3.5–5.2)
Alkaline Phosphatase: 42 U/L (ref 39–117)
BUN: 15 mg/dL (ref 6–23)
CO2: 28 meq/L (ref 19–32)
Calcium: 9.4 mg/dL (ref 8.4–10.5)
Chloride: 101 meq/L (ref 96–112)
Creatinine, Ser: 1 mg/dL (ref 0.40–1.50)
GFR: 72.72 mL/min (ref >60.00)
Glucose, Bld: 116 mg/dL — ABNORMAL HIGH (ref 70–99)
Potassium: 4 meq/L (ref 3.5–5.1)
Sodium: 140 meq/L (ref 135–145)
Total Bilirubin: 0.6 mg/dL (ref 0.2–1.2)
Total Protein: 6.6 g/dL (ref 6.0–8.3)

## 2024-04-13 LAB — HEMOGLOBIN A1C: Hgb A1c MFr Bld: 7.6 % — ABNORMAL HIGH (ref 4.6–6.5)

## 2024-04-13 LAB — LIPID PANEL
Cholesterol: 103 mg/dL (ref 0–200)
HDL: 34.4 mg/dL — ABNORMAL LOW (ref >39.00)
LDL Cholesterol: 45 mg/dL (ref 0–99)
NonHDL: 68.41
Total CHOL/HDL Ratio: 3
Triglycerides: 117 mg/dL (ref 0.0–149.0)
VLDL: 23.4 mg/dL (ref 0.0–40.0)

## 2024-04-13 LAB — MICROALBUMIN / CREATININE URINE RATIO
Creatinine,U: 88.6 mg/dL
Microalb Creat Ratio: 107.4 mg/g — ABNORMAL HIGH (ref 0.0–30.0)
Microalb, Ur: 9.5 mg/dL — ABNORMAL HIGH (ref 0.0–1.9)

## 2024-04-13 NOTE — Assessment & Plan Note (Addendum)
Repeat lipid panel pending. Continue simvastatin 20 mg daily.  

## 2024-04-13 NOTE — Assessment & Plan Note (Signed)
Controlled.  Continue amlodipine 10 mg daily, hydrochlorothiazide 25 mg daily, lisinopril 10 mg daily CMP pending.

## 2024-04-13 NOTE — Assessment & Plan Note (Signed)
 Repeat A1C pending.  Continue Glipizide  10 mg BID, Mounjaro  7.5 mg weekly, metformin  XR 1000 mg BID. Consider discontinuing glipizide . Await results.   He is mostly injecting Lantus  1 to 2 times daily depending on his glucose. He is skipping days.   Urine micro due and pending.  Follow up in 3-6 months.

## 2024-04-13 NOTE — Patient Instructions (Signed)
 Stop by the lab prior to leaving today. I will notify you of your results once received.   Please schedule a follow up visit for 6 months for a diabetes check.  It was a pleasure to see you today!

## 2024-04-13 NOTE — Assessment & Plan Note (Signed)
 Immunizations UTD. Colonoscopy due, he is aware and will call to schedule PSA screening discontinued given age  Discussed the importance of a healthy diet and regular exercise in order for weight loss, and to reduce the risk of further co-morbidity.  Exam stable. Labs pending.  Follow up in 1 year for repeat physical.

## 2024-04-13 NOTE — Progress Notes (Signed)
 Subjective:    Patient ID: Lawrence Parker, male    DOB: 1946-07-02, 78 y.o.   MRN: [id number]  HPI  Lawrence Parker is a very pleasant 78 y.o. male who presents today for complete physical and follow up of chronic conditions.  He would also like to discuss anxiety. Chronic for 6+ months, worse over the last several months. Symptoms include worrying, feeling overwhelmed, irritability, feeling anxious, mildly depressed. No prior treatment, he is asking for treatment today. He is not interested in therapy.   Immunizations:  -Shingles: Completed Shingrix series -Pneumonia: Completed Prevnar 20 in 2024  Diet: Fair diet.  Exercise: No regular exercise.  Eye exam: Completes annually  Dental exam: Completed 1 year ago    Colonoscopy: Completed in 2024, due May 2025.  Has not completed. He is aware that he is due.    BP Readings from Last 3 Encounters:  04/13/24 124/76  03/15/24 124/66  02/05/24 124/66         Review of Systems  Constitutional:  Negative for unexpected weight change.  HENT:  Negative for rhinorrhea.   Respiratory:  Negative for cough and shortness of breath.   Cardiovascular:  Negative for chest pain.  Gastrointestinal:  Negative for constipation and diarrhea.  Genitourinary:  Negative for difficulty urinating.  Musculoskeletal:  Negative for arthralgias.  Skin:  Negative for rash.  Allergic/Immunologic: Negative for environmental allergies.  Neurological:  Negative for dizziness and headaches.  Psychiatric/Behavioral:  The patient is nervous/anxious.        See HPI         Past Medical History:  Diagnosis Date   Ankle effusion, right 04/13/2018   Anxiety    takes Xanax  prn   Arthritis    neck   Back pain    deteriorating disc   Bilateral lower extremity edema 03/20/2017   Bladder neck obstruction    Colon polyps    Depression    hx of but doesn't take any medications   Diabetes mellitus    takes Actos ,Glipizide ,Metformin ,and Lantus     Diverticula of colon    Frontal headache 06/06/2021   History of kidney stones    also has one at present time    Hyperlipidemia    takes Zocor  daily   Hypertension    takes Amlodipine  daily and HCTZ daily   Neck pain    cervical spondylosis   Peyronie disease     Social History   Socioeconomic History   Marital status: Married    Spouse name: Not on file   Number of children: Not on file   Years of education: Not on file   Highest education level: 9th grade  Occupational History   Occupation: retired  Tobacco Use   Smoking status: Former    Current packs/day: 1.00    Types: Cigarettes   Smokeless tobacco: Never   Tobacco comments:    quit smoking 57yrs ago  Vaping Use   Vaping status: Never Used  Substance and Sexual Activity   Alcohol use: No   Drug use: No   Sexual activity: Not Currently  Other Topics Concern   Not on file  Social History Narrative   Married.   2 children. 2 grandchildren.   Retired. Drove trucks for 43 years.   Enjoys traveling, spending time with family.   Social Drivers of Corporate investment banker Strain: Low Risk  (03/15/2024)   Overall Financial Resource Strain (CARDIA)    Difficulty of Paying Living Expenses: Not  hard at all  Food Insecurity: No Food Insecurity (03/15/2024)   Hunger Vital Sign    Worried About Running Out of Food in the Last Year: Never true    Ran Out of Food in the Last Year: Never true  Transportation Needs: No Transportation Needs (03/15/2024)   PRAPARE - Administrator, Civil Service (Medical): No    Lack of Transportation (Non-Medical): No  Physical Activity: Inactive (03/15/2024)   Exercise Vital Sign    Days of Exercise per Week: 0 days    Minutes of Exercise per Session: 0 min  Stress: No Stress Concern Present (03/15/2024)   [employer] of Occupational Health - Occupational Stress Questionnaire    Feeling of Stress: Not at all  Social Connections: Moderately Isolated (03/15/2024)    Social Connection and Isolation Panel    Frequency of Communication with Friends and Family: More than three times a week    Frequency of Social Gatherings with Friends and Family: More than three times a week    Attends Religious Services: Never    Database administrator or Organizations: No    Attends Banker Meetings: Never    Marital Status: Married  Catering manager Violence: Not At Risk (03/15/2024)   Humiliation, Afraid, Rape, and Kick questionnaire    Fear of Current or Ex-Partner: No    Emotionally Abused: No    Physically Abused: No    Sexually Abused: No    Past Surgical History:  Procedure Laterality Date   CERVICAL SPINE SURGERY     CIRCUMCISION  20+yrs ago   COLONOSCOPY     NOSE SURGERY  20+yrs ago   SHOULDER SURGERY     right 20+yrs ago    Family History  Problem Relation Age of Onset   Cancer Mother    Diabetes Mother    Colon cancer Father    Cancer Father    Lung cancer Father    Colon cancer Sister    Cancer Sister    Colon cancer Sister    Diabetes Sister    Cancer Sister    Colon polyps Brother    Diabetes Brother    Anesthesia problems Neg Hx    Hypotension Neg Hx    Malignant hyperthermia Neg Hx    Pseudochol deficiency Neg Hx     No Known Allergies  Current Outpatient Medications on File Prior to Visit  Medication Sig Dispense Refill   Alcohol Swabs (DROPSAFE ALCOHOL PREP) 70 % PADS USE AS DIRECTED AS NEEDED 400 each 0   amLODipine  (NORVASC ) 10 MG tablet TAKE 1 TABLET BY MOUTH EVERY DAY FOR BLOOD PRESSURE 90 tablet 0   aspirin 325 MG tablet Take 325 mg by mouth daily.     Blood Glucose Calibration (ACCU-CHEK AVIVA) SOLN Use as instructed. 1 each 1   Blood Glucose Monitoring Suppl DEVI 1 each by Does not apply route in the morning, at noon, and at bedtime. May substitute to any manufacturer covered by patient's insurance. 1 each 0   Continuous Glucose Sensor (FREESTYLE LIBRE 3 SENSOR) MISC PLACE 1 SENSOR ON THE SKIN EVERY 14  DAYS. USE TO CHECK GLUCOSE CONTINUOUSLY 6 each 1   fluticasone  (FLONASE ) 50 MCG/ACT nasal spray PLACE 2 SPRAYS INTO BOTH NOSTRILS DAILY AS NEEDED FOR ALLERGIES OR RHINITIS. 48 mL 0   glipiZIDE  (GLUCOTROL ) 10 MG tablet TAKE 1 TABLET (10 MG TOTAL) BY MOUTH 2 (TWO) TIMES DAILY BEFORE A MEAL. FOR DIABETES. 180 tablet 1  Glucose Blood (BLOOD GLUCOSE TEST STRIPS) STRP 1 each by In Vitro route in the morning, at noon, and at bedtime. May substitute to any manufacturer covered by patient's insurance. 300 strip 0   hydrochlorothiazide  (HYDRODIURIL ) 25 MG tablet TAKE 1 TABLET BY MOUTH EVERY MORNING FOR BLOOD PRESSURE 90 tablet 0   insulin  glargine (LANTUS  SOLOSTAR) 100 UNIT/ML Solostar Pen INJECT 30 UNITS IN THE MORNING AND 10 UNITS IN THE EVENING FOR DIABETES. 30 mL 0   Insulin  Pen Needle 31G X 8 MM MISC Use as directed to injection insulin . 200 each 3   Lancets (ACCU-CHEK SOFT TOUCH) lancets Check blood sugar twice a day and as directed. Dx E11.9 300 each 2   Lancets Misc. MISC 1 each by Does not apply route in the morning, at noon, and at bedtime. May substitute to any manufacturer covered by patient's insurance. 300 each 0   lisinopril  (ZESTRIL ) 10 MG tablet TAKE 1 TABLET EVERY DAY 90 tablet 1   metFORMIN  (GLUCOPHAGE -XR) 500 MG 24 hr tablet TAKE 2 TABLETS BY MOUTH TWICE A DAY WITH A MEAL FOR DIABETES 360 tablet 1   simvastatin  (ZOCOR ) 20 MG tablet TAKE 1 TABLET BY MOUTH EVERY DAY FOR CHOLESTEROL 90 tablet 0   tirzepatide  (MOUNJARO ) 7.5 MG/0.5ML Pen INJECT 7.5 MG INTO THE SKIN ONCE A WEEK. FOR DIABETES. 6 mL 0   No current facility-administered medications on file prior to visit.    BP 124/76   Pulse (!) 59   Temp 97.8 F (36.6 C) (Temporal)   Ht 6' (1.829 m)   Wt 185 lb (83.9 kg)   SpO2 98%   BMI 25.09 kg/m  Objective:   Physical Exam HENT:     Right Ear: Tympanic membrane and ear canal normal.     Left Ear: Tympanic membrane and ear canal normal.  Eyes:     Pupils: Pupils are equal,  round, and reactive to light.  Cardiovascular:     Rate and Rhythm: Normal rate and regular rhythm.  Pulmonary:     Effort: Pulmonary effort is normal.     Breath sounds: Normal breath sounds.  Abdominal:     General: Bowel sounds are normal.     Palpations: Abdomen is soft.     Tenderness: There is no abdominal tenderness.  Musculoskeletal:        General: Normal range of motion.     Cervical back: Neck supple.  Skin:    General: Skin is warm and dry.  Neurological:     Mental Status: He is alert and oriented to person, place, and time.     Cranial Nerves: No cranial nerve deficit.     Deep Tendon Reflexes:     Reflex Scores:      Patellar reflexes are 2+ on the right side and 2+ on the left side. Psychiatric:        Mood and Affect: Mood normal.           Assessment & Plan:  Preventative health care Assessment & Plan: Immunizations UTD. Colonoscopy due, he is aware and will call to schedule PSA screening discontinued given age  Discussed the importance of a healthy diet and regular exercise in order for weight loss, and to reduce the risk of further co-morbidity.  Exam stable. Labs pending.  Follow up in 1 year for repeat physical.    Essential hypertension Assessment & Plan: Controlled.  Continue amlodipine  10 mg daily, hydrochlorothiazide  25 mg daily, lisinopril  10 mg daily. CMP pending.  Type 2 diabetes mellitus with hyperglycemia, with long-term current use of insulin  (HCC) Assessment & Plan: Repeat A1C pending.  Continue Glipizide  10 mg BID, Mounjaro  7.5 mg weekly, metformin  XR 1000 mg BID. Consider discontinuing glipizide . Await results.   He is mostly injecting Lantus  1 to 2 times daily depending on his glucose. He is skipping days.   Urine micro due and pending.  Follow up in 3-6 months.  Orders: -     Microalbumin / creatinine urine ratio -     Hemoglobin A1c  Hyperlipidemia, unspecified hyperlipidemia type Assessment & Plan: Repeat  lipid panel pending.  Continue simvastatin  20 mg daily.  Orders: -     Lipid panel -     Comprehensive metabolic panel with GFR        Comer MARLA Gaskins, NP

## 2024-04-14 ENCOUNTER — Other Ambulatory Visit: Payer: Self-pay | Admitting: Primary Care

## 2024-04-14 ENCOUNTER — Ambulatory Visit: Payer: Self-pay | Admitting: Primary Care

## 2024-04-14 ENCOUNTER — Telehealth: Payer: Self-pay | Admitting: Primary Care

## 2024-04-14 DIAGNOSIS — F411 Generalized anxiety disorder: Secondary | ICD-10-CM | POA: Insufficient documentation

## 2024-04-14 DIAGNOSIS — E1165 Type 2 diabetes mellitus with hyperglycemia: Secondary | ICD-10-CM

## 2024-04-14 MED ORDER — ESCITALOPRAM OXALATE 10 MG PO TABS: 10.0000 mg | ORAL_TABLET | Freq: Every day | ORAL | 0 refills | Status: DC

## 2024-04-14 NOTE — Telephone Encounter (Signed)
 Pt came by office stating his visited his pharmacy to pick up a rx for his anxiety. Pt states when he saw Clark on 7/29 for his cpe, he discussed starting the rx. Didn't see where any rx was sent on on 7/29. Please advise. Preferred pharmacy is [facility] in [place]. Call back # [phone]

## 2024-04-14 NOTE — Assessment & Plan Note (Signed)
 Discussed options, he prefers medication treatment.  Start Lexapro  5 mg daily x 1 week, then increase to 10 mg daily thereafter.  Follow-up in 1 month.

## 2024-04-14 NOTE — Telephone Encounter (Signed)
 Please apologize to patient on my behalf.  I forgot to send a prescription to the pharmacy.  Sending a prescription now for Lexapro  10 mg tablets.  -Take 1/2 tablet once daily for 1 week, then increase to 1 full tablet daily thereafter.  I will plan to follow-up with him in 1 month.  We can do a virtual visit or in person visit.  Whichever he prefers.

## 2024-04-14 NOTE — Telephone Encounter (Signed)
 Called patient and reviewed all information. Patient verbalized understanding. Will call if any further questions.

## 2024-04-15 ENCOUNTER — Other Ambulatory Visit: Payer: Self-pay | Admitting: Primary Care

## 2024-04-15 DIAGNOSIS — Z794 Long term (current) use of insulin: Secondary | ICD-10-CM

## 2024-04-15 DIAGNOSIS — D045 Carcinoma in situ of skin of trunk: Secondary | ICD-10-CM | POA: Diagnosis not present

## 2024-04-15 DIAGNOSIS — D044 Carcinoma in situ of skin of scalp and neck: Secondary | ICD-10-CM | POA: Diagnosis not present

## 2024-04-15 DIAGNOSIS — D0461 Carcinoma in situ of skin of right upper limb, including shoulder: Secondary | ICD-10-CM | POA: Diagnosis not present

## 2024-04-15 DIAGNOSIS — L570 Actinic keratosis: Secondary | ICD-10-CM | POA: Diagnosis not present

## 2024-04-16 ENCOUNTER — Telehealth: Payer: Self-pay | Admitting: Internal Medicine

## 2024-04-16 ENCOUNTER — Encounter: Payer: Self-pay | Admitting: Internal Medicine

## 2024-05-05 ENCOUNTER — Other Ambulatory Visit: Payer: Self-pay | Admitting: Primary Care

## 2024-05-05 DIAGNOSIS — I10 Essential (primary) hypertension: Secondary | ICD-10-CM

## 2024-05-05 DIAGNOSIS — E785 Hyperlipidemia, unspecified: Secondary | ICD-10-CM

## 2024-05-07 DIAGNOSIS — H40051 Ocular hypertension, right eye: Secondary | ICD-10-CM | POA: Diagnosis not present

## 2024-05-07 DIAGNOSIS — E113293 Type 2 diabetes mellitus with mild nonproliferative diabetic retinopathy without macular edema, bilateral: Secondary | ICD-10-CM | POA: Diagnosis not present

## 2024-05-07 DIAGNOSIS — Z961 Presence of intraocular lens: Secondary | ICD-10-CM | POA: Diagnosis not present

## 2024-05-07 DIAGNOSIS — H43813 Vitreous degeneration, bilateral: Secondary | ICD-10-CM | POA: Diagnosis not present

## 2024-05-07 LAB — HM DIABETES EYE EXAM

## 2024-05-19 ENCOUNTER — Telehealth: Admitting: Primary Care

## 2024-05-19 ENCOUNTER — Other Ambulatory Visit: Payer: Self-pay | Admitting: Primary Care

## 2024-05-19 ENCOUNTER — Encounter: Payer: Self-pay | Admitting: Primary Care

## 2024-05-19 DIAGNOSIS — G4700 Insomnia, unspecified: Secondary | ICD-10-CM | POA: Diagnosis not present

## 2024-05-19 DIAGNOSIS — F411 Generalized anxiety disorder: Secondary | ICD-10-CM

## 2024-05-19 DIAGNOSIS — E119 Type 2 diabetes mellitus without complications: Secondary | ICD-10-CM

## 2024-05-19 MED ORDER — TRAZODONE HCL 50 MG PO TABS: 25.0000 mg | ORAL_TABLET | Freq: Every day | ORAL | 0 refills | Status: DC

## 2024-05-19 NOTE — Assessment & Plan Note (Signed)
 Improving.  Continue Lexapro  10 mg daily for now.

## 2024-05-19 NOTE — Patient Instructions (Signed)
 Start trazodone  50 mg for trouble sleeping.  Take 1 pill by mouth every evening at bedtime.  Continue Lexapro  10 mg daily for anxiety.  Please update me in a few weeks as discussed.

## 2024-05-19 NOTE — Progress Notes (Signed)
 Patient ID: Lawrence Parker, male    DOB: [birth year], 78 y.o.   MRN: [id number]  Virtual visit completed through caregility, a video enabled telemedicine application. Due to national recommendations of social distancing due to COVID-19, a virtual visit is felt to be most appropriate for this patient at this time. Reviewed limitations, risks, security and privacy concerns of performing a virtual visit and the availability of in person appointments. I also reviewed that there may be a patient responsible charge related to this service. The patient agreed to proceed.   Patient location: home Provider location: [place] [facility], office Persons participating in this virtual visit: patient, provider   If any vitals were documented, they were collected by patient at home unless specified below.    There were no vitals taken for this visit.   CC: Follow up for Anxiety Subjective:   HPI: Lawrence Parker is a 78 y.o. male with a history of anxiety, depression, hypertension, PAD, type 2 diabetes presenting on 05/19/2024 for Anxiety  He was last evaluated on 03/24/2024 for his annual physical when he mentioned worsening chronic anxiety over the last 6 months.  Symptoms included feeling anxious, irritable, feeling overwhelmed, feeling down, worrying.  He declined therapy so he is initiated on Lexapro  5 mg daily x 1 week, then 10 mg daily thereafter.  Since his last visit he's feeling slightly better. Positive effects include feeling less anxious, worrying less, and feeling less irritable.   He continues to have difficulty falling asleep. He will go to bed around 9 pm, will lay awake until 4 am most every night. He will fall asleep around 4 am and wake up around 6 am to take his granddaughter to school. He will come back home and nap for 1-2 hours daily.   He drinks coffee, last cup is around 11 am. Before going he watches TV and will sometimes plays on his phone. If he cannot fall asleep he will  watch TV or play on his phone. He's tried melatonin in the past without improvement.       Relevant past medical, surgical, family and social history reviewed and updated as indicated. Interim medical history since our last visit reviewed. Allergies and medications reviewed and updated. Outpatient Medications Prior to Visit  Medication Sig Dispense Refill   Alcohol Swabs (DROPSAFE ALCOHOL PREP) 70 % PADS USE AS DIRECTED AS NEEDED 400 each 0   amLODipine  (NORVASC ) 10 MG tablet TAKE 1 TABLET BY MOUTH EVERY DAY FOR BLOOD PRESSURE 90 tablet 2   aspirin 325 MG tablet Take 325 mg by mouth daily.     Blood Glucose Calibration (ACCU-CHEK AVIVA) SOLN Use as instructed. 1 each 1   Blood Glucose Monitoring Suppl DEVI 1 each by Does not apply route in the morning, at noon, and at bedtime. May substitute to any manufacturer covered by patient's insurance. 1 each 0   Continuous Glucose Sensor (FREESTYLE LIBRE 3 SENSOR) MISC PLACE 1 SENSOR ON THE SKIN EVERY 14 DAYS. USE TO CHECK GLUCOSE CONTINUOUSLY 6 each 1   escitalopram  (LEXAPRO ) 10 MG tablet Take 1 tablet (10 mg total) by mouth daily. For anxiety 90 tablet 0   fluticasone  (FLONASE ) 50 MCG/ACT nasal spray PLACE 2 SPRAYS INTO BOTH NOSTRILS DAILY AS NEEDED FOR ALLERGIES OR RHINITIS. 48 mL 0   glipiZIDE  (GLUCOTROL ) 10 MG tablet TAKE 1 TABLET (10 MG TOTAL) BY MOUTH 2 (TWO) TIMES DAILY BEFORE A MEAL. FOR DIABETES. 180 tablet 1   Glucose Blood (  BLOOD GLUCOSE TEST STRIPS) STRP 1 each by In Vitro route in the morning, at noon, and at bedtime. May substitute to any manufacturer covered by patient's insurance. 300 strip 0   hydrochlorothiazide  (HYDRODIURIL ) 25 MG tablet TAKE 1 TABLET BY MOUTH EVERY MORNING FOR BLOOD PRESSURE 90 tablet 2   insulin  glargine (LANTUS  SOLOSTAR) 100 UNIT/ML Solostar Pen INJECT 30 UNITS IN THE MORNING AND 10 UNITS IN THE EVENING FOR DIABETES. 30 mL 0   Insulin  Pen Needle 31G X 8 MM MISC Use as directed to injection insulin . 200 each 3    Lancets (ACCU-CHEK SOFT TOUCH) lancets Check blood sugar twice a day and as directed. Dx E11.9 300 each 2   Lancets Misc. MISC 1 each by Does not apply route in the morning, at noon, and at bedtime. May substitute to any manufacturer covered by patient's insurance. 300 each 0   lisinopril  (ZESTRIL ) 10 MG tablet TAKE 1 TABLET EVERY DAY 90 tablet 1   metFORMIN  (GLUCOPHAGE -XR) 500 MG 24 hr tablet TAKE 2 TABLETS BY MOUTH TWICE A DAY WITH A MEAL FOR DIABETES 360 tablet 1   simvastatin  (ZOCOR ) 20 MG tablet TAKE 1 TABLET BY MOUTH EVERY DAY FOR CHOLESTEROL 90 tablet 2   tirzepatide  (MOUNJARO ) 7.5 MG/0.5ML Pen INJECT 7.5 MG INTO THE SKIN ONCE A WEEK. FOR DIABETES. 6 mL 1   No facility-administered medications prior to visit.     Per HPI unless specifically indicated in ROS section below Review of Systems  Respiratory:  Negative for shortness of breath.   Cardiovascular:  Negative for chest pain.  Psychiatric/Behavioral:  Positive for sleep disturbance. Negative for suicidal ideas. The patient is nervous/anxious.    Objective:  There were no vitals taken for this visit.  Wt Readings from Last 3 Encounters:  04/13/24 185 lb (83.9 kg)  03/15/24 180 lb (81.6 kg)  02/05/24 190 lb (86.2 kg)       Physical exam: General: Alert and oriented x 3, no distress, does not appear sickly  Pulmonary: Speaks in complete sentences without increased work of breathing, no cough during visit.  Psychiatric: Normal mood, thought content, and behavior.     Results for orders placed or performed in visit on 05/10/24  HM DIABETES EYE EXAM   Collection Time: 05/07/24  2:40 PM  Result Value Ref Range   HM Diabetic Eye Exam No Retinopathy No Retinopathy   Assessment & Plan:   Problem List Items Addressed This Visit       Other   GAD (generalized anxiety disorder) - Primary   Improving.  Continue Lexapro  10 mg daily for now.      Relevant Medications   traZODone  (DESYREL ) 50 MG tablet   Insomnia    Unclear etiology.  We discussed healthy bedtime routine such as no screen time 1 hour prior to bedtime. We also discussed to get out of bed if he cannot fall asleep within 30 minutes.  Start trazodone  50 mg at bedtime. Continue Lexapro  10 mg daily.  He will update me in a few weeks.      Relevant Medications   traZODone  (DESYREL ) 50 MG tablet     Meds ordered this encounter  Medications   traZODone  (DESYREL ) 50 MG tablet    Sig: Take 0.5-1 tablets (25-50 mg total) by mouth at bedtime. For sleep    Dispense:  30 tablet    Refill:  0    Supervising Provider:   BEDSOLE, AMY E [[id number]]   No orders of the  defined types were placed in this encounter.   I discussed the assessment and treatment plan with the patient. The patient was provided an opportunity to ask questions and all were answered. The patient agreed with the plan and demonstrated an understanding of the instructions. The patient was advised to call back or seek an in-person evaluation if the symptoms worsen or if the condition fails to improve as anticipated.  Follow up plan:  Start trazodone  50 mg for trouble sleeping.  Take 1 pill by mouth every evening at bedtime.  Continue Lexapro  10 mg daily for anxiety.  Please update me in a few weeks as discussed.  Dale Ribeiro K Luisa Louk, NP

## 2024-05-19 NOTE — Assessment & Plan Note (Signed)
 Unclear etiology.  We discussed healthy bedtime routine such as no screen time 1 hour prior to bedtime. We also discussed to get out of bed if he cannot fall asleep within 30 minutes.  Start trazodone  50 mg at bedtime. Continue Lexapro  10 mg daily.  He will update me in a few weeks.

## 2024-06-05 ENCOUNTER — Other Ambulatory Visit: Payer: Self-pay | Admitting: Primary Care

## 2024-06-05 DIAGNOSIS — Z794 Long term (current) use of insulin: Secondary | ICD-10-CM

## 2024-06-10 ENCOUNTER — Other Ambulatory Visit: Payer: Self-pay | Admitting: Primary Care

## 2024-06-10 DIAGNOSIS — G4700 Insomnia, unspecified: Secondary | ICD-10-CM

## 2024-06-10 NOTE — Telephone Encounter (Signed)
 Spoke with pt asking how he's doing with trazodone . States he's doing good and it's helping him sleep.

## 2024-06-10 NOTE — Telephone Encounter (Signed)
 Please call patient:  How is he doing on the Trazodone  medication for sleep we started last month?

## 2024-06-11 NOTE — Telephone Encounter (Signed)
 Noted.  Requested Prescriptions   Signed Prescriptions Disp Refills   traZODone  (DESYREL ) 50 MG tablet 90 tablet 2    Sig: TAKE 0.5-1 TABLETS (25-50 MG TOTAL) BY MOUTH AT BEDTIME. FOR SLEEP    Authorizing Provider: Thorn Demas K   Refill(s) sent to pharmacy.

## 2024-06-21 ENCOUNTER — Ambulatory Visit

## 2024-06-21 VITALS — Ht 72.0 in | Wt 180.0 lb

## 2024-06-21 DIAGNOSIS — Z800 Family history of malignant neoplasm of digestive organs: Secondary | ICD-10-CM

## 2024-06-21 DIAGNOSIS — Z860100 Personal history of colon polyps, unspecified: Secondary | ICD-10-CM

## 2024-06-21 MED ORDER — PEG 3350-KCL-NA BICARB-NACL 420 G PO SOLR: 4000.0000 mL | Freq: Once | ORAL | 0 refills | Status: AC

## 2024-06-21 NOTE — Progress Notes (Signed)
 No egg or soy allergy known to patient  No issues known to pt with past sedation with any surgeries or procedures Patient denies ever being told they had issues or difficulty with intubation  No FH of Malignant Hyperthermia  Pt is not on diet pills- Takes Mounjora, Hold instructions provided  Pt is not on  home 02  Pt is not on blood thinners  Pt denies issues with constipation  No A fib or A flutter Have any cardiac testing pending--No Pt can ambulate  Pt denies use of chewing tobacco Discussed diabetic I weight loss medication holds Discussed NSAID holds Checked BMI Pt instructed to use Singlecare.com or GoodRx for a price reduction on prep  Patient's chart reviewed by Lawrence Parker CNRA prior to previsit and patient appropriate for the [facility].  Pre visit completed and red dot placed by patient's name on their procedure day (on provider's schedule).

## 2024-06-22 ENCOUNTER — Encounter: Payer: Self-pay | Admitting: Internal Medicine

## 2024-06-30 ENCOUNTER — Other Ambulatory Visit: Payer: Self-pay | Admitting: Primary Care

## 2024-06-30 DIAGNOSIS — J302 Other seasonal allergic rhinitis: Secondary | ICD-10-CM

## 2024-07-05 ENCOUNTER — Ambulatory Visit (AMBULATORY_SURGERY_CENTER): Admitting: Internal Medicine

## 2024-07-05 ENCOUNTER — Other Ambulatory Visit: Payer: Self-pay | Admitting: Primary Care

## 2024-07-05 ENCOUNTER — Encounter: Payer: Self-pay | Admitting: Internal Medicine

## 2024-07-05 VITALS — BP 131/81 | HR 56 | Temp 98.1°F | Resp 13 | Ht 72.0 in | Wt 180.0 lb

## 2024-07-05 DIAGNOSIS — K635 Polyp of colon: Secondary | ICD-10-CM

## 2024-07-05 DIAGNOSIS — Z860100 Personal history of colon polyps, unspecified: Secondary | ICD-10-CM

## 2024-07-05 DIAGNOSIS — Z800 Family history of malignant neoplasm of digestive organs: Secondary | ICD-10-CM

## 2024-07-05 DIAGNOSIS — K648 Other hemorrhoids: Secondary | ICD-10-CM | POA: Diagnosis not present

## 2024-07-05 DIAGNOSIS — D122 Benign neoplasm of ascending colon: Secondary | ICD-10-CM

## 2024-07-05 DIAGNOSIS — Z860101 Personal history of adenomatous and serrated colon polyps: Secondary | ICD-10-CM | POA: Diagnosis not present

## 2024-07-05 DIAGNOSIS — Z1211 Encounter for screening for malignant neoplasm of colon: Secondary | ICD-10-CM | POA: Diagnosis not present

## 2024-07-05 DIAGNOSIS — E119 Type 2 diabetes mellitus without complications: Secondary | ICD-10-CM | POA: Diagnosis not present

## 2024-07-05 DIAGNOSIS — F419 Anxiety disorder, unspecified: Secondary | ICD-10-CM | POA: Diagnosis not present

## 2024-07-05 DIAGNOSIS — K5730 Diverticulosis of large intestine without perforation or abscess without bleeding: Secondary | ICD-10-CM

## 2024-07-05 DIAGNOSIS — I10 Essential (primary) hypertension: Secondary | ICD-10-CM | POA: Diagnosis not present

## 2024-07-05 DIAGNOSIS — F411 Generalized anxiety disorder: Secondary | ICD-10-CM

## 2024-07-05 MED ORDER — SODIUM CHLORIDE 0.9 % IV SOLN: 500.0000 mL | Freq: Once | INTRAVENOUS | Status: DC

## 2024-07-05 NOTE — Patient Instructions (Signed)

## 2024-07-05 NOTE — Progress Notes (Signed)
 Called to room to assist during endoscopic procedure.  Patient ID and intended procedure confirmed with present staff. Received instructions for my participation in the procedure from the performing physician.

## 2024-07-05 NOTE — Progress Notes (Signed)
 Transferred to PACU via stretcher, arousing, VSS.

## 2024-07-05 NOTE — Progress Notes (Signed)
 HISTORY OF PRESENT ILLNESS:  Lawrence Parker is a 78 y.o. male with a history of multiple adenomatous colon polyps.  Now for surveillance colonoscopy  REVIEW OF SYSTEMS:  All non-GI ROS negative except for  Past Medical History:  Diagnosis Date   Ankle effusion, right 04/13/2018   Anxiety    takes Xanax  prn   Arthritis    neck   Back pain    deteriorating disc   Bilateral lower extremity edema 03/20/2017   Bladder neck obstruction    Colon polyps    Depression    hx of but doesn't take any medications   Diabetes mellitus    takes Actos ,Glipizide ,Metformin ,and Lantus    Diverticula of colon    Frontal headache 06/06/2021   History of kidney stones    also has one at present time    Hyperlipidemia    takes Zocor  daily   Hypertension    takes Amlodipine  daily and HCTZ daily   Neck pain    cervical spondylosis   Peyronie disease     Past Surgical History:  Procedure Laterality Date   CERVICAL SPINE SURGERY     CIRCUMCISION  20+yrs ago   COLONOSCOPY     NOSE SURGERY  20+yrs ago   SHOULDER SURGERY     right 20+yrs ago    Social History Lawrence Parker  reports that he has quit smoking. His smoking use included cigarettes. He has never used smokeless tobacco. He reports that he does not drink alcohol and does not use drugs.  family history includes Cancer in his father, mother, sister, and sister; Colon cancer in his father, sister, and sister; Colon polyps in his brother; Diabetes in his brother, mother, and sister; Lung cancer in his father.  No Known Allergies     PHYSICAL EXAMINATION: Vital signs: BP 137/61   Pulse 69   Temp 98.1 F (36.7 C) (Skin)   Ht 6' (1.829 m)   Wt 180 lb (81.6 kg)   SpO2 98%   BMI 24.41 kg/m  General: Well-developed, well-nourished, no acute distress HEENT: Sclerae are anicteric, conjunctiva pink. Oral mucosa intact Lungs: Clear Heart: Regular Abdomen: soft, nontender, nondistended, no obvious ascites, no peritoneal signs,  normal bowel sounds. No organomegaly. Extremities: No edema Psychiatric: alert and oriented x3. Cooperative     ASSESSMENT:  History of multiple adenomatous polyps   PLAN:   Surveillance colonoscopy

## 2024-07-05 NOTE — Progress Notes (Signed)
 Pt's states no medical or surgical changes since previsit or office visit.

## 2024-07-05 NOTE — Op Note (Signed)
 [facility] Patient Name: Lawrence Parker Procedure Date: 07/05/2024 8:43 AM MRN: [id number] Endoscopist: Norleen SAILOR. Abran , MD, [id number] Age: 78 Referring MD:  Date of Birth: 1945/10/21 Gender: Male Account #: [id number] Procedure:                Colonoscopy with cold snare polypectomy x 2 Indications:              High risk colon cancer surveillance: Personal                            history of multiple (3 or more) adenomas. Previous                            examinations 2006 (elsewhere) and 2024 Medicines:                Monitored Anesthesia Care Procedure:                Pre-Anesthesia Assessment:                           - Prior to the procedure, a History and Physical                            was performed, and patient medications and                            allergies were reviewed. The patient's tolerance of                            previous anesthesia was also reviewed. The risks                            and benefits of the procedure and the sedation                            options and risks were discussed with the patient.                            All questions were answered, and informed consent                            was obtained. Prior Anticoagulants: The patient has                            taken no anticoagulant or antiplatelet agents. ASA                            Grade Assessment: II - A patient with mild systemic                            disease. After reviewing the risks and benefits,                            the patient was deemed in satisfactory condition to  undergo the procedure.                           After obtaining informed consent, the colonoscope                            was passed under direct vision. Throughout the                            procedure, the patient's blood pressure, pulse, and                            oxygen saturations were monitored continuously. The                             Olympus CF-HQ190L ([id number]) Colonoscope was                            introduced through the anus and advanced to the the                            cecum, identified by appendiceal orifice and                            ileocecal valve. The ileocecal valve, appendiceal                            orifice, and rectum were photographed. The quality                            of the bowel preparation was adequate to identify                            polyps. There was vegetative material in the right                            colon. The remainder of the colon was well-prepped.                            The colonoscopy was performed without difficulty.                            The patient tolerated the procedure well. The bowel                            preparation used was SUPREP via split dose                            instruction. Scope In: 8:55:35 AM Scope Out: 9:11:06 AM Scope Withdrawal Time: 0 hours 10 minutes 44 seconds  Total Procedure Duration: 0 hours 15 minutes 31 seconds  Findings:                 Two polyps were found in the ascending colon. The  polyps were 2 to 3 mm in size. These polyps were                            removed with a cold snare. Resection and retrieval                            were complete.                           Many diverticula were found in the left colon.                           Internal hemorrhoids were found during retroflexion.                           The exam was otherwise without abnormality on                            direct and retroflexion views. Complications:            No immediate complications. Estimated blood loss:                            None. Estimated Blood Loss:     Estimated blood loss: none. Impression:               - Two 2 to 3 mm polyps in the ascending colon,                            removed with a cold snare. Resected and retrieved.                           -  Diverticulosis in the left colon.                           - Internal hemorrhoids.                           - The examination was otherwise normal on direct                            and retroflexion views. Recommendation:           - Repeat colonoscopy is not recommended for                            surveillance.                           - Patient has a contact number available for                            emergencies. The signs and symptoms of potential                            delayed complications were discussed with the  patient. Return to normal activities tomorrow.                            Written discharge instructions were provided to the                            patient.                           - Resume previous diet.                           - Continue present medications.                           - Await pathology results. Norleen SAILOR. Abran, MD 07/05/2024 9:18:25 AM This report has been signed electronically.

## 2024-07-06 ENCOUNTER — Telehealth: Payer: Self-pay

## 2024-07-06 NOTE — Telephone Encounter (Signed)
  Follow up Call-     07/05/2024    8:05 AM 02/12/2023    7:15 AM  Call back number  Post procedure Call Back phone  # [phone]  Permission to leave phone message Yes Yes     Patient questions:  Do you have a fever, pain , or abdominal swelling? No. Pain Score  0 *  Have you tolerated food without any problems? Yes.    Have you been able to return to your normal activities? Yes.    Do you have any questions about your discharge instructions: Diet   No. Medications  No. Follow up visit  No.  Do you have questions or concerns about your Care? No.  Actions: * If pain score is 4 or above: No action needed, pain <4.

## 2024-07-07 ENCOUNTER — Ambulatory Visit: Payer: Self-pay | Admitting: Internal Medicine

## 2024-07-07 LAB — SURGICAL PATHOLOGY

## 2024-08-01 ENCOUNTER — Other Ambulatory Visit: Payer: Self-pay | Admitting: Primary Care

## 2024-08-01 DIAGNOSIS — Z794 Long term (current) use of insulin: Secondary | ICD-10-CM

## 2024-08-18 DIAGNOSIS — D2261 Melanocytic nevi of right upper limb, including shoulder: Secondary | ICD-10-CM | POA: Diagnosis not present

## 2024-08-18 DIAGNOSIS — D225 Melanocytic nevi of trunk: Secondary | ICD-10-CM | POA: Diagnosis not present

## 2024-08-18 DIAGNOSIS — D2262 Melanocytic nevi of left upper limb, including shoulder: Secondary | ICD-10-CM | POA: Diagnosis not present

## 2024-08-18 DIAGNOSIS — Z85828 Personal history of other malignant neoplasm of skin: Secondary | ICD-10-CM | POA: Diagnosis not present

## 2024-08-18 DIAGNOSIS — L570 Actinic keratosis: Secondary | ICD-10-CM | POA: Diagnosis not present

## 2024-08-18 DIAGNOSIS — D485 Neoplasm of uncertain behavior of skin: Secondary | ICD-10-CM | POA: Diagnosis not present

## 2024-08-18 DIAGNOSIS — D2272 Melanocytic nevi of left lower limb, including hip: Secondary | ICD-10-CM | POA: Diagnosis not present

## 2024-09-17 ENCOUNTER — Other Ambulatory Visit: Payer: Self-pay | Admitting: Primary Care

## 2024-09-17 DIAGNOSIS — J302 Other seasonal allergic rhinitis: Secondary | ICD-10-CM

## 2024-10-14 ENCOUNTER — Ambulatory Visit: Admitting: Primary Care

## 2024-10-27 ENCOUNTER — Ambulatory Visit: Admitting: Primary Care

## 2025-03-16 ENCOUNTER — Ambulatory Visit
